# Patient Record
Sex: Male | Born: 1960 | Race: White | Hispanic: No | Marital: Married | Smoking: Former smoker
Health system: Southern US, Community
[De-identification: ages and names within clinical notes are randomized; demographics above are authoritative.]

## PROBLEM LIST (undated history)

## (undated) DIAGNOSIS — Z87442 Personal history of urinary calculi: Secondary | ICD-10-CM

## (undated) DIAGNOSIS — F419 Anxiety disorder, unspecified: Secondary | ICD-10-CM

## (undated) DIAGNOSIS — G4730 Sleep apnea, unspecified: Secondary | ICD-10-CM

## (undated) DIAGNOSIS — I10 Essential (primary) hypertension: Secondary | ICD-10-CM

## (undated) DIAGNOSIS — F329 Major depressive disorder, single episode, unspecified: Secondary | ICD-10-CM

## (undated) DIAGNOSIS — M1990 Unspecified osteoarthritis, unspecified site: Secondary | ICD-10-CM

## (undated) DIAGNOSIS — F32A Depression, unspecified: Secondary | ICD-10-CM

## (undated) HISTORY — PX: HERNIA REPAIR: SHX51

## (undated) HISTORY — PX: CARDIAC CATHETERIZATION: SHX172

## (undated) HISTORY — PX: SHOULDER ARTHROSCOPY DISTAL CLAVICLE EXCISION AND OPEN ROTATOR CUFF REPAIR: SHX2396

## (undated) HISTORY — PX: CHOLECYSTECTOMY: SHX55

## (undated) HISTORY — PX: TOTAL HIP ARTHROPLASTY: SHX124

## (undated) HISTORY — PX: JOINT REPLACEMENT: SHX530

## (undated) HISTORY — PX: BACK SURGERY: SHX140

## (undated) HISTORY — PX: FINGER SURGERY: SHX640

---

## 2003-01-10 ENCOUNTER — Emergency Department (HOSPITAL_COMMUNITY): Admission: EM | Admit: 2003-01-10 | Discharge: 2003-01-10 | Payer: Self-pay | Admitting: Emergency Medicine

## 2003-03-23 ENCOUNTER — Observation Stay (HOSPITAL_COMMUNITY): Admission: EM | Admit: 2003-03-23 | Discharge: 2003-03-24 | Payer: Self-pay | Admitting: Cardiology

## 2007-09-20 ENCOUNTER — Emergency Department: Payer: Self-pay | Admitting: Emergency Medicine

## 2008-01-20 ENCOUNTER — Ambulatory Visit (HOSPITAL_COMMUNITY): Admission: RE | Admit: 2008-01-20 | Discharge: 2008-01-21 | Payer: Self-pay | Admitting: Orthopedic Surgery

## 2009-01-22 ENCOUNTER — Encounter: Admission: RE | Admit: 2009-01-22 | Discharge: 2009-01-22 | Payer: Self-pay

## 2009-01-31 ENCOUNTER — Encounter: Admission: RE | Admit: 2009-01-31 | Discharge: 2009-01-31 | Payer: Self-pay | Admitting: Specialist

## 2009-02-08 ENCOUNTER — Ambulatory Visit (HOSPITAL_COMMUNITY): Admission: RE | Admit: 2009-02-08 | Discharge: 2009-02-08 | Payer: Self-pay | Admitting: Orthopedic Surgery

## 2009-08-11 HISTORY — PX: TOTAL HIP ARTHROPLASTY: SHX124

## 2009-12-31 ENCOUNTER — Inpatient Hospital Stay (HOSPITAL_COMMUNITY): Admission: RE | Admit: 2009-12-31 | Discharge: 2010-01-03 | Payer: Self-pay | Admitting: Orthopedic Surgery

## 2010-10-28 LAB — BASIC METABOLIC PANEL
BUN: 12 mg/dL (ref 6–23)
BUN: 15 mg/dL (ref 6–23)
CO2: 25 mEq/L (ref 19–32)
CO2: 27 mEq/L (ref 19–32)
Calcium: 9 mg/dL (ref 8.4–10.5)
Creatinine, Ser: 1.01 mg/dL (ref 0.4–1.5)
Creatinine, Ser: 1.11 mg/dL (ref 0.4–1.5)
GFR calc Af Amer: >60 mL/min (ref >60)
GFR calc non Af Amer: >60 mL/min (ref >60)
GFR calc non Af Amer: >60 mL/min (ref >60)
Glucose, Bld: 159 mg/dL — ABNORMAL HIGH (ref 70–99)
Potassium: 4.3 mEq/L (ref 3.5–5.1)
Potassium: 4.4 mEq/L (ref 3.5–5.1)
Sodium: 138 mEq/L (ref 135–145)
Sodium: 140 mEq/L (ref 135–145)

## 2010-10-28 LAB — COMPREHENSIVE METABOLIC PANEL
AST: 21 U/L (ref 0–37)
Calcium: 9.2 mg/dL (ref 8.4–10.5)
Chloride: 106 mEq/L (ref 96–112)
Creatinine, Ser: 1.09 mg/dL (ref 0.4–1.5)
GFR calc Af Amer: >60 mL/min (ref >60)
GFR calc non Af Amer: >60 mL/min (ref >60)
Glucose, Bld: 94 mg/dL (ref 70–99)
Total Protein: 7.1 g/dL (ref 6.0–8.3)

## 2010-10-28 LAB — URINALYSIS, ROUTINE W REFLEX MICROSCOPIC
Ketones, ur: NEGATIVE mg/dL
Nitrite: NEGATIVE
Urobilinogen, UA: 0.2 mg/dL (ref 0.0–1.0)

## 2010-10-28 LAB — CBC
HCT: 34.1 % — ABNORMAL LOW (ref 39.0–52.0)
HCT: 38.3 % — ABNORMAL LOW (ref 39.0–52.0)
Hemoglobin: 11 g/dL — ABNORMAL LOW (ref 13.0–17.0)
Hemoglobin: 14 g/dL (ref 13.0–17.0)
MCHC: 33 g/dL (ref 30.0–36.0)
MCHC: 33.1 g/dL (ref 30.0–36.0)
MCHC: 33.7 g/dL (ref 30.0–36.0)
MCV: 87 fL (ref 78.0–100.0)
Platelets: 215 10*3/uL (ref 150–400)
Platelets: 230 10*3/uL (ref 150–400)
RBC: 4.41 MIL/uL (ref 4.22–5.81)
RBC: 4.85 MIL/uL (ref 4.22–5.81)
RDW: 13.3 % (ref 11.5–15.5)
RDW: 13.4 % (ref 11.5–15.5)
RDW: 13.6 % (ref 11.5–15.5)
WBC: 12.7 10*3/uL — ABNORMAL HIGH (ref 4.0–10.5)
WBC: 12.9 10*3/uL — ABNORMAL HIGH (ref 4.0–10.5)
WBC: 6.6 10*3/uL (ref 4.0–10.5)

## 2010-10-28 LAB — TYPE AND SCREEN
ABO/RH(D): B NEG
Antibody Screen: NEGATIVE

## 2010-10-28 LAB — PROTIME-INR
INR: 1.05 (ref 0.00–1.49)
INR: 1.64 — ABNORMAL HIGH (ref 0.00–1.49)

## 2010-12-24 NOTE — Op Note (Signed)
Marcus Hutchinson, Marcus Hutchinson             ACCOUNT NO.:  [id number]   MEDICAL RECORD NO.:  [id number]          PATIENT TYPE:  OIB   LOCATION:  1607                         FACILITY:  [facility]   PHYSICIAN:  Marlowe Kays, M.D.  DATE OF BIRTH:  1961/02/12   DATE OF PROCEDURE:  01/20/2008  DATE OF DISCHARGE:                               OPERATIVE REPORT   PREOPERATIVE DIAGNOSES:  1. Labral tear.  2. Degenerative arthritis, acromioclavicular joint.  3. Full-thickness rotator cuff tear, right shoulder.   POSTOPERATIVE DIAGNOSES:  1. Labral tear.  2. Degenerative arthritis, acromioclavicular joint.  3. Full-thickness rotator cuff tear, right shoulder.   OPERATION:  1. Right shoulder arthroscopy with labral debridement.  2. Open distal clavicle resection.  3. Open anterior acromionectomy repair of torn rotator cuff, right      shoulder.   SURGEON:  [place].   ASSISTANT:  Mr. Idolina Primer, [facility].   ANESTHESIA:  General.   INDICATIONS FOR PROCEDURE:  The patient had an on-the-job injury to his  right shoulder with an MRI of the right shoulder on November 12, 2007,  indicating the exact pathology which we found at surgery.  Because of  his pain and the documented MRI abnormalities, he is here today for the  above mentioned treatment procedure.   DESCRIPTION OF PROCEDURE:  After satisfactory general anesthesia,  prophylactic antibiotics, beach-chair position on the Allen frame, the  right shoulder was prepped with DuraPrep and draped in a sterile field.  Time-out performed.  The anatomy of the shoulder joint was marked out,  particularly for posterior and lateral portals.  Also, the proposed  surgical incision was marked out.  Through a posterior Soft Spot portal  I atraumatically entered the glenohumeral joint, which demonstrated some  moderate labral degeneration, particularly at the region of the biceps  anchor, also the previously noted rotator cuff tear.  I advanced the  scope between the  biceps tendon and subscapularis anteriorly and using a  switching stick, made an anterior incision, over which I placed a metal  cannula and then advanced a 4.2 shaver into the joint and debrided down  the labrum.  Pre and post films were taken.  This concluded the  arthroscopic portion of the procedure.  I then made an open incision  over the distal clavicle and identifying the [facility] joint, measured 1.5 cm  medial to it and undermined the clavicle.  At this point I then  osteotomized it with a micro saw and removed the distal clavicle  fragment.  I checked the cutting of the clavicle and it was smooth.  Bone wax was applied.  I then extended the incision distal ward past the  acromion, splitting the deltoid muscle slightly.  I __________ my  initial anterior acromionectomy site obliquely on top of the acromion  and then placed a Cobb elevator beneath the anterior acromion.  After  removing my initial bone, I then undermined slightly the remainder of  the clavicle, smoothing it down with a rasp and covering it with bone  wax.  The rotator cuff tear was then identified and was about 2.5 cm  wide  with about 2 cm of retraction.  After assessing the tear, I  roughened up the humeral head just superior to the greater tuberosity  and used a Osteonics for the rotator cuff anchor, which I placed lateral  to the greater tuberosity and then used the 4 leads evenly split along  the rotator cuff, bringing the tendon back to its original position with  the arm slightly abducted.  Each of the 2 sutures was then tied and I  extended the suture laterally through the outer portion of the tendon  and the soft tissue on the lateral humerus and tying them down once  again.  I further supplemented this with some individual sutures over  the terminal portion of the rotator cuff and into the soft tissue.  This  seemed to give a nice stable repair with his arm down to his side.  I  then irrigated the wound well and  placed Gelfoam in the distal clavicle  resection site and closed the wound with interrupted #1 Vicryl in the  fascia and a slight slit in the deltoid muscle and 2-0 Vicryl in the  subcutaneous tissue, Steri-Strips on the skin.  A 4-0 nylon was also  placed into the portals.  It should also be noted he had a preoperative  interscalene block.  A dry sterile dressing and shoulder immobilizer  were applied.  He tolerated the procedure well and was taken to the  recovery room in satisfactory condition with no known complications.           ______________________________  Marlowe Kays, M.D.     JA/MEDQ  D:  01/20/2008  T:  01/20/2008  Job:  [id number]

## 2010-12-27 NOTE — Discharge Summary (Signed)
Marcus Hutchinson, Marcus Hutchinson                          ACCOUNT NO.:  [id number]   MEDICAL RECORD NO.:  [id number]                   PATIENT TYPE:  INP   LOCATION:  2008                                 FACILITY:  [facility]   PHYSICIAN:  Jesse Sans. Wall, M.D.                DATE OF BIRTH:  11-22-1960   DATE OF ADMISSION:  03/23/2003  DATE OF DISCHARGE:  03/24/2003                           DISCHARGE SUMMARY - REFERRING   PROCEDURES:  1. Cardiac catheterization.  2. Coronary arteriogram.  3. Left ventriculogram.   HOSPITAL COURSE:  Marcus Hutchinson is a 50 year old male with no known history  of coronary artery disease.  He has a positive family history of premature  coronary artery disease as well as a history of obesity, inactivity, unknown  lipid status.  He began having chest pain at rest on the Monday before  admission.  He went to [facility] where he was evaluated there by  cardiology and it was felt with his multiple risk factors a cardiac  catheterization was indicated.  He was transferred to [facility]  for further evaluation and treatment.   His enzymes were negative for MI.  The cardiac catheterization showed a  normal left main and a 20% proximal stenosis in the LAD and circumflex with  a 20% stenosis in the PDA but no other disease and no critical disease.  His  EF was normal at 60% with no wall motion abnormalities.   Marcus Hutchinson had no further episodes of chest pain or shortness of breath.  Post catheterization his groin was without ecchymosis or hematoma.  He is  pending ambulation at this time but if his bed rest is completed and he  ambulates without chest pain or shortness of breath, he is considered stable  for discharge on March 24, 2003.   LABORATORY VALUES:  INR 1.1 with a PTT of 32.   DISCHARGE CONDITION:  Stable.   DISCHARGE DIAGNOSES:  1. Chest pain, no critical coronary artery disease by catheterization.  2. Unknown lipid status with a fasting  lipid profile performed at [facility].  3. Family history of premature coronary artery disease.  4. Possible history of mild hypertension with a systolic blood pressure     generally between 90-110 at [facility].  5. Status post stress Cardiolite on March 23, 2003 showing decreased counts     in the inferior wall and inferior septum probably secondary to     diaphragmatic attenuation.  6. History of hernia surgery.   DISCHARGE INSTRUCTIONS:  1. His activity level is to include no driving, sexual or strenuous activity     for two days.  He can shower once he is at home.  2. He is to stick to a low-fat diet and make healthy dietary changes as he     has been doing with his wife.  3. He is to  call the office for problems with the cath site.  4. He is to followup with Dr. Sherril Croon and call for an appointment.  5. He has an appointment with the P.A. for Dr. Myrtis Ser on April 10, 2003 at     1:30 p.m.   DISCHARGE MEDICATIONS:  1. Coated aspirin 81 mg every day.  2. Patient was offered a medication for gastroesophageal reflux disease but     refused it.      Lavella Hammock, P.A. [facility]                  Thomas C. Wall, M.D.    RG/MEDQ  D:  03/24/2003  T:  03/24/2003  Job:  [id number]   cc:   Doreen Beam  [address]  [place]  [place] [address]  Fax: [phone]   [facility]  [place], [place]

## 2010-12-27 NOTE — Cardiovascular Report (Signed)
   Marcus Hutchinson, LAYMON NO.:  [id number]   MEDICAL RECORD NO.:  [id number]                   PATIENT TYPE:  INP   LOCATION:  2008                                 FACILITY:  [facility]   PHYSICIAN:  Carole Binning, M.D. [facility]         DATE OF BIRTH:  [birth year]   DATE OF PROCEDURE:  03/24/2003  DATE OF DISCHARGE:                              CARDIAC CATHETERIZATION   PROCEDURE PERFORMED:  Left heart catheterization with coronary angiography  and left ventriculography.   INDICATIONS:  Mr. Lynam is a 50 year old male admitted to [facility] with chest pain.  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  He was thus  referred for cardiac catheterization to rule out coronary artery disease.   PROCEDURAL NOTE:  A 6-French sheath was placed in the right femoral artery.  Coronary angiography was performed with 6-French JL4 and JR4 catheters.  Left ventriculography was performed with an angled pigtail catheter.  Contrast was Omnipaque.  There were no complications.   RESULTS:   HEMODYNAMICS:  Left ventricular pressure 116/20.  Aortic pressure 120/84.  There is no aortic valve gradient.   LEFT VENTRICULOGRAM:  Wall motion is normal.  Ejection fraction is estimated  at greater than or equal to 60%.  There is no mitral regurgitation.   CORONARY ARTERIOGRAPHY:  (Right dominant)  Left main is normal.   Left anterior descending artery has minor luminal irregularities in the  proximal vessel.  The LAD gives rise to a single normal sized diagonal  branch.   Left circumflex has minor luminal irregularities in the proximal vessel.  The circumflex gives rise to a small but normal-sized first marginal and a  large second marginal branch.   Right coronary artery is a large, dominant vessel.  It gives rise to a large  posterior descending artery and a normal sized posterolateral branch.  The  posterior descending artery has minor luminal irregularities.    IMPRESSION:  1. Normal left ventricular systolic function.  2. No significant coronary artery disease identified.                                               Carole Binning, M.D. [facility]    MWP/MEDQ  D:  03/24/2003  T:  03/24/2003  Job:  [id number]   cc:   Doreen Beam  [address]  [place]  [place] [address]  Fax: [phone]   [facility] in [place]

## 2011-05-08 LAB — HEMOGLOBIN AND HEMATOCRIT, BLOOD
HCT: 43
Hemoglobin: 14.5

## 2012-05-06 DIAGNOSIS — R079 Chest pain, unspecified: Secondary | ICD-10-CM

## 2013-08-25 ENCOUNTER — Other Ambulatory Visit: Payer: Self-pay | Admitting: Neurosurgery

## 2013-08-25 DIAGNOSIS — M545 Low back pain, unspecified: Secondary | ICD-10-CM

## 2013-08-31 ENCOUNTER — Other Ambulatory Visit: Payer: Self-pay | Admitting: Neurosurgery

## 2013-08-31 DIAGNOSIS — M545 Low back pain, unspecified: Secondary | ICD-10-CM

## 2013-09-04 ENCOUNTER — Ambulatory Visit
Admission: RE | Admit: 2013-09-04 | Discharge: 2013-09-04 | Disposition: A | Discharge status: Home / Self Care | Payer: 59 | Source: Ambulatory Visit | Attending: Neurosurgery | Admitting: Neurosurgery

## 2013-09-04 DIAGNOSIS — M545 Low back pain, unspecified: Secondary | ICD-10-CM

## 2015-03-23 ENCOUNTER — Other Ambulatory Visit: Payer: Self-pay | Admitting: Neurosurgery

## 2015-03-23 DIAGNOSIS — M5416 Radiculopathy, lumbar region: Secondary | ICD-10-CM

## 2015-03-30 ENCOUNTER — Ambulatory Visit
Admission: RE | Admit: 2015-03-30 | Discharge: 2015-03-30 | Disposition: A | Discharge status: Home / Self Care | Payer: 59 | Source: Ambulatory Visit | Attending: Neurosurgery | Admitting: Neurosurgery

## 2015-03-30 DIAGNOSIS — M5416 Radiculopathy, lumbar region: Secondary | ICD-10-CM

## 2015-03-30 MED ORDER — IOHEXOL 180 MG/ML  SOLN
1.0000 mL | Freq: Once | INTRAMUSCULAR | Status: DC | PRN
  Administered 2015-03-30: 1 mL via EPIDURAL

## 2015-03-30 MED ORDER — METHYLPREDNISOLONE ACETATE 40 MG/ML INJ SUSP (RADIOLOG
120.0000 mg | Freq: Once | INTRAMUSCULAR | Status: AC
Start: 2015-03-30 — End: 2015-03-30
  Administered 2015-03-30: 120 mg via EPIDURAL

## 2015-03-30 NOTE — Discharge Instructions (Signed)

## 2015-05-07 ENCOUNTER — Other Ambulatory Visit: Payer: Self-pay | Admitting: Surgical

## 2015-05-07 DIAGNOSIS — M1612 Unilateral primary osteoarthritis, left hip: Secondary | ICD-10-CM

## 2015-05-11 ENCOUNTER — Ambulatory Visit
Admission: RE | Admit: 2015-05-11 | Discharge: 2015-05-11 | Disposition: A | Discharge status: Home / Self Care | Payer: 59 | Source: Ambulatory Visit | Attending: Surgical | Admitting: Surgical

## 2015-05-11 DIAGNOSIS — M1612 Unilateral primary osteoarthritis, left hip: Secondary | ICD-10-CM

## 2015-05-11 MED ORDER — METHYLPREDNISOLONE ACETATE 40 MG/ML INJ SUSP (RADIOLOG
120.0000 mg | Freq: Once | INTRAMUSCULAR | Status: AC
  Administered 2015-05-11: 120 mg via INTRA_ARTICULAR

## 2015-05-11 MED ORDER — IOHEXOL 180 MG/ML  SOLN
1.0000 mL | Freq: Once | INTRAMUSCULAR | Status: DC | PRN
  Administered 2015-05-11: 1 mL via INTRA_ARTICULAR

## 2015-06-11 ENCOUNTER — Other Ambulatory Visit: Payer: Self-pay | Admitting: Orthopedic Surgery

## 2015-06-11 DIAGNOSIS — M25552 Pain in left hip: Secondary | ICD-10-CM

## 2015-06-12 ENCOUNTER — Ambulatory Visit
Admission: RE | Admit: 2015-06-12 | Discharge: 2015-06-12 | Disposition: A | Discharge status: Home / Self Care | Payer: 59 | Source: Ambulatory Visit | Attending: Orthopedic Surgery | Admitting: Orthopedic Surgery

## 2015-06-12 DIAGNOSIS — M25552 Pain in left hip: Secondary | ICD-10-CM

## 2015-10-11 ENCOUNTER — Ambulatory Visit: Payer: Self-pay | Admitting: Orthopedic Surgery

## 2015-10-11 NOTE — Progress Notes (Signed)
Preoperative surgical orders have been place into the Epic hospital system for Marcus Hutchinson on 10/11/2015, 10:29 AM  by Mickel Crow for surgery on 11-07-15.  Preop Total Hip orders including IV Tylenol, and IV Decadron as long as there are no contraindications to the above medications. [name], PA-C

## 2015-11-01 ENCOUNTER — Encounter (HOSPITAL_COMMUNITY)
Admission: RE | Admit: 2015-11-01 | Discharge: 2015-11-01 | Disposition: A | Discharge status: Home / Self Care | Payer: 59 | Source: Ambulatory Visit | Attending: Orthopedic Surgery | Admitting: Orthopedic Surgery

## 2015-11-01 ENCOUNTER — Encounter (HOSPITAL_COMMUNITY): Payer: Self-pay

## 2015-11-01 DIAGNOSIS — Z01812 Encounter for preprocedural laboratory examination: Secondary | ICD-10-CM | POA: Insufficient documentation

## 2015-11-01 DIAGNOSIS — I10 Essential (primary) hypertension: Secondary | ICD-10-CM | POA: Insufficient documentation

## 2015-11-01 DIAGNOSIS — Z01810 Encounter for preprocedural cardiovascular examination: Secondary | ICD-10-CM | POA: Diagnosis present

## 2015-11-01 HISTORY — DX: Sleep apnea, unspecified: G47.30

## 2015-11-01 HISTORY — DX: Unspecified osteoarthritis, unspecified site: M19.90

## 2015-11-01 HISTORY — DX: Essential (primary) hypertension: I10

## 2015-11-01 HISTORY — DX: Personal history of urinary calculi: Z87.442

## 2015-11-01 LAB — URINALYSIS, ROUTINE W REFLEX MICROSCOPIC
Bilirubin Urine: NEGATIVE
Glucose, UA: NEGATIVE mg/dL
Hgb urine dipstick: NEGATIVE
KETONES UR: NEGATIVE mg/dL
LEUKOCYTES UA: NEGATIVE
NITRITE: NEGATIVE
PH: 5.5 (ref 5.0–8.0)
Protein, ur: NEGATIVE mg/dL
SPECIFIC GRAVITY, URINE: 1.021 (ref 1.005–1.030)

## 2015-11-01 LAB — COMPREHENSIVE METABOLIC PANEL
ALK PHOS: 61 U/L (ref 38–126)
ALT: 37 U/L (ref 17–63)
AST: 30 U/L (ref 15–41)
Albumin: 3.9 g/dL (ref 3.5–5.0)
Anion gap: 10 (ref 5–15)
BILIRUBIN TOTAL: 0.7 mg/dL (ref 0.3–1.2)
BUN: 17 mg/dL (ref 6–20)
CO2: 24 mmol/L (ref 22–32)
CREATININE: 1.21 mg/dL (ref 0.61–1.24)
Calcium: 9.3 mg/dL (ref 8.9–10.3)
Chloride: 110 mmol/L (ref 101–111)
GFR calc Af Amer: >60 mL/min (ref >60)
Glucose, Bld: 92 mg/dL (ref 65–99)
Potassium: 4.4 mmol/L (ref 3.5–5.1)
Sodium: 144 mmol/L (ref 135–145)
TOTAL PROTEIN: 7.2 g/dL (ref 6.5–8.1)

## 2015-11-01 LAB — CBC
HEMATOCRIT: 44.6 % (ref 39.0–52.0)
Hemoglobin: 13.9 g/dL (ref 13.0–17.0)
MCH: 26.7 pg (ref 26.0–34.0)
MCHC: 31.2 g/dL (ref 30.0–36.0)
MCV: 85.8 fL (ref 78.0–100.0)
Platelets: 228 10*3/uL (ref 150–400)
RBC: 5.2 MIL/uL (ref 4.22–5.81)
RDW: 14.2 % (ref 11.5–15.5)
WBC: 6.7 10*3/uL (ref 4.0–10.5)

## 2015-11-01 LAB — PROTIME-INR
INR: 1.16 (ref 0.00–1.49)
PROTHROMBIN TIME: 14.5 s (ref 11.6–15.2)

## 2015-11-01 LAB — APTT: aPTT: 31 seconds (ref 24–37)

## 2015-11-01 LAB — SURGICAL PCR SCREEN
MRSA, PCR: NEGATIVE
STAPHYLOCOCCUS AUREUS: NEGATIVE

## 2015-11-01 NOTE — Patient Instructions (Addendum)
Marcus Hutchinson  11/01/2015   Your procedure is scheduled on: 11-07-15  Report to [facility] Main  Entrance take [facility]  elevators to 3rd floor to  [name] at  1130 AM.  Call this number if you have problems the morning of surgery [phone]   Remember: ONLY 1 PERSON MAY GO WITH YOU TO SHORT STAY TO GET  READY MORNING OF [name].  Do not eat food or drink liquids :After Midnight. Exception -may have Clear Liquids 12 midnight to 0700 AM, then nothing.   CLEAR LIQUID DIET   Foods Allowed                                                                     Foods Excluded  Coffee and tea, regular and decaf                             liquids that you cannot  Plain Jell-O in any flavor                                             see through such as: Fruit ices (not with fruit pulp)                                     milk, soups, orange juice  Iced Popsicles                                    All solid food Carbonated beverages, regular and diet                                    Cranberry, grape and apple juices Sports drinks like Gatorade Lightly seasoned clear broth or consume(fat free) Sugar, honey syrup       Take these medicines the morning of surgery with A SIP OF WATER: Metoprolol. Bring cpap mask/tubing. DO NOT TAKE ANY DIABETIC MEDICATIONS DAY OF YOUR SURGERY                               You may not have any metal on your body including hair pins and              piercings  Do not wear jewelry, make-up, lotions, powders or perfumes, deodorant             Do not wear nail polish.  Do not shave  48 hours prior to surgery.              Men may shave face and neck.   Do not bring valuables to the hospital. [name]  FOR VALUABLES.  Contacts, dentures or bridgework may not be worn into surgery.  Leave suitcase in the car. After surgery it may be brought to your room.     Patients discharged the day  of surgery will not be allowed to drive home.  Name and phone number of your driver: Marcus Hutchinson- spouse [phone] cell  Special Instructions: N/A              Please read over the following fact sheets you were given: _____________________________________________________________________             [name] - Preparing for Surgery Before surgery, you can play an important role.  Because skin is not sterile, your skin needs to be as free of germs as possible.  You can reduce the number of germs on your skin by washing with CHG (chlorahexidine gluconate) soap before surgery.  CHG is an antiseptic cleaner which kills germs and bonds with the skin to continue killing germs even after washing. Please DO NOT use if you have an allergy to CHG or antibacterial soaps.  If your skin becomes reddened/irritated stop using the CHG and inform your nurse when you arrive at Short Stay. Do not shave (including legs and underarms) for at least 48 hours prior to the first CHG shower.  You may shave your face/neck. Please follow these instructions carefully:  1.  Shower with CHG Soap the night before surgery and the  morning of Surgery.  2.  If you choose to wash your hair, wash your hair first as usual with your  normal  shampoo.  3.  After you shampoo, rinse your hair and body thoroughly to remove the  shampoo.                           4.  Use CHG as you would any other liquid soap.  You can apply chg directly  to the skin and wash                       Gently with a scrungie or clean washcloth.  5.  Apply the CHG Soap to your body ONLY FROM THE NECK DOWN.   Do not use on face/ open                           Wound or open sores. Avoid contact with eyes, ears mouth and genitals (private parts).                       Wash face,  Genitals (private parts) with your normal soap.             6.  Wash thoroughly, paying special attention to the area where your surgery  will be performed.  7.  Thoroughly rinse your  body with warm water from the neck down.  8.  DO NOT shower/wash with your normal soap after using and rinsing off  the CHG Soap.                9.  Pat yourself dry with a clean towel.            10.  Wear clean pajamas.            11.  Place clean sheets on your bed the night of your first shower and do not  sleep with pets. Day of Surgery :  Do not apply any lotions/deodorants the morning of surgery.  Please wear clean clothes to the hospital/surgery center.  FAILURE TO FOLLOW THESE INSTRUCTIONS MAY RESULT IN THE CANCELLATION OF YOUR SURGERY PATIENT SIGNATURE_________________________________  NURSE SIGNATURE__________________________________  ________________________________________________________________________   Marcus Hutchinson  An incentive spirometer is a tool that can help keep your lungs clear and active. This tool measures how well you are filling your lungs with each breath. Taking long deep breaths may help reverse or decrease the chance of developing breathing (pulmonary) problems (especially infection) following:  A long period of time when you are unable to move or be active. BEFORE THE PROCEDURE   If the spirometer includes an indicator to show your best effort, your nurse or respiratory therapist will set it to a desired goal.  If possible, sit up straight or lean slightly forward. Try not to slouch.  Hold the incentive spirometer in an upright position. INSTRUCTIONS FOR USE   Sit on the edge of your bed if possible, or sit up as far as you can in bed or on a chair.  Hold the incentive spirometer in an upright position.  Breathe out normally.  Place the mouthpiece in your mouth and seal your lips tightly around it.  Breathe in slowly and as deeply as possible, raising the piston or the ball toward the top of the column.  Hold your breath for 3-5 seconds or for as long as possible. Allow the piston or ball to fall to the bottom of the column.  Remove  the mouthpiece from your mouth and breathe out normally.  Rest for a few seconds and repeat Steps 1 through 7 at least 10 times every 1-2 hours when you are awake. Take your time and take a few normal breaths between deep breaths.  The spirometer may include an indicator to show your best effort. Use the indicator as a goal to work toward during each repetition.  After each set of 10 deep breaths, practice coughing to be sure your lungs are clear. If you have an incision (the cut made at the time of surgery), support your incision when coughing by placing a pillow or rolled up towels firmly against it. Once you are able to get out of bed, walk around indoors and cough well. You may stop using the incentive spirometer when instructed by your caregiver.  RISKS AND COMPLICATIONS  Take your time so you do not get dizzy or light-headed.  If you are in pain, you may need to take or ask for pain medication before doing incentive spirometry. It is harder to take a deep breath if you are having pain. AFTER USE  Rest and breathe slowly and easily.  It can be helpful to keep track of a log of your progress. Your caregiver can provide you with a simple table to help with this. If you are using the spirometer at home, follow these instructions: [place] IF:   You are having difficultly using the spirometer.  You have trouble using the spirometer as often as instructed.  Your pain medication is not giving enough relief while using the spirometer.  You develop fever of 100.5 F (38.1 C) or higher. SEEK IMMEDIATE MEDICAL CARE IF:   You cough up bloody sputum that had not been present before.  You develop fever of 102 F (38.9 C) or greater.  You develop worsening pain at or near the incision site. MAKE SURE YOU:   Understand these instructions.  Will watch your condition.  Will get help right away if you are not doing well or get worse. Document Released: 12/08/2006 Document  Revised: 10/20/2011 Document Reviewed: 02/08/2007 ExitCare Patient Information 2014 ExitCare, [place].   ________________________________________________________________________  WHAT IS A BLOOD TRANSFUSION? Blood Transfusion Information  A transfusion is the replacement of blood or some of its parts. Blood is made up of multiple cells which provide different functions.  Red blood cells carry oxygen and are used for blood loss replacement.  White blood cells fight against infection.  Platelets control bleeding.  Plasma helps clot blood.  Other blood products are available for specialized needs, such as hemophilia or other clotting disorders. BEFORE THE TRANSFUSION  Who gives blood for transfusions?   Healthy volunteers who are fully evaluated to make sure their blood is safe. This is blood bank blood. Transfusion therapy is the safest it has ever been in the practice of medicine. Before blood is taken from a donor, a complete history is taken to make sure that person has no history of diseases nor engages in risky social behavior (examples are intravenous drug use or sexual activity with multiple partners). The donor's travel history is screened to minimize risk of transmitting infections, such as malaria. The donated blood is tested for signs of infectious diseases, such as HIV and hepatitis. The blood is then tested to be sure it is compatible with you in order to minimize the chance of a transfusion reaction. If you or a relative donates blood, this is often done in anticipation of surgery and is not appropriate for emergency situations. It takes many days to process the donated blood. RISKS AND COMPLICATIONS Although transfusion therapy is very safe and saves many lives, the main dangers of transfusion include:   Getting an infectious disease.  Developing a transfusion reaction. This is an allergic reaction to something in the blood you were given. Every precaution is taken to prevent  this. The decision to have a blood transfusion has been considered carefully by your caregiver before blood is given. Blood is not given unless the benefits outweigh the risks. AFTER THE TRANSFUSION  Right after receiving a blood transfusion, you will usually feel much better and more energetic. This is especially true if your red blood cells have gotten low (anemic). The transfusion raises the level of the red blood cells which carry oxygen, and this usually causes an energy increase.  The nurse administering the transfusion will monitor you carefully for complications. HOME CARE INSTRUCTIONS  No special instructions are needed after a transfusion. You may find your energy is better. Speak with your caregiver about any limitations on activity for underlying diseases you may have. SEEK MEDICAL CARE IF:   Your condition is not improving after your transfusion.  You develop redness or irritation at the intravenous (IV) site. SEEK IMMEDIATE MEDICAL CARE IF:  Any of the following symptoms occur over the next 12 hours:  Shaking chills.  You have a temperature by mouth above 102 F (38.9 C), not controlled by medicine.  Chest, back, or muscle pain.  People around you feel you are not acting correctly or are confused.  Shortness of breath or difficulty breathing.  Dizziness and fainting.  You get a rash or develop hives.  You have a decrease in urine output.  Your urine turns a dark color or changes to pink, red, or brown. Any of the following symptoms occur over the next 10 days:  You have a temperature by mouth above 102 F (38.9 C), not controlled  by medicine.  Shortness of breath.  Weakness after normal activity.  The white part of the eye turns yellow (jaundice).  You have a decrease in the amount of urine or are urinating less often.  Your urine turns a dark color or changes to pink, red, or brown. Document Released: 07/25/2000 Document Revised: 10/20/2011 Document  Reviewed: 03/13/2008 [facility] Patient Information 2014 [place], [place].  _______________________________________________________________________

## 2015-11-01 NOTE — Pre-Procedure Instructions (Signed)
11-01-15 Ekg done today. Dr. Gifford Shave, anesthesiologist in to see.

## 2015-11-06 ENCOUNTER — Ambulatory Visit: Payer: Self-pay | Admitting: Orthopedic Surgery

## 2015-11-06 MED ORDER — DEXTROSE 5 % IV SOLN
3.0000 g | INTRAVENOUS | Status: AC
  Administered 2015-11-07: 3 g via INTRAVENOUS
  Filled 2015-11-06: qty 3000

## 2015-11-06 NOTE — H&P (Signed)
Marcus Hutchinson DOB: 07-Jul-1961 Married / Language: English / Race: White Male Date of Admission:  11/07/2015 CC:  Left Hip Pain History of Present Illness The patient is a 55 year old male who comes in for a preoperative History and Physical. The patient is scheduled for a left total hip arthroplasty (posterior) to be performed by Dr. Dione Plover. Aluisio, MD at [facility] on 11-07-2015. The patient is a 55 year old male who presented for follow up of their hip. The patient is being followed for their left hip pain. Symptoms reported include: pain, stiffness, pain with weightbearing and difficulty ambulating. The patient feels that they are doing poorly and report their pain level to be moderate to severe. The following medication has been used for pain control: Hydrocodone (PRN). The patient presents today following MRI (left hip MRI without contrast. Date of study: 06/12/15). He did have the IA hip injection at [facility] imaging but it did nto help any. He states that his pain has increased since the injection. He has been unable to work and the hip locks up on him. He states that he has been unable to work for a couple of weeks. He wants surgery. Unfortunately, his pain has not gotten any better. He has a stress reaction in the femoral head and neck area. He also had degenerative change with cystic changes in his acetabulum. He states that his hip hurts as bad now as it did several weeks ago. He is at a stage where he feels like he needs to get something done with it. He is ready to proceed with surgery. They have been treated conservatively in the past for the above stated problem and despite conservative measures, they continue to have progressive pain and severe functional limitations and dysfunction. They have failed non-operative management including home exercise, medications, and injections. It is felt that they would benefit from undergoing total joint replacement. Risks and benefits of the  procedure have been discussed with the patient and they elect to proceed with surgery. There are no active contraindications to surgery such as ongoing infection or rapidly progressive neurological disease.   Problem List/Past Medical  Status post right hip replacement [id number])  Primary osteoarthritis of left hip (M16.12)  Stress fracture of left femur with routine healing (M84.352D)  High blood pressure  Migraine Headache  Sleep Apnea  uses CPAP  Allergies No Known Drug Allergies Adhesive Bandages  He did tolerate the Steri-strips with his last hip surgey.  Family History Congestive Heart Failure  Maternal Grandfather. Diabetes Mellitus  Mother. Heart Disease  Father, Maternal Grandfather. Hypertension  Father, Mother.  Social History Children  4 Current work status  working full time Exercise  Exercises weekly; does running / walking Living situation  live with spouse Marital status  married Never consumed alcohol  04/30/2015: Never consumed alcohol No history of drug/alcohol rehab  Not under pain contract  Number of flights of stairs before winded  4-5 Tobacco / smoke exposure  04/30/2015: yes outdoors only Tobacco use  Never smoker. 04/30/2015  Medication History  Meloxicam (15MG  Tablet, 1 (one) Oral po qd with food, Taken starting 09/24/2015) Active. Metoprolol Succinate ER (25MG  Tablet ER 24HR, Oral) Active. Aspirin (81MG  Tablet, Oral) Active.  Past Surgical History Gallbladder Surgery  laporoscopic Total Hip Replacement  right Right Rotator Cuff Repair    Review of Systems  General Not Present- Chills, Fatigue, Fever, Memory Loss, Night Sweats, Weight Gain and Weight Loss. Skin Not Present- Eczema, Hives, Itching,  Lesions and Rash. HEENT Not Present- Dentures, Double Vision, Headache, Hearing Loss, Tinnitus and Visual Loss. Respiratory Not Present- Allergies, Chronic Cough, Coughing up blood, Shortness of breath at rest and  Shortness of breath with exertion. Cardiovascular Not Present- Chest Pain, Difficulty Breathing Lying Down, Murmur, Palpitations, Racing/skipping heartbeats and Swelling. Gastrointestinal Not Present- Abdominal Pain, Bloody Stool, Constipation, Diarrhea, Difficulty Swallowing, Heartburn, Jaundice, Loss of appetitie, Nausea and Vomiting. Male Genitourinary Not Present- Blood in Urine, Discharge, Flank Pain, Incontinence, Painful Urination, Urgency, Urinary frequency, Urinary Retention, Urinating at Night and Weak urinary stream. Musculoskeletal Present- Joint Pain (left hip). Not Present- Back Pain, Joint Swelling, Morning Stiffness, Muscle Pain, Muscle Weakness and Spasms. Neurological Not Present- Blackout spells, Difficulty with balance, Dizziness, Paralysis, Tremor and Weakness. Psychiatric Not Present- Insomnia.  Vitals Weight: 370 lb Height: 73in Weight was reported by patient. Height was reported by patient. Body Surface Area: 2.79 m Body Mass Index: 48.82 kg/m  Pulse: 68 (Regular)  BP: 126/74 (Sitting, Right Arm, Standard)  Physical Exam General Mental Status -Alert, cooperative and good historian. General Appearance-pleasant, Not in acute distress. Orientation-Oriented X3. Build & Nutrition-Well nourished and Well developed.  Head and Neck Head-normocephalic, atraumatic . Neck Global Assessment - supple, no bruit auscultated on the right, no bruit auscultated on the left.  Eye Vision-Wears corrective lenses. Pupil - Bilateral-Regular and Round. Motion - Bilateral-EOMI.  Chest and Lung Exam Auscultation Breath sounds - clear at anterior chest wall and clear at posterior chest wall. Adventitious sounds - No Adventitious sounds.  Cardiovascular Auscultation Rhythm - Regular rate and rhythm. Heart Sounds - S1 WNL and S2 WNL. Murmurs & Other Heart Sounds - Auscultation of the heart reveals - No Murmurs.  Abdomen Inspection Contour - Generalized  moderate distention. Palpation/Percussion Tenderness - Abdomen is non-tender to palpation. Rigidity (guarding) - Abdomen is soft. Auscultation Auscultation of the abdomen reveals - Bowel sounds normal.  Male Genitourinary Note: Not done, not pertinent to present illness   Musculoskeletal Note: He is alert and oriented, no apparent distress. His left hip shows flexion to about 100, rotation in 10, out 30, abduction 30 with discomfort on range of motion.  Assessment & Plan Stress fracture of left femur with routine healing (M84.352D) Primary osteoarthritis of left hip (M16.12)  Left Total Hip Replacement - Posterior Approach  Disposition: Home  PCP: Dr. Woody Seller  IV TXA  Anesthesia Issues: None  Signed electronically by Ok Edwards, III PA-C

## 2015-11-07 ENCOUNTER — Inpatient Hospital Stay (HOSPITAL_COMMUNITY): Payer: 59 | Admitting: Anesthesiology

## 2015-11-07 ENCOUNTER — Inpatient Hospital Stay (HOSPITAL_COMMUNITY): Payer: 59

## 2015-11-07 ENCOUNTER — Encounter (HOSPITAL_COMMUNITY)
Admission: RE | Disposition: A | Discharge status: Home Health | Payer: Self-pay | Source: Ambulatory Visit | Attending: Orthopedic Surgery

## 2015-11-07 ENCOUNTER — Inpatient Hospital Stay (HOSPITAL_COMMUNITY)
Admission: RE | Admit: 2015-11-07 | Discharge: 2015-11-09 | DRG: 470 | Disposition: A | Discharge status: Home Health | Payer: 59 | Source: Ambulatory Visit | Attending: Orthopedic Surgery | Admitting: Orthopedic Surgery

## 2015-11-07 ENCOUNTER — Encounter (HOSPITAL_COMMUNITY): Payer: Self-pay | Admitting: *Deleted

## 2015-11-07 DIAGNOSIS — Z8249 Family history of ischemic heart disease and other diseases of the circulatory system: Secondary | ICD-10-CM

## 2015-11-07 DIAGNOSIS — Z833 Family history of diabetes mellitus: Secondary | ICD-10-CM

## 2015-11-07 DIAGNOSIS — I10 Essential (primary) hypertension: Secondary | ICD-10-CM | POA: Diagnosis present

## 2015-11-07 DIAGNOSIS — Z6841 Body Mass Index (BMI) 40.0 and over, adult: Secondary | ICD-10-CM | POA: Diagnosis not present

## 2015-11-07 DIAGNOSIS — X58XXXA Exposure to other specified factors, initial encounter: Secondary | ICD-10-CM | POA: Diagnosis present

## 2015-11-07 DIAGNOSIS — M84352D Stress fracture, left femur, subsequent encounter for fracture with routine healing: Secondary | ICD-10-CM | POA: Diagnosis present

## 2015-11-07 DIAGNOSIS — M4800 Spinal stenosis, site unspecified: Secondary | ICD-10-CM | POA: Diagnosis present

## 2015-11-07 DIAGNOSIS — Z96643 Presence of artificial hip joint, bilateral: Secondary | ICD-10-CM | POA: Diagnosis present

## 2015-11-07 DIAGNOSIS — M169 Osteoarthritis of hip, unspecified: Secondary | ICD-10-CM | POA: Diagnosis present

## 2015-11-07 DIAGNOSIS — G43909 Migraine, unspecified, not intractable, without status migrainosus: Secondary | ICD-10-CM | POA: Diagnosis present

## 2015-11-07 DIAGNOSIS — Z96649 Presence of unspecified artificial hip joint: Secondary | ICD-10-CM

## 2015-11-07 DIAGNOSIS — M1612 Unilateral primary osteoarthritis, left hip: Secondary | ICD-10-CM | POA: Diagnosis present

## 2015-11-07 DIAGNOSIS — G4730 Sleep apnea, unspecified: Secondary | ICD-10-CM | POA: Diagnosis present

## 2015-11-07 HISTORY — PX: TOTAL HIP ARTHROPLASTY: SHX124

## 2015-11-07 LAB — TYPE AND SCREEN
ABO/RH(D): B NEG
Antibody Screen: NEGATIVE

## 2015-11-07 SURGERY — ARTHROPLASTY, HIP, TOTAL,POSTERIOR APPROACH
Anesthesia: General | Site: Hip | Laterality: Left

## 2015-11-07 MED ORDER — MENTHOL 3 MG MT LOZG: 1.0000 | LOZENGE | OROMUCOSAL | Status: DC | PRN

## 2015-11-07 MED ORDER — LIDOCAINE HCL (CARDIAC) 20 MG/ML IV SOLN
INTRAVENOUS | Status: DC | PRN
  Administered 2015-11-07: 100 mg via INTRATRACHEAL

## 2015-11-07 MED ORDER — SODIUM CHLORIDE 0.9 % IV SOLN
INTRAVENOUS | Status: DC
  Administered 2015-11-07: 20:00:00 via INTRAVENOUS

## 2015-11-07 MED ORDER — DEXAMETHASONE SODIUM PHOSPHATE 10 MG/ML IJ SOLN
10.0000 mg | Freq: Once | INTRAMUSCULAR | Status: AC
  Administered 2015-11-08: 10 mg via INTRAVENOUS
  Filled 2015-11-07: qty 1

## 2015-11-07 MED ORDER — METOPROLOL SUCCINATE ER 25 MG PO TB24
25.0000 mg | ORAL_TABLET | Freq: Every day | ORAL | Status: DC
  Administered 2015-11-07: 25 mg via ORAL
  Filled 2015-11-07: qty 1

## 2015-11-07 MED ORDER — ACETAMINOPHEN 10 MG/ML IV SOLN
INTRAVENOUS | Status: AC
  Filled 2015-11-07: qty 100

## 2015-11-07 MED ORDER — MORPHINE SULFATE (PF) 2 MG/ML IV SOLN
1.0000 mg | INTRAVENOUS | Status: DC | PRN
  Administered 2015-11-07: 1 mg via INTRAVENOUS
  Filled 2015-11-07: qty 1

## 2015-11-07 MED ORDER — OXYCODONE HCL 5 MG PO TABS
5.0000 mg | ORAL_TABLET | ORAL | Status: DC | PRN
  Administered 2015-11-07 – 2015-11-09 (×11): 10 mg via ORAL
  Filled 2015-11-07 (×11): qty 2

## 2015-11-07 MED ORDER — EPHEDRINE SULFATE 50 MG/ML IJ SOLN
INTRAMUSCULAR | Status: DC | PRN
  Administered 2015-11-07: 5 mg via INTRAVENOUS
  Administered 2015-11-07: 10 mg via INTRAVENOUS
  Administered 2015-11-07: 5 mg via INTRAVENOUS

## 2015-11-07 MED ORDER — CEFAZOLIN SODIUM-DEXTROSE 2-4 GM/100ML-% IV SOLN
2.0000 g | Freq: Four times a day (QID) | INTRAVENOUS | Status: AC
  Administered 2015-11-07 – 2015-11-08 (×2): 2 g via INTRAVENOUS
  Filled 2015-11-07 (×2): qty 100

## 2015-11-07 MED ORDER — ACETAMINOPHEN 650 MG RE SUPP: 650.0000 mg | Freq: Four times a day (QID) | RECTAL | Status: DC | PRN

## 2015-11-07 MED ORDER — ONDANSETRON HCL 4 MG PO TABS: 4.0000 mg | ORAL_TABLET | Freq: Four times a day (QID) | ORAL | Status: DC | PRN

## 2015-11-07 MED ORDER — SUCCINYLCHOLINE CHLORIDE 20 MG/ML IJ SOLN
INTRAMUSCULAR | Status: DC | PRN
  Administered 2015-11-07: 100 mg via INTRAVENOUS

## 2015-11-07 MED ORDER — PROPOFOL 10 MG/ML IV BOLUS
INTRAVENOUS | Status: DC | PRN
Start: 2015-11-07 — End: 2015-11-07
  Administered 2015-11-07: 200 mg via INTRAVENOUS

## 2015-11-07 MED ORDER — PROPOFOL 10 MG/ML IV BOLUS
INTRAVENOUS | Status: AC
  Filled 2015-11-07: qty 20

## 2015-11-07 MED ORDER — ACETAMINOPHEN 325 MG PO TABS: 650.0000 mg | ORAL_TABLET | Freq: Four times a day (QID) | ORAL | Status: DC | PRN

## 2015-11-07 MED ORDER — MIDAZOLAM HCL 5 MG/5ML IJ SOLN
INTRAMUSCULAR | Status: DC | PRN
  Administered 2015-11-07: 2 mg via INTRAVENOUS

## 2015-11-07 MED ORDER — ACETAMINOPHEN 10 MG/ML IV SOLN
1000.0000 mg | Freq: Once | INTRAVENOUS | Status: AC
  Administered 2015-11-07: 1000 mg via INTRAVENOUS
  Filled 2015-11-07: qty 100

## 2015-11-07 MED ORDER — EPHEDRINE SULFATE 50 MG/ML IJ SOLN
INTRAMUSCULAR | Status: AC
  Filled 2015-11-07: qty 1

## 2015-11-07 MED ORDER — METOPROLOL SUCCINATE ER 25 MG PO TB24
25.0000 mg | ORAL_TABLET | Freq: Every day | ORAL | Status: DC
  Administered 2015-11-08 – 2015-11-09 (×2): 25 mg via ORAL
  Filled 2015-11-07 (×2): qty 1

## 2015-11-07 MED ORDER — MIDAZOLAM HCL 2 MG/2ML IJ SOLN
INTRAMUSCULAR | Status: AC
  Filled 2015-11-07: qty 2

## 2015-11-07 MED ORDER — TRAMADOL HCL 50 MG PO TABS
50.0000 mg | ORAL_TABLET | Freq: Four times a day (QID) | ORAL | Status: DC | PRN
Start: 2015-11-07 — End: 2015-11-09

## 2015-11-07 MED ORDER — DIPHENHYDRAMINE HCL 12.5 MG/5ML PO ELIX: 12.5000 mg | ORAL_SOLUTION | ORAL | Status: DC | PRN

## 2015-11-07 MED ORDER — CHLORHEXIDINE GLUCONATE 4 % EX LIQD: 60.0000 mL | Freq: Once | CUTANEOUS | Status: DC

## 2015-11-07 MED ORDER — LACTATED RINGERS IV SOLN
INTRAVENOUS | Status: DC | PRN
  Administered 2015-11-07 (×2): via INTRAVENOUS

## 2015-11-07 MED ORDER — BUPIVACAINE HCL (PF) 0.25 % IJ SOLN
INTRAMUSCULAR | Status: AC
  Filled 2015-11-07: qty 30

## 2015-11-07 MED ORDER — LIDOCAINE HCL (CARDIAC) 20 MG/ML IV SOLN
INTRAVENOUS | Status: AC
  Filled 2015-11-07: qty 5

## 2015-11-07 MED ORDER — HYDROMORPHONE HCL 1 MG/ML IJ SOLN
0.2500 mg | INTRAMUSCULAR | Status: DC | PRN
  Administered 2015-11-07 (×4): 0.5 mg via INTRAVENOUS

## 2015-11-07 MED ORDER — STERILE WATER FOR IRRIGATION IR SOLN
Status: DC | PRN
  Administered 2015-11-07: 1000 mL

## 2015-11-07 MED ORDER — LACTATED RINGERS IV SOLN
INTRAVENOUS | Status: DC
  Administered 2015-11-07: 1000 mL via INTRAVENOUS

## 2015-11-07 MED ORDER — PHENOL 1.4 % MT LIQD: 1.0000 | OROMUCOSAL | Status: DC | PRN

## 2015-11-07 MED ORDER — TRANEXAMIC ACID 1000 MG/10ML IV SOLN
1000.0000 mg | Freq: Once | INTRAVENOUS | Status: AC
  Administered 2015-11-07: 1000 mg via INTRAVENOUS
  Filled 2015-11-07: qty 10

## 2015-11-07 MED ORDER — POLYETHYLENE GLYCOL 3350 17 G PO PACK: 17.0000 g | PACK | Freq: Every day | ORAL | Status: DC | PRN

## 2015-11-07 MED ORDER — TRANEXAMIC ACID 1000 MG/10ML IV SOLN
1000.0000 mg | INTRAVENOUS | Status: AC
  Administered 2015-11-07: 1000 mg via INTRAVENOUS
  Filled 2015-11-07: qty 10

## 2015-11-07 MED ORDER — SODIUM CHLORIDE 0.9 % IV SOLN: INTRAVENOUS | Status: DC

## 2015-11-07 MED ORDER — RIVAROXABAN 10 MG PO TABS
10.0000 mg | ORAL_TABLET | Freq: Every day | ORAL | Status: DC
  Administered 2015-11-08 – 2015-11-09 (×2): 10 mg via ORAL
  Filled 2015-11-07 (×3): qty 1

## 2015-11-07 MED ORDER — METOCLOPRAMIDE HCL 10 MG PO TABS: 5.0000 mg | ORAL_TABLET | Freq: Three times a day (TID) | ORAL | Status: DC | PRN

## 2015-11-07 MED ORDER — HYDROMORPHONE HCL 1 MG/ML IJ SOLN
INTRAMUSCULAR | Status: AC
Start: 2015-11-07 — End: 2015-11-08
  Filled 2015-11-07: qty 1

## 2015-11-07 MED ORDER — DOCUSATE SODIUM 100 MG PO CAPS
100.0000 mg | ORAL_CAPSULE | Freq: Two times a day (BID) | ORAL | Status: DC
  Administered 2015-11-08: 100 mg via ORAL

## 2015-11-07 MED ORDER — HYDROMORPHONE HCL 1 MG/ML IJ SOLN
INTRAMUSCULAR | Status: AC
  Filled 2015-11-07: qty 1

## 2015-11-07 MED ORDER — ONDANSETRON HCL 4 MG/2ML IJ SOLN
INTRAMUSCULAR | Status: DC | PRN
  Administered 2015-11-07: 4 mg via INTRAVENOUS

## 2015-11-07 MED ORDER — FENTANYL CITRATE (PF) 250 MCG/5ML IJ SOLN
INTRAMUSCULAR | Status: DC | PRN
  Administered 2015-11-07 (×3): 50 ug via INTRAVENOUS
  Administered 2015-11-07 (×4): 25 ug via INTRAVENOUS
  Administered 2015-11-07: 100 ug via INTRAVENOUS

## 2015-11-07 MED ORDER — DEXAMETHASONE SODIUM PHOSPHATE 10 MG/ML IJ SOLN
INTRAMUSCULAR | Status: AC
  Filled 2015-11-07: qty 1

## 2015-11-07 MED ORDER — ONDANSETRON HCL 4 MG/2ML IJ SOLN: 4.0000 mg | Freq: Four times a day (QID) | INTRAMUSCULAR | Status: DC | PRN

## 2015-11-07 MED ORDER — METHOCARBAMOL 1000 MG/10ML IJ SOLN
500.0000 mg | Freq: Four times a day (QID) | INTRAVENOUS | Status: DC | PRN
  Administered 2015-11-07: 500 mg via INTRAVENOUS
  Filled 2015-11-07 (×2): qty 5

## 2015-11-07 MED ORDER — ACETAMINOPHEN 500 MG PO TABS
1000.0000 mg | ORAL_TABLET | Freq: Four times a day (QID) | ORAL | Status: AC
  Administered 2015-11-07 – 2015-11-08 (×4): 1000 mg via ORAL
  Filled 2015-11-07 (×4): qty 2

## 2015-11-07 MED ORDER — DEXAMETHASONE SODIUM PHOSPHATE 10 MG/ML IJ SOLN
INTRAMUSCULAR | Status: DC | PRN
  Administered 2015-11-07: 10 mg via INTRAVENOUS

## 2015-11-07 MED ORDER — FLEET ENEMA 7-19 GM/118ML RE ENEM: 1.0000 | ENEMA | Freq: Once | RECTAL | Status: DC | PRN

## 2015-11-07 MED ORDER — FENTANYL CITRATE (PF) 100 MCG/2ML IJ SOLN
INTRAMUSCULAR | Status: AC
  Filled 2015-11-07: qty 2

## 2015-11-07 MED ORDER — SODIUM CHLORIDE 0.9 % IJ SOLN
INTRAMUSCULAR | Status: AC
  Filled 2015-11-07: qty 10

## 2015-11-07 MED ORDER — BUPIVACAINE HCL 0.25 % IJ SOLN
INTRAMUSCULAR | Status: DC | PRN
  Administered 2015-11-07: 20 mL

## 2015-11-07 MED ORDER — ONDANSETRON HCL 4 MG/2ML IJ SOLN
INTRAMUSCULAR | Status: AC
  Filled 2015-11-07: qty 2

## 2015-11-07 MED ORDER — BISACODYL 10 MG RE SUPP: 10.0000 mg | Freq: Every day | RECTAL | Status: DC | PRN

## 2015-11-07 MED ORDER — SODIUM CHLORIDE 0.9 % IJ SOLN
INTRAMUSCULAR | Status: AC
  Filled 2015-11-07: qty 50

## 2015-11-07 MED ORDER — KETOROLAC TROMETHAMINE 15 MG/ML IJ SOLN: 7.5000 mg | Freq: Four times a day (QID) | INTRAMUSCULAR | Status: AC | PRN

## 2015-11-07 MED ORDER — DEXAMETHASONE SODIUM PHOSPHATE 10 MG/ML IJ SOLN: 10.0000 mg | Freq: Once | INTRAMUSCULAR | Status: DC

## 2015-11-07 MED ORDER — METHOCARBAMOL 500 MG PO TABS
500.0000 mg | ORAL_TABLET | Freq: Four times a day (QID) | ORAL | Status: DC | PRN
  Administered 2015-11-07 – 2015-11-09 (×4): 500 mg via ORAL
  Filled 2015-11-07 (×5): qty 1

## 2015-11-07 MED ORDER — METOCLOPRAMIDE HCL 5 MG/ML IJ SOLN
5.0000 mg | Freq: Three times a day (TID) | INTRAMUSCULAR | Status: DC | PRN
  Administered 2015-11-07: 10 mg via INTRAVENOUS
  Filled 2015-11-07: qty 2

## 2015-11-07 MED ORDER — FENTANYL CITRATE (PF) 250 MCG/5ML IJ SOLN
INTRAMUSCULAR | Status: AC
  Filled 2015-11-07: qty 5

## 2015-11-07 MED ORDER — 0.9 % SODIUM CHLORIDE (POUR BTL) OPTIME
TOPICAL | Status: DC | PRN
  Administered 2015-11-07: 1000 mL

## 2015-11-07 SURGICAL SUPPLY — 52 items
BIT DRILL 2.8X128 (BIT) ×2 IMPLANT
BIT DRILL 2.8X128MM (BIT) ×1
BLADE EXTENDED COATED 6.5IN (ELECTRODE) ×3 IMPLANT
BLADE SAW SAG 73X25 THK (BLADE) ×2
BLADE SAW SGTL 73X25 THK (BLADE) ×1 IMPLANT
CAPT HIP TOTAL 2 ×2 IMPLANT
CLOSURE WOUND 1/2 X4 (GAUZE/BANDAGES/DRESSINGS) ×1
DRAPE INCISE IOBAN 66X45 STRL (DRAPES) ×7 IMPLANT
DRAPE ORTHO SPLIT 77X108 STRL (DRAPES) ×6
DRAPE POUCH INSTRU U-SHP 10X18 (DRAPES) ×3 IMPLANT
DRAPE SURG ORHT 6 SPLT 77X108 (DRAPES) ×2 IMPLANT
DRAPE U-SHAPE 47X51 STRL (DRAPES) ×3 IMPLANT
DRSG ADAPTIC 3X8 NADH LF (GAUZE/BANDAGES/DRESSINGS) ×3 IMPLANT
DRSG MEPILEX BORDER 4X12 (GAUZE/BANDAGES/DRESSINGS) ×2 IMPLANT
DRSG MEPILEX BORDER 4X4 (GAUZE/BANDAGES/DRESSINGS) ×3 IMPLANT
DURAPREP 26ML APPLICATOR (WOUND CARE) ×5 IMPLANT
ELECT REM PT RETURN 9FT ADLT (ELECTROSURGICAL) ×3
ELECTRODE REM PT RTRN 9FT ADLT (ELECTROSURGICAL) ×1 IMPLANT
EVACUATOR 1/8 PVC DRAIN (DRAIN) ×3 IMPLANT
FACESHIELD WRAPAROUND (MASK) ×12 IMPLANT
FACESHIELD WRAPAROUND OR TEAM (MASK) ×4 IMPLANT
GAUZE SPONGE 4X4 12PLY STRL (GAUZE/BANDAGES/DRESSINGS) ×3 IMPLANT
GLOVE BIOGEL PI IND STRL 7.0 (GLOVE) IMPLANT
GLOVE BIOGEL PI IND STRL 7.5 (GLOVE) IMPLANT
GLOVE BIOGEL PI IND STRL 8 (GLOVE) ×1 IMPLANT
GLOVE BIOGEL PI INDICATOR 7.0 (GLOVE) ×2
GLOVE BIOGEL PI INDICATOR 7.5 (GLOVE) ×2
GLOVE BIOGEL PI INDICATOR 8 (GLOVE) ×4
GLOVE SURG SS PI 7.0 STRL IVOR (GLOVE) ×2 IMPLANT
GLOVE SURG SS PI 7.5 STRL IVOR (GLOVE) ×4 IMPLANT
GLOVE SURG SS PI 8.0 STRL IVOR (GLOVE) ×2 IMPLANT
GOWN STRL REUS W/TWL LRG LVL3 (GOWN DISPOSABLE) ×5 IMPLANT
GOWN STRL REUS W/TWL XL LVL3 (GOWN DISPOSABLE) ×4 IMPLANT
IMMOBILIZER KNEE 20 (SOFTGOODS) ×5 IMPLANT
IMMOBILIZER KNEE 20 THIGH 36 (SOFTGOODS) ×1 IMPLANT
MANIFOLD NEPTUNE II (INSTRUMENTS) ×3 IMPLANT
MARKER SKIN DUAL TIP RULER LAB (MISCELLANEOUS) ×3 IMPLANT
PADDING CAST COTTON 6X4 STRL (CAST SUPPLIES) ×3 IMPLANT
PASSER SUT SWANSON 36MM LOOP (INSTRUMENTS) ×3 IMPLANT
POSITIONER SURGICAL ARM (MISCELLANEOUS) ×3 IMPLANT
STRIP CLOSURE SKIN 1/2X4 (GAUZE/BANDAGES/DRESSINGS) ×3 IMPLANT
SUT ETHIBOND NAB CT1 #1 30IN (SUTURE) ×6 IMPLANT
SUT MNCRL AB 4-0 PS2 18 (SUTURE) ×3 IMPLANT
SUT VIC AB 2-0 CT1 27 (SUTURE) ×9
SUT VIC AB 2-0 CT1 TAPERPNT 27 (SUTURE) ×3 IMPLANT
SUT VLOC 180 0 24IN GS25 (SUTURE) ×5 IMPLANT
SYR 20CC LL (SYRINGE) ×3 IMPLANT
SYR 50ML LL SCALE MARK (SYRINGE) ×6 IMPLANT
TOWEL OR 17X26 10 PK STRL BLUE (TOWEL DISPOSABLE) ×6 IMPLANT
TOWEL OR NON WOVEN STRL DISP B (DISPOSABLE) ×3 IMPLANT
TRAY FOLEY BAG SILVER LF 16FR (SET/KITS/TRAYS/PACK) ×2 IMPLANT
YANKAUER SUCT BULB TIP 10FT TU (MISCELLANEOUS) ×3 IMPLANT

## 2015-11-07 NOTE — Interval H&P Note (Signed)
History and Physical Interval Note:  11/07/2015 1:13 PM  Marcus Hutchinson  has presented today for surgery, with the diagnosis of LEFT HIP OA  The various methods of treatment have been discussed with the patient and family. After consideration of risks, benefits and other options for treatment, the patient has consented to  Procedure(s): LEFT TOTAL HIP ARTHROPLASTY POSTERIOR  APPROACH (Left) as a surgical intervention .  The patient's history has been reviewed, patient examined, no change in status, stable for surgery.  I have reviewed the patient's chart and labs.  Questions were answered to the patient's satisfaction.     Gearlean Alf

## 2015-11-07 NOTE — H&P (View-Only) (Signed)
Marcus Hutchinson DOB: [birth year] Married / Language: English / Race: White Male Date of Admission:  11/07/2015 CC:  Left Hip Pain History of Present Illness The patient is a 55 year old male who comes in for a preoperative History and Physical. The patient is scheduled for a left total hip arthroplasty (posterior) to be performed by Dr. Dione Plover. Aluisio, MD at [facility] on 11-07-2015. The patient is a 55 year old male who presented for follow up of their hip. The patient is being followed for their left hip pain. Symptoms reported include: pain, stiffness, pain with weightbearing and difficulty ambulating. The patient feels that they are doing poorly and report their pain level to be moderate to severe. The following medication has been used for pain control: Hydrocodone (PRN). The patient presents today following MRI (left hip MRI without contrast. Date of study: 06/12/15). He did have the IA hip injection at [facility] imaging but it did nto help any. He states that his pain has increased since the injection. He has been unable to work and the hip locks up on him. He states that he has been unable to work for a couple of weeks. He wants surgery. Unfortunately, his pain has not gotten any better. He has a stress reaction in the femoral head and neck area. He also had degenerative change with cystic changes in his acetabulum. He states that his hip hurts as bad now as it did several weeks ago. He is at a stage where he feels like he needs to get something done with it. He is ready to proceed with surgery. They have been treated conservatively in the past for the above stated problem and despite conservative measures, they continue to have progressive pain and severe functional limitations and dysfunction. They have failed non-operative management including home exercise, medications, and injections. It is felt that they would benefit from undergoing total joint replacement. Risks and benefits of the  procedure have been discussed with the patient and they elect to proceed with surgery. There are no active contraindications to surgery such as ongoing infection or rapidly progressive neurological disease.   Problem List/Past Medical  Status post right hip replacement [id number])  Primary osteoarthritis of left hip (M16.12)  Stress fracture of left femur with routine healing (M84.352D)  High blood pressure  Migraine Headache  Sleep Apnea  uses CPAP  Allergies No Known Drug Allergies Adhesive Bandages  He did tolerate the Steri-strips with his last hip surgey.  Family History Congestive Heart Failure  Maternal Grandfather. Diabetes Mellitus  Mother. Heart Disease  Father, Maternal Grandfather. Hypertension  Father, Mother.  Social History Children  4 Current work status  working full time Exercise  Exercises weekly; does running / walking Living situation  live with spouse Marital status  married Never consumed alcohol  04/30/2015: Never consumed alcohol No history of drug/alcohol rehab  Not under pain contract  Number of flights of stairs before winded  4-5 Tobacco / smoke exposure  04/30/2015: yes outdoors only Tobacco use  Never smoker. 04/30/2015  Medication History  Meloxicam (15MG  Tablet, 1 (one) Oral po qd with food, Taken starting 09/24/2015) Active. Metoprolol Succinate ER (25MG  Tablet ER 24HR, Oral) Active. Aspirin (81MG  Tablet, Oral) Active.  Past Surgical History Gallbladder Surgery  laporoscopic Total Hip Replacement  right Right Rotator Cuff Repair    Review of Systems  General Not Present- Chills, Fatigue, Fever, Memory Loss, Night Sweats, Weight Gain and Weight Loss. Skin Not Present- Eczema, Hives, Itching,  Lesions and Rash. HEENT Not Present- Dentures, Double Vision, Headache, Hearing Loss, Tinnitus and Visual Loss. Respiratory Not Present- Allergies, Chronic Cough, Coughing up blood, Shortness of breath at rest and  Shortness of breath with exertion. Cardiovascular Not Present- Chest Pain, Difficulty Breathing Lying Down, Murmur, Palpitations, Racing/skipping heartbeats and Swelling. Gastrointestinal Not Present- Abdominal Pain, Bloody Stool, Constipation, Diarrhea, Difficulty Swallowing, Heartburn, Jaundice, Loss of appetitie, Nausea and Vomiting. Male Genitourinary Not Present- Blood in Urine, Discharge, Flank Pain, Incontinence, Painful Urination, Urgency, Urinary frequency, Urinary Retention, Urinating at Night and Weak urinary stream. Musculoskeletal Present- Joint Pain (left hip). Not Present- Back Pain, Joint Swelling, Morning Stiffness, Muscle Pain, Muscle Weakness and Spasms. Neurological Not Present- Blackout spells, Difficulty with balance, Dizziness, Paralysis, Tremor and Weakness. Psychiatric Not Present- Insomnia.  Vitals Weight: 370 lb Height: 73in Weight was reported by patient. Height was reported by patient. Body Surface Area: 2.79 m Body Mass Index: 48.82 kg/m  Pulse: 68 (Regular)  BP: 126/74 (Sitting, Right Arm, Standard)  Physical Exam General Mental Status -Alert, cooperative and good historian. General Appearance-pleasant, Not in acute distress. Orientation-Oriented X3. Build & Nutrition-Well nourished and Well developed.  Head and Neck Head-normocephalic, atraumatic . Neck Global Assessment - supple, no bruit auscultated on the right, no bruit auscultated on the left.  Eye Vision-Wears corrective lenses. Pupil - Bilateral-Regular and Round. Motion - Bilateral-EOMI.  Chest and Lung Exam Auscultation Breath sounds - clear at anterior chest wall and clear at posterior chest wall. Adventitious sounds - No Adventitious sounds.  Cardiovascular Auscultation Rhythm - Regular rate and rhythm. Heart Sounds - S1 WNL and S2 WNL. Murmurs & Other Heart Sounds - Auscultation of the heart reveals - No Murmurs.  Abdomen Inspection Contour - Generalized  moderate distention. Palpation/Percussion Tenderness - Abdomen is non-tender to palpation. Rigidity (guarding) - Abdomen is soft. Auscultation Auscultation of the abdomen reveals - Bowel sounds normal.  Male Genitourinary Note: Not done, not pertinent to present illness   Musculoskeletal Note: He is alert and oriented, no apparent distress. His left hip shows flexion to about 100, rotation in 10, out 30, abduction 30 with discomfort on range of motion.  Assessment & Plan Stress fracture of left femur with routine healing (M84.352D) Primary osteoarthritis of left hip (M16.12)  Left Total Hip Replacement - Posterior Approach  Disposition: Home  PCP: Dr. Woody Seller  IV TXA  Anesthesia Issues: None  Signed electronically by Ok Edwards, III PA-C

## 2015-11-07 NOTE — Anesthesia Preprocedure Evaluation (Addendum)
Anesthesia Evaluation  Patient identified by MRN, date of birth, ID band Patient awake    Reviewed: Allergy & Precautions, NPO status , Patient's Chart, lab work & pertinent test results  Airway Mallampati: II  TM Distance: >3 FB Neck ROM: Full    Dental no notable dental hx.    Pulmonary sleep apnea , former smoker,    Pulmonary exam normal breath sounds clear to auscultation       Cardiovascular hypertension, Pt. on medications and Pt. on home beta blockers Normal cardiovascular exam Rhythm:Regular Rate:Normal     Neuro/Psych negative neurological ROS  negative psych ROS   GI/Hepatic negative GI ROS, Neg liver ROS,   Endo/Other  Morbid obesity  Renal/GU negative Renal ROS  negative genitourinary   Musculoskeletal  (+) Arthritis ,   Abdominal (+) + obese,   Peds negative pediatric ROS (+)  Hematology negative hematology ROS (+)   Anesthesia Other Findings   Reproductive/Obstetrics negative OB ROS                             Anesthesia Physical Anesthesia Plan  ASA: III  Anesthesia Plan: General   Post-op Pain Management:    Induction: Intravenous  Airway Management Planned: Oral ETT  Additional Equipment:   Intra-op Plan:   Post-operative Plan: Extubation in OR  Informed Consent: I have reviewed the patients History and Physical, chart, labs and discussed the procedure including the risks, benefits and alternatives for the proposed anesthesia with the patient or authorized representative who has indicated his/her understanding and acceptance.   Dental advisory given  Plan Discussed with: CRNA  Anesthesia Plan Comments: (Discussed r/b/a of general and spinal. He reports he had a general for his other THA in 2011 and prefers general today. Questions answered.)       Anesthesia Quick Evaluation

## 2015-11-07 NOTE — Op Note (Signed)
Pre-operative diagnosis- Osteoarthritis Left hip  Post-operative diagnosis- Osteoarthritis  Left hip  Procedure-  LeftTotal Hip Arthroplasty  Surgeon- Marcus Plover. Adamaris King, MD  Assistant- [name], PA-C   Anesthesia  General  EBL-700 ml  Drain Hemovac   Complication- None  Condition-PACU - hemodynamically stable.   Brief Clinical Note- Marcus Hutchinson is a 55 y.o. male with end stage arthritis of his left hip with progressively worsening pain and dysfunction. Pain occurs with activity and rest including pain at night. He has tried analgesics, protected weight bearing and rest without benefit. Pain is too severe to attempt physical therapy. Radiographs demonstrate bone on bone arthritis. He presents now for left THA.  Procedure in detail-   The patient is brought into the operating room and placed on the operating table. After successful administration of General  anesthesia, the patient is placed in the  Left lateral decubitus position with the  Left side up and held in place with the hip positioner. The lower extremity is isolated from the perineum with plastic drapes and time-out is performed by the surgical team. The lower extremity is then prepped and draped in the usual sterile fashion. A short posterolateral incision is made with a ten blade through the subcutaneous tissue to the level of the fascia lata which is incised in line with the skin incision. The sciatic nerve is palpated and protected and the short external rotators and capsule are isolated from the femur. The hip is then dislocated and the center of the femoral head is marked. A trial prosthesis is placed such that the trial head corresponds to the center of the patients' native femoral head. The resection level is marked on the femoral neck and the resection is made with an oscillating saw. The femoral head is removed and femoral retractors placed to gain access to the femoral canal.      The canal finder is passed into  the femoral canal and the canal is thoroughly irrigated with sterile saline to remove the fatty contents. Axial reaming is performed to 13.5  mm, proximal reaming to 104F  and the sleeve machined to a large. A 18 F large trial sleeve is placed into the proximal femur.      The femur is then retracted anteriorly to gain acetabular exposure. Acetabular retractors are placed and the labrum and osteophytes are removed, Acetabular reaming is performed to 55  mm and a 56  mm Pinnacle acetabular shell is placed in anatomic position with excellent purchase. Additional dome screws were not needed. The permanent 36 mm neutral + 4 Marathon liner is placed into the acetabular shell.      The trial femur is then placed into the femoral canal. The size is 18 x 13  stem with a 36 + 8  neck and a 36 + 0 head with the neck version 10 degrees beyond  the patients' native anteversion. The hip is reduced with excellent stability with full extension and full external rotation, 70 degrees flexion with 40 degrees adduction and 90 degrees internal rotation and 90 degrees of flexion with 70 degrees of internal rotation. The operative leg is placed on top of the non-operative leg and the leg lengths are found to be equal. The trials are then removed and the permanent implant of the same size is impacted into the femoral canal. The ceramic femoral head of the same size as the trial is placed and the hip is reduced with the same stability parameters. The operative leg  is again placed on top of the non-operative leg and the leg lengths are found to be equal.      The wound is then copiously irrigated with saline solution and the capsule and short external rotators are re-attached to the femur through drill holes with Ethibond suture. The fascia lata is closed over a hemovac drain with #1 v-loc and the fascia lata, gluteal muscles and subcutaneous tissues are injected with  20 ml of .25% Marcaine. The subcutaneous tissues are closed with #1  and2-0 vicryl and the subcuticular layer closed with running 4-0 Monocryl. The drain is hooked to suction, incision cleaned and dried, and steri-srips and a bulky sterile dressing applied. The limb is placed into a knee immobilizer and the patient is awakened and transported to recovery in stable condition.      Please note that a surgical assistant was a medical necessity for this procedure in order to perform it in a safe and expeditious manner. The assistant was necessary to provide retraction to the vital neurovascular structures and to retract and position the limb to allow for anatomic placement of the prosthetic components.  Marcus Plover Bridie Colquhoun, MD    11/07/2015, 3:24 PM

## 2015-11-07 NOTE — Transfer of Care (Signed)
Immediate Anesthesia Transfer of Care Note  Patient: Marcus Hutchinson  Procedure(s) Performed: Procedure(s): LEFT TOTAL HIP ARTHROPLASTY POSTERIOR  APPROACH (Left)  Patient Location: PACU  Anesthesia Type:General  Level of Consciousness: awake and oriented  Airway & Oxygen Therapy: Patient Spontanous Breathing and Patient connected to face mask oxygen  Post-op Assessment: Report given to RN and Post -op Vital signs reviewed and stable  Post vital signs: Reviewed and stable  Last Vitals:  Filed Vitals:   11/07/15 1133  BP: 143/98  Pulse: 73  Temp: 36.8 C  Resp: 16    Complications: No apparent anesthesia complications

## 2015-11-07 NOTE — Anesthesia Postprocedure Evaluation (Signed)
Anesthesia Post Note  Patient: Marcus Hutchinson  Procedure(s) Performed: Procedure(s) (LRB): LEFT TOTAL HIP ARTHROPLASTY POSTERIOR  APPROACH (Left)  Patient location during evaluation: PACU Anesthesia Type: Spinal and MAC Level of consciousness: awake and alert Pain management: pain level controlled Vital Signs Assessment: post-procedure vital signs reviewed and stable Respiratory status: spontaneous breathing and respiratory function stable Cardiovascular status: blood pressure returned to baseline and stable Postop Assessment: spinal receding Anesthetic complications: no    Last Vitals:  Filed Vitals:   11/07/15 1700 11/07/15 1726  BP: 140/80 134/72  Pulse: 74 86  Temp: 36.7 C 37.2 C  Resp: 13 14    Last Pain:  Filed Vitals:   11/07/15 1755  PainSc: 9                  [name] DAVID

## 2015-11-07 NOTE — Anesthesia Procedure Notes (Addendum)
Procedure Name: Intubation Date/Time: 11/07/2015 1:27 PM Performed by: Dione Booze Pre-anesthesia Checklist: Emergency Drugs available, Suction available, Patient being monitored and Patient identified Patient Re-evaluated:Patient Re-evaluated prior to inductionOxygen Delivery Method: Circle system utilized Preoxygenation: Pre-oxygenation with 100% oxygen Intubation Type: IV induction Ventilation: Mask ventilation without difficulty Laryngoscope Size: Mac and 4 Grade View: Grade II Tube type: Oral Tube size: 7.5 mm Number of attempts: 1 Airway Equipment and Method: Stylet Placement Confirmation: ETT inserted through vocal cords under direct vision,  breath sounds checked- equal and bilateral and positive ETCO2 Secured at: 22 cm Tube secured with: Tape Dental Injury: Teeth and Oropharynx as per pre-operative assessment  Comments: Round nodules present on inner lip, cheek tissue.

## 2015-11-08 ENCOUNTER — Encounter (HOSPITAL_COMMUNITY): Payer: Self-pay | Admitting: Orthopedic Surgery

## 2015-11-08 LAB — BASIC METABOLIC PANEL
Anion gap: 9 (ref 5–15)
BUN: 15 mg/dL (ref 6–20)
CHLORIDE: 107 mmol/L (ref 101–111)
CO2: 22 mmol/L (ref 22–32)
CREATININE: 1.19 mg/dL (ref 0.61–1.24)
Calcium: 8.6 mg/dL — ABNORMAL LOW (ref 8.9–10.3)
GFR calc non Af Amer: >60 mL/min (ref >60)
GLUCOSE: 183 mg/dL — AB (ref 65–99)
Potassium: 4.5 mmol/L (ref 3.5–5.1)
Sodium: 138 mmol/L (ref 135–145)

## 2015-11-08 LAB — CBC
HEMATOCRIT: 38.4 % — AB (ref 39.0–52.0)
HEMOGLOBIN: 12.9 g/dL — AB (ref 13.0–17.0)
MCH: 28 pg (ref 26.0–34.0)
MCHC: 33.6 g/dL (ref 30.0–36.0)
MCV: 83.3 fL (ref 78.0–100.0)
Platelets: 222 10*3/uL (ref 150–400)
RBC: 4.61 MIL/uL (ref 4.22–5.81)
RDW: 14.2 % (ref 11.5–15.5)
WBC: 11.5 10*3/uL — ABNORMAL HIGH (ref 4.0–10.5)

## 2015-11-08 NOTE — Progress Notes (Signed)
OT Cancellation Note  Patient Details Name: Marcus Hutchinson MRN: [id number] DOB: 11-Feb-1961   Cancelled Treatment:    Reason Eval/Treat Not Completed: OT screened, no needs identified, will sign off.  Pt has had other hip done 6 years ago.  He has AE/DME and wife will assist.  Reviewed precautions with ADLs.  No OT needs at this time.  Courtne Lighty 11/08/2015, 12:17 PM  Lesle Chris, OTR/L [phone] 11/08/2015

## 2015-11-08 NOTE — Progress Notes (Signed)
Physical Therapy Treatment Patient Details Name: YOSHIYUKI LOMANTO MRN: [id number] DOB: 1960-11-07 Today's Date: 11/08/2015    History of Present Illness L THR - posterior approach.  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  Pt and spouse hopeful for dc tomorrow.  Follow Up Recommendations  Home health PT     Equipment Recommendations  None recommended by PT    Recommendations for Other Services       Precautions / Restrictions Precautions Precautions: Posterior Hip Precaution Booklet Issued: Yes (comment) Precaution Comments: Pt recalls 2/3 THP without cues Restrictions Weight Bearing Restrictions: No Other Position/Activity Restrictions: WBAT    Mobility  Bed Mobility Overal bed mobility: Needs Assistance Bed Mobility: Sit to Supine       Sit to supine: Min guard   General bed mobility comments: min cues for sequence and use of R LE to self assist  Transfers Overall transfer level: Needs assistance Equipment used: Rolling walker (2 wheeled) Transfers: Sit to/from Stand Sit to Stand: Min guard         General transfer comment: cues for LE management and use of UEs to self assist  Ambulation/Gait Ambulation/Gait assistance: Min guard Ambulation Distance (Feet): 146 Feet Assistive device: Rolling walker (2 wheeled) Gait Pattern/deviations: Step-to pattern;Step-through pattern;Decreased step length - right;Decreased step length - left;Shuffle;Trunk flexed     General Gait Details: min cues for posture, position from RW and sequence.  One episode mild balance loss noted with min guard to steady   Financial trader Rankin (Stroke Patients Only)       Balance                                    Cognition Arousal/Alertness: Awake/alert Behavior During Therapy: WFL for tasks assessed/performed Overall Cognitive Status: Within Functional Limits for tasks  assessed                      Exercises      General Comments        Pertinent Vitals/Pain Pain Assessment: 0-10 Pain Score: 5  Pain Location: L hip Pain Descriptors / Indicators: Aching;Sore Pain Intervention(s): Limited activity within patient's tolerance;Monitored during session;Premedicated before session;Ice applied    Home Living                      Prior Function            PT Goals (current goals can now be found in the care plan section) Acute Rehab PT Goals 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

## 2015-11-08 NOTE — Progress Notes (Signed)
Physical Therapy Evaluation Patient Details Name: Marcus Hutchinson MRN: [id number] DOB: September 09, 1960 Today's Date: 11/08/2015   History of Present Illness  L THR - posterior approach.  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  Pt should progress to dc home with family assist and HHPT follow up.    Follow Up Recommendations Home health PT    Equipment Recommendations  None recommended by PT    Recommendations for Other Services       Precautions / Restrictions Precautions Precautions: Posterior Hip Precaution Booklet Issued: Yes (comment) Precaution Comments: Reviewed all THP Restrictions Weight Bearing Restrictions: No Other Position/Activity Restrictions: WBAT      Mobility  Bed Mobility Overal bed mobility: Needs Assistance Bed Mobility: Supine to Sit     Supine to sit: Min assist     General bed mobility comments: cues for sequence and use of R LE to self assist  Transfers Overall transfer level: Needs assistance Equipment used: Rolling walker (2 wheeled) Transfers: Sit to/from Stand Sit to Stand: Min guard         General transfer comment: cues for LE management and use of UEs to self assist  Ambulation/Gait Ambulation/Gait assistance: Min assist;Min guard Ambulation Distance (Feet): 100 Feet Assistive device: Rolling walker (2 wheeled) Gait Pattern/deviations: Step-to pattern;Decreased step length - right;Decreased step length - left;Shuffle;Trunk flexed Gait velocity: decr Gait velocity interpretation: Below normal speed for age/gender General Gait Details: min cues for posture, position from RW and sequence  Stairs            Wheelchair Mobility    Modified Rankin (Stroke Patients Only)       Balance                                             Pertinent Vitals/Pain Pain Assessment:  0-10 Pain Score: 5  Pain Location: L hip Pain Descriptors / Indicators: Aching;Sore Pain Intervention(s): Limited activity within patient's tolerance;Monitored during session;Premedicated before session;Ice applied    Home Living Family/patient expects to be discharged to:: Private residence Living Arrangements: Spouse/significant other Available Help at Discharge: Family Type of Home: House Home Access: [place]: One [place]: Bedside commode;Toilet riser;Walker - 2 wheels      Prior Function Level of Independence: Independent               Hand Dominance   Dominant Hand: Right    Extremity/Trunk Assessment   Upper Extremity Assessment: Overall WFL for tasks assessed           Lower Extremity Assessment: LLE deficits/detail   LLE Deficits / Details: 3-/5 strength at hip with AAROM to 85 flex and 20 abd  Cervical / Trunk Assessment: Normal  Communication   Communication: No difficulties  Cognition Arousal/Alertness: Awake/alert Behavior During Therapy: WFL for tasks assessed/performed Overall Cognitive Status: Within Functional Limits for tasks assessed                      General Comments      Exercises Total Joint Exercises Ankle Circles/Pumps: AROM;Both;15 reps;Supine Quad Sets: AROM;Both;10 reps;Supine Heel Slides: AAROM;Left;Supine;20 reps Hip ABduction/ADduction: AAROM;Left;15 reps;Supine      Assessment/Plan    PT Assessment Patient  needs continued PT services  PT Diagnosis Difficulty walking   PT Problem List Decreased strength;Decreased range of motion;Decreased activity tolerance;Decreased mobility;Decreased knowledge of use of DME;Obesity;Pain;Decreased knowledge of precautions  PT Treatment Interventions DME instruction;Gait training;Stair training;Functional mobility training;Therapeutic activities;Therapeutic exercise;Patient/family education   PT Goals (Current goals can be found in the  Care Plan section) Acute Rehab PT Goals 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 PT Goal Formulation: With patient Time For Goal Achievement: 11/13/15 Potential to Achieve Goals: Good    Frequency 7X/week   Barriers to discharge        Co-evaluation               End of Session   Activity Tolerance: Patient tolerated treatment well Patient left: in chair;with call bell/phone within reach;with chair alarm set;with family/visitor present Nurse Communication: Mobility status         Time: 1030-1056 PT Time Calculation (min) (ACUTE ONLY): 26 min   Charges:   PT Evaluation $PT Eval Low Complexity: 1 Procedure PT Treatments $Therapeutic Exercise: 8-22 mins   PT G Codes:        Pinchas Reither Nov 22, 2015, 12:53 PM

## 2015-11-08 NOTE — Care Management Note (Signed)
Case Management Note  Patient Details  Name: Marcus Hutchinson MRN: [id number] Date of Birth: 02-20-1961  Subjective/Objective:                   L THR - posterior approach       Action/Plan: Discharge planning Expected Discharge Date:  11/09/15               Expected Discharge Plan:  [place]  In-House Referral:     Discharge planning Services  CM Consult  Post Acute Care Choice:    Choice offered to:  Patient  DME Arranged:  N/A DME Agency:  NA  HH Arranged:  PT HH Agency:     Status of Service:  In process, will continue to follow  Medicare Important Message Given:    Date Medicare IM Given:    Medicare IM give by:    Date Additional Medicare IM Given:    Additional Medicare Important Message give by:     If discussed at [place] of Stay Meetings, dates discussed:    Additional Comments: CM met with pt in room to offer choice of home health agency.  Pt states if he needs HHPT, he is willing but hopes to have the MD ok NO HHPT.  CM notes POD PT eval is for HHPT.  Pt states IF he goes home with HHPT, [facility] will be fine. Tentative referral for HHPT given to [place] rep, Tim who states he will follow for final decision.  Pt has both rolling walker and 3n1 at home and states he does not need any other DME.  Pt going home with wife at discharge.  CM will follow follow for final determination and update [facility]. Dellie Catholic, RN 11/08/2015, 1:11 PM

## 2015-11-08 NOTE — Progress Notes (Signed)
   Subjective: 1 Day Post-Op Procedure(s) (LRB): LEFT TOTAL HIP ARTHROPLASTY POSTERIOR  APPROACH (Left) Patient reports pain as mild.   Patient seen in rounds by Dr. Wynelle Link.  Doing well today. Patient is well, and has had no acute complaints or problems We will start therapy today.  Plan is to go Home after hospital stay.  Objective: Vital signs in last 24 hours: Temp:  [97.2 F (36.2 C)-99 F (37.2 C)] 97.8 F (36.6 C) (03/30 1349) Pulse Rate:  [67-86] 72 (03/30 1349) Resp:  [13-18] 16 (03/30 1349) BP: (111-145)/(38-91) 112/66 mmHg (03/30 1349) SpO2:  [93 %-100 %] 93 % (03/30 1349)  Intake/Output from previous day:  Intake/Output Summary (Last 24 hours) at 11/08/15 1554 Last data filed at 11/08/15 1546  Gross per 24 hour  Intake   2840 ml  Output   1996 ml  Net    844 ml    Intake/Output this shift: Total I/O In: 720 [P.O.:720] Out: 500 [Urine:500]  Labs:  Recent Labs  11/08/15 0349  HGB 12.9*    Recent Labs  11/08/15 0349  WBC 11.5*  RBC 4.61  HCT 38.4*  PLT 222    Recent Labs  11/08/15 0349  NA 138  K 4.5  CL 107  CO2 22  BUN 15  CREATININE 1.19  GLUCOSE 183*  CALCIUM 8.6*   No results for input(s): LABPT, INR in the last 72 hours.  EXAM General - Patient is Alert, Appropriate and Oriented Extremity - Neurovascular intact Sensation intact distally Dorsiflexion/Plantar flexion intact Dressing - dressing C/D/I Motor Function - intact, moving foot and toes well on exam.  Hemovac pulled without difficulty.  Past Medical History  Diagnosis Date  . Sleep apnea     cpap use pressure 17  . Hypertension   . History of kidney stones     x 3 episodes- none in awhile.  . Arthritis     osteoarthritis, spinal stenosis    Assessment/Plan: 1 Day Post-Op Procedure(s) (LRB): LEFT TOTAL HIP ARTHROPLASTY POSTERIOR  APPROACH (Left) Principal Problem:   OA (osteoarthritis) of hip  Estimated body mass index is 53.76 kg/(m^2) as calculated from  the following:   Height as of this encounter: 6\' 1"  (1.854 m).   Weight as of this encounter: 184.784 kg (407 lb 6 oz). Advance diet Up with therapy Plan for discharge tomorrow  DVT Prophylaxis - Xarelto Weight Bearing As Tolerated left Leg Hemovac Pulled Begin Therapy  [name], PA-C Orthopaedic Surgery 11/08/2015, 3:54 PM  .

## 2015-11-08 NOTE — Discharge Instructions (Addendum)
Dr. Gaynelle Arabian Total Joint Specialist [facility] [address] [place], [place] [address] [phone]   POSTERIOR TOTAL HIP REPLACEMENT POSTOPERATIVE DIRECTIONS  Hip Rehabilitation, Guidelines Following Surgery  The results of a hip operation are greatly improved after range of motion and muscle strengthening exercises. Follow all safety measures which are given to protect your hip. If any of these exercises cause increased pain or swelling in your joint, decrease the amount until you are comfortable again. Then slowly increase the exercises. Call your caregiver if you have problems or questions.   HOME CARE INSTRUCTIONS  Remove items at home which could result in a fall. This includes throw rugs or furniture in walking pathways.   ICE to the affected hip every three hours for 30 minutes at a time and then as needed for pain and swelling.  Continue to use ice on the hip for pain and swelling from surgery. You may notice swelling that will progress down to the foot and ankle.  This is normal after surgery.  Elevate the leg when you are not up walking on it.    Continue to use the breathing machine which will help keep your temperature down.  It is common for your temperature to cycle up and down following surgery, especially at night when you are not up moving around and exerting yourself.  The breathing machine keeps your lungs expanded and your temperature down.  DIET You may resume your previous home diet once your are discharged from the hospital.  DRESSING / WOUND CARE / SHOWERING You may shower 3 days after surgery, but keep the wounds dry during showering.  You may use an occlusive plastic wrap (Press'n Seal for example), NO SOAKING/SUBMERGING IN THE BATHTUB.  If the bandage gets wet, change with a clean dry gauze.  If the incision gets wet, pat the wound dry with a clean towel. You may start showering once you are discharged home but do not submerge  the incision under water. Just pat the incision dry and apply a dry gauze dressing on daily. Change the surgical dressing daily and reapply a dry dressing each time.    ACTIVITY Walk with your walker as instructed. Use walker as long as suggested by your caregivers. Avoid periods of inactivity such as sitting longer than an hour when not asleep. This helps prevent blood clots.  You may resume a sexual relationship in one month or when given the OK by your doctor.  You may return to work once you are cleared by your doctor.  Do not drive a car for 6 weeks or until released by you surgeon.  Do not drive while taking narcotics.  WEIGHT BEARING Weight bearing as tolerated with assist device (walker, cane, etc) as directed, use it as long as suggested by your surgeon or therapist, typically at least 4-6 weeks.  POSTOPERATIVE CONSTIPATION PROTOCOL Constipation - defined medically as fewer than three stools per week and severe constipation as less than one stool per week.  One of the most common issues patients have following surgery is constipation.  Even if you have a regular bowel pattern at home, your normal regimen is likely to be disrupted due to multiple reasons following surgery.  Combination of anesthesia, postoperative narcotics, change in appetite and fluid intake all can affect your bowels.  In order to avoid complications following surgery, here are some recommendations in order to help you during your recovery period.  Colace (docusate) - Pick up an over-the-counter  form of Colace or another stool softener and take twice a day as long as you are requiring postoperative pain medications.  Take with a full glass of water daily.  If you experience loose stools or diarrhea, hold the colace until you stool forms back up.  If your symptoms do not get better within 1 week or if they get worse, check with your doctor.  Dulcolax (bisacodyl) - Pick up over-the-counter and take as directed by the  product packaging as needed to assist with the movement of your bowels.  Take with a full glass of water.  Use this product as needed if not relieved by Colace only.   MiraLax (polyethylene glycol) - Pick up over-the-counter to have on hand.  MiraLax is a solution that will increase the amount of water in your bowels to assist with bowel movements.  Take as directed and can mix with a glass of water, juice, soda, coffee, or tea.  Take if you go more than two days without a movement. Do not use MiraLax more than once per day. Call your doctor if you are still constipated or irregular after using this medication for 7 days in a row.  If you continue to have problems with postoperative constipation, please contact the office for further assistance and recommendations.  If you experience "the worst abdominal pain ever" or develop nausea or vomiting, please contact the office immediatly for further recommendations for treatment.  ITCHING  If you experience itching with your medications, try taking only a single pain pill, or even half a pain pill at a time.  You can also use Benadryl over the counter for itching or also to help with sleep.   TED HOSE STOCKINGS Wear the elastic stockings on both legs for three weeks following surgery during the day but you may remove then at night for sleeping.  MEDICATIONS See your medication summary on the After Visit Summary that the nursing staff will review with you prior to discharge.  You may have some home medications which will be placed on hold until you complete the course of blood thinner medication.  It is important for you to complete the blood thinner medication as prescribed by your surgeon.  Continue your approved medications as instructed at time of discharge.  PRECAUTIONS If you experience chest pain or shortness of breath - call 911 immediately for transfer to the hospital emergency department.  If you develop a fever greater that 101 F, purulent  drainage from wound, increased redness or drainage from wound, foul odor from the wound/dressing, or calf pain - CONTACT YOUR SURGEON.                                                   FOLLOW-UP APPOINTMENTS Make sure you keep all of your appointments after your operation with your surgeon and caregivers. You should call the office at the above phone number and make an appointment for approximately two weeks after the date of your surgery or on the date instructed by your surgeon outlined in the "After Visit Summary".  RANGE OF MOTION AND STRENGTHENING EXERCISES  These exercises are designed to help you keep full movement of your hip joint. Follow your caregiver's or physical therapist's instructions. Perform all exercises about fifteen times, three times per day or as directed. Exercise both hips, even if you  have had only one joint replacement. These exercises can be done on a training (exercise) mat, on the floor, on a table or on a bed. Use whatever works the best and is most comfortable for you. Use music or television while you are exercising so that the exercises are a pleasant break in your day. This will make your life better with the exercises acting as a break in routine you can look forward to.  Lying on your back, slowly slide your foot toward your buttocks, raising your knee up off the floor. Then slowly slide your foot back down until your leg is straight again.  Lying on your back spread your legs as far apart as you can without causing discomfort.  Lying on your side, raise your upper leg and foot straight up from the floor as far as is comfortable. Slowly lower the leg and repeat.  Lying on your back, tighten up the muscle in the front of your thigh (quadriceps muscles). You can do this by keeping your leg straight and trying to raise your heel off the floor. This helps strengthen the largest muscle supporting your knee.  Lying on your back, tighten up the muscles of your buttocks both  with the legs straight and with the knee bent at a comfortable angle while keeping your heel on the floor.      IF YOU ARE TRANSFERRED TO A SKILLED REHAB FACILITY If the patient is transferred to a skilled rehab facility following release from the hospital, a list of the current medications will be sent to the facility for the patient to continue.  When discharged from the skilled rehab facility, please have the facility set up the patient's [place] prior to being released. Also, the skilled facility will be responsible for providing the patient with their medications at time of release from the facility to include their pain medication, the muscle relaxants, and their blood thinner medication. If the patient is still at the rehab facility at time of the two week follow up appointment, the skilled rehab facility will also need to assist the patient in arranging follow up appointment in our office and any transportation needs.  MAKE SURE YOU:  Understand these instructions.  Get help right away if you are not doing well or get worse.    Pick up stool softner and laxative for home use following surgery while on pain medications. Do not submerge incision under water. Please use good hand washing techniques while changing dressing each day. May shower starting three days after surgery. Please use a clean towel to pat the incision dry following showers. Continue to use ice for pain and swelling after surgery. Do not use any lotions or creams on the incision until instructed by your surgeon.  Take Xarelto for two and a half more weeks, then discontinue Xarelto. Once the patient has completed the Xarelto, they may resume the 81 mg Aspirin.   Information on my medicine - XARELTO (Rivaroxaban)  This medication education was reviewed with me or my healthcare representative as part of my discharge preparation.  The pharmacist that spoke with me during my hospital stay was:   Hershal Coria, [facility]  Why was Xarelto prescribed for you? Xarelto was prescribed for you to reduce the risk of blood clots forming after orthopedic surgery. The medical term for these abnormal blood clots is venous thromboembolism (VTE).  What do you need to know about xarelto ? Take your Xarelto ONCE DAILY at the same  time every day. You may take it either with or without food.  If you have difficulty swallowing the tablet whole, you may crush it and mix in applesauce just prior to taking your dose.  Take Xarelto exactly as prescribed by your doctor and DO NOT stop taking Xarelto without talking to the doctor who prescribed the medication.  Stopping without other VTE prevention medication to take the place of Xarelto may increase your risk of developing a clot.  After discharge, you should have regular check-up appointments with your healthcare provider that is prescribing your Xarelto.    What do you do if you miss a dose? If you miss a dose, take it as soon as you remember on the same day then continue your regularly scheduled once daily regimen the next day. Do not take two doses of Xarelto on the same day.   Important Safety Information A possible side effect of Xarelto is bleeding. You should call your healthcare provider right away if you experience any of the following: ? Bleeding from an injury or your nose that does not stop. ? Unusual colored urine (red or dark brown) or unusual colored stools (red or black). ? Unusual bruising for unknown reasons. ? A serious fall or if you hit your head (even if there is no bleeding).  Some medicines may interact with Xarelto and might increase your risk of bleeding while on Xarelto. To help avoid this, consult your healthcare provider or pharmacist prior to using any new prescription or non-prescription medications, including herbals, vitamins, non-steroidal anti-inflammatory drugs (NSAIDs) and supplements.  This website has more  information on Xarelto: [email or web address]

## 2015-11-08 NOTE — Progress Notes (Signed)
Pt. Places self on cpap. RT informed pt. To notify if he needed any assistance.

## 2015-11-09 LAB — BASIC METABOLIC PANEL
Anion gap: 9 (ref 5–15)
BUN: 23 mg/dL — ABNORMAL HIGH (ref 6–20)
CALCIUM: 8.4 mg/dL — AB (ref 8.9–10.3)
CHLORIDE: 106 mmol/L (ref 101–111)
CO2: 24 mmol/L (ref 22–32)
Creatinine, Ser: 1.23 mg/dL (ref 0.61–1.24)
GFR calc non Af Amer: >60 mL/min (ref >60)
Glucose, Bld: 178 mg/dL — ABNORMAL HIGH (ref 65–99)
POTASSIUM: 4.4 mmol/L (ref 3.5–5.1)
Sodium: 139 mmol/L (ref 135–145)

## 2015-11-09 LAB — CBC
HEMATOCRIT: 36.8 % — AB (ref 39.0–52.0)
HEMOGLOBIN: 12.2 g/dL — AB (ref 13.0–17.0)
MCH: 27.7 pg (ref 26.0–34.0)
MCHC: 33.2 g/dL (ref 30.0–36.0)
MCV: 83.4 fL (ref 78.0–100.0)
Platelets: 223 10*3/uL (ref 150–400)
RBC: 4.41 MIL/uL (ref 4.22–5.81)
RDW: 14.3 % (ref 11.5–15.5)
WBC: 14.1 10*3/uL — AB (ref 4.0–10.5)

## 2015-11-09 MED ORDER — METHOCARBAMOL 500 MG PO TABS: 500.0000 mg | ORAL_TABLET | Freq: Four times a day (QID) | ORAL | Status: DC | PRN

## 2015-11-09 MED ORDER — TRAMADOL HCL 50 MG PO TABS: 50.0000 mg | ORAL_TABLET | Freq: Four times a day (QID) | ORAL | Status: DC | PRN

## 2015-11-09 MED ORDER — OXYCODONE HCL 5 MG PO TABS: 5.0000 mg | ORAL_TABLET | ORAL | Status: DC | PRN

## 2015-11-09 MED ORDER — RIVAROXABAN 10 MG PO TABS: 10.0000 mg | ORAL_TABLET | Freq: Every day | ORAL | Status: DC

## 2015-11-09 NOTE — Progress Notes (Signed)
Physical Therapy Treatment Patient Details Name: Marcus Hutchinson MRN: [id number] DOB: 31-Jan-1961 Today's Date: 11/09/2015    History of Present Illness L THR - posterior approach.  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  Follow Up Recommendations  Home health PT     Equipment Recommendations  None recommended by PT    Recommendations for Other Services       Precautions / Restrictions Precautions Precautions: Posterior Hip Precaution Booklet Issued: Yes (comment) Precaution Comments: Pt recalls 2/3 THP without cues.  All THP reviewed Restrictions Weight Bearing Restrictions: No Other Position/Activity Restrictions: WBAT    Mobility  Bed Mobility Overal bed mobility: Modified Independent Bed Mobility: Supine to Sit;Sit to Supine     Supine to sit: Modified independent (Device/Increase time) Sit to supine: Modified independent (Device/Increase time)   General bed mobility comments: Pt in/out of bed unassisted  Transfers Overall transfer level: Needs assistance Equipment used: Rolling walker (2 wheeled) Transfers: Sit to/from Stand Sit to Stand: Supervision         General transfer comment: cues for LE management and use of UEs to self assist  Ambulation/Gait Ambulation/Gait assistance: Supervision Ambulation Distance (Feet): 200 Feet Assistive device: Rolling walker (2 wheeled) Gait Pattern/deviations: Step-to pattern;Step-through pattern;Shuffle;Trunk flexed     General Gait Details: min cues for posture, position from RW and sequence.    Stairs            Wheelchair Mobility    Modified Rankin (Stroke Patients Only)       Balance                                    Cognition Arousal/Alertness: Awake/alert Behavior During Therapy: WFL for tasks assessed/performed Overall Cognitive Status: Within Functional Limits for tasks assessed                       Exercises Total Joint Exercises Ankle Circles/Pumps: AROM;Both;15 reps;Supine Quad Sets: AROM;Both;10 reps;Supine Gluteal Sets: AROM;Both;10 reps;Supine Heel Slides: AAROM;Left;Supine;20 reps Hip ABduction/ADduction: AAROM;Left;15 reps;Supine Long Arc Quad: AROM;Left;15 reps;Seated    General Comments        Pertinent Vitals/Pain Pain Assessment: 0-10 Pain Score: 3  Pain Location: L hip Pain Descriptors / Indicators: Sore Pain Intervention(s): Limited activity within patient's tolerance;Monitored during session;Premedicated before session    Home Living                      Prior Function            PT Goals (current goals can now be found in the care plan section) Acute Rehab PT Goals 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 PT Goal Formulation: With patient Time For Goal Achievement: 11/13/15 Potential to Achieve Goals: Good Progress towards PT goals: Progressing toward goals    Frequency  7X/week    PT Plan Current plan remains appropriate    Co-evaluation             End of Session   Activity Tolerance: Patient tolerated treatment well Patient left: in chair;with call bell/phone within reach;with family/visitor present     Time: [id number] PT Time Calculation (min) (ACUTE ONLY): 23 min  Charges:  $Gait Training: 8-22 mins $Therapeutic Exercise: 8-22 mins  G Codes:      Woodie Degraffenreid 2015-11-25, 12:39 PM

## 2015-11-09 NOTE — Progress Notes (Signed)
   Subjective: 2 Days Post-Op Procedure(s) (LRB): LEFT TOTAL HIP ARTHROPLASTY POSTERIOR  APPROACH (Left) Patient reports pain as mild.   Patient seen in rounds with Dr. Wynelle Link.  Preop pain already improved. Patient is well, but has had some minor complaints of pain in the hip, requiring pain medications Patient is ready to go home today. Wants to go straight to outpatient.  Written RX provided for patient.  Objective: Vital signs in last 24 hours: Temp:  [97.5 F (36.4 C)-98.6 F (37 C)] 97.5 F (36.4 C) (03/31 0451) Pulse Rate:  [61-78] 61 (03/31 0451) Resp:  [16-18] 17 (03/31 0451) BP: (111-140)/(38-81) 124/73 mmHg (03/31 0451) SpO2:  [93 %-96 %] 96 % (03/31 0451)  Intake/Output from previous day:  Intake/Output Summary (Last 24 hours) at 11/09/15 0759 Last data filed at 11/08/15 2100  Gross per 24 hour  Intake   1680 ml  Output    950 ml  Net    730 ml     Labs:  Recent Labs  11/08/15 0349 11/09/15 0353  HGB 12.9* 12.2*    Recent Labs  11/08/15 0349 11/09/15 0353  WBC 11.5* 14.1*  RBC 4.61 4.41  HCT 38.4* 36.8*  PLT 222 223    Recent Labs  11/08/15 0349 11/09/15 0353  NA 138 139  K 4.5 4.4  CL 107 106  CO2 22 24  BUN 15 23*  CREATININE 1.19 1.23  GLUCOSE 183* 178*  CALCIUM 8.6* 8.4*   No results for input(s): LABPT, INR in the last 72 hours.  EXAM: General - Patient is Alert, Appropriate and Oriented Extremity - Neurovascular intact Sensation intact distally Dorsiflexion/Plantar flexion intact Incision - clean, dry, no drainage Motor Function - intact, moving foot and toes well on exam.   Assessment/Plan: 2 Days Post-Op Procedure(s) (LRB): LEFT TOTAL HIP ARTHROPLASTY POSTERIOR  APPROACH (Left) Procedure(s) (LRB): LEFT TOTAL HIP ARTHROPLASTY POSTERIOR  APPROACH (Left) Past Medical History  Diagnosis Date  . Sleep apnea     cpap use pressure 17  . Hypertension   . History of kidney stones     x 3 episodes- none in awhile.  .  Arthritis     osteoarthritis, spinal stenosis   Principal Problem:   OA (osteoarthritis) of hip  Estimated body mass index is 53.76 kg/(m^2) as calculated from the following:   Height as of this encounter: 6\' 1"  (1.854 m).   Weight as of this encounter: 184.784 kg (407 lb 6 oz). Up with therapy Discharge home RX for outpatient therapy Diet - Cardiac diet Follow up - in 2 weeks on Tuesday 4/11 Activity - WBAT Disposition - Home Condition Upon Discharge - Good D/C Meds - See DC Summary DVT Prophylaxis - Xarelto  [name], PA-C Orthopaedic Surgery 11/09/2015, 7:59 AM

## 2015-11-09 NOTE — Discharge Summary (Signed)
Physician Discharge Summary   Patient ID: Marcus Hutchinson MRN: [id number] DOB/AGE: May 08, 1961 55 y.o.  Admit date: 11/07/2015 Discharge date: 3-31-20117  Primary Diagnosis:  Osteoarthritis Left hip  Admission Diagnoses:  Past Medical History  Diagnosis Date  . Sleep apnea     cpap use pressure 17  . Hypertension   . History of kidney stones     x 3 episodes- none in awhile.  . Arthritis     osteoarthritis, spinal stenosis   Discharge Diagnoses:   Principal Problem:   OA (osteoarthritis) of hip  Estimated body mass index is 53.76 kg/(m^2) as calculated from the following:   Height as of this encounter: '6\' 1"'$  (1.854 m).   Weight as of this encounter: 184.784 kg (407 lb 6 oz).  Procedure(s) (LRB): LEFT TOTAL HIP ARTHROPLASTY POSTERIOR  APPROACH (Left)   Consults: None  HPI: Marcus Hutchinson is a 55 y.o. male with end stage arthritis of his left hip with progressively worsening pain and dysfunction. Pain occurs with activity and rest including pain at night. He has tried analgesics, protected weight bearing and rest without benefit. Pain is too severe to attempt physical therapy. Radiographs demonstrate bone on bone arthritis. He presents now for left THA.  Laboratory Data: Admission on 11/07/2015  Component Date Value Ref Range Status  . WBC 11/08/2015 11.5* 4.0 - 10.5 K/uL Final  . RBC 11/08/2015 4.61  4.22 - 5.81 MIL/uL Final  . Hemoglobin 11/08/2015 12.9* 13.0 - 17.0 g/dL Final  . HCT 11/08/2015 38.4* 39.0 - 52.0 % Final  . MCV 11/08/2015 83.3  78.0 - 100.0 fL Final  . MCH 11/08/2015 28.0  26.0 - 34.0 pg Final  . MCHC 11/08/2015 33.6  30.0 - 36.0 g/dL Final  . RDW 11/08/2015 14.2  11.5 - 15.5 % Final  . Platelets 11/08/2015 222  150 - 400 K/uL Final  . Sodium 11/08/2015 138  135 - 145 mmol/L Final  . Potassium 11/08/2015 4.5  3.5 - 5.1 mmol/L Final  . Chloride 11/08/2015 107  101 - 111 mmol/L Final  . CO2 11/08/2015 22  22 - 32 mmol/L Final  . Glucose, Bld  11/08/2015 183* 65 - 99 mg/dL Final  . BUN 11/08/2015 15  6 - 20 mg/dL Final  . Creatinine, Ser 11/08/2015 1.19  0.61 - 1.24 mg/dL Final  . Calcium 11/08/2015 8.6* 8.9 - 10.3 mg/dL Final  . GFR calc non Af Amer 11/08/2015 >60  >60 mL/min Final  . GFR calc Af Amer 11/08/2015 >60  >60 mL/min Final   Comment: (NOTE) The eGFR has been calculated using the CKD EPI equation. This calculation has not been validated in all clinical situations. eGFR's persistently <60 mL/min signify possible Chronic Kidney Disease.   . Anion gap 11/08/2015 9  5 - 15 Final  . WBC 11/09/2015 14.1* 4.0 - 10.5 K/uL Final  . RBC 11/09/2015 4.41  4.22 - 5.81 MIL/uL Final  . Hemoglobin 11/09/2015 12.2* 13.0 - 17.0 g/dL Final  . HCT 11/09/2015 36.8* 39.0 - 52.0 % Final  . MCV 11/09/2015 83.4  78.0 - 100.0 fL Final  . MCH 11/09/2015 27.7  26.0 - 34.0 pg Final  . MCHC 11/09/2015 33.2  30.0 - 36.0 g/dL Final  . RDW 11/09/2015 14.3  11.5 - 15.5 % Final  . Platelets 11/09/2015 223  150 - 400 K/uL Final  . Sodium 11/09/2015 139  135 - 145 mmol/L Final  . Potassium 11/09/2015 4.4  3.5 - 5.1 mmol/L Final  .  Chloride 11/09/2015 106  101 - 111 mmol/L Final  . CO2 11/09/2015 24  22 - 32 mmol/L Final  . Glucose, Bld 11/09/2015 178* 65 - 99 mg/dL Final  . BUN 11/09/2015 23* 6 - 20 mg/dL Final  . Creatinine, Ser 11/09/2015 1.23  0.61 - 1.24 mg/dL Final  . Calcium 11/09/2015 8.4* 8.9 - 10.3 mg/dL Final  . GFR calc non Af Amer 11/09/2015 >60  >60 mL/min Final  . GFR calc Af Amer 11/09/2015 >60  >60 mL/min Final   Comment: (NOTE) The eGFR has been calculated using the CKD EPI equation. This calculation has not been validated in all clinical situations. eGFR's persistently <60 mL/min signify possible Chronic Kidney Disease.   Georgiann Hahn gap 11/09/2015 9  5 - 15 Final  Hospital Outpatient Visit on 11/01/2015  Component Date Value Ref Range Status  . MRSA, PCR 11/01/2015 NEGATIVE  NEGATIVE Final  . Staphylococcus aureus 11/01/2015  NEGATIVE  NEGATIVE Final   Comment:        The Xpert SA Assay (FDA approved for NASAL specimens in patients over 91 years of age), is one component of a comprehensive surveillance program.  Test performance has been validated by [facility] for patients greater than or equal to 99 year old. It is not intended to diagnose infection nor to guide or monitor treatment.   [name] aPTT 11/01/2015 31  24 - 37 seconds Final  . WBC 11/01/2015 6.7  4.0 - 10.5 K/uL Final  . RBC 11/01/2015 5.20  4.22 - 5.81 MIL/uL Final  . Hemoglobin 11/01/2015 13.9  13.0 - 17.0 g/dL Final  . HCT 11/01/2015 44.6  39.0 - 52.0 % Final  . MCV 11/01/2015 85.8  78.0 - 100.0 fL Final  . MCH 11/01/2015 26.7  26.0 - 34.0 pg Final  . MCHC 11/01/2015 31.2  30.0 - 36.0 g/dL Final  . RDW 11/01/2015 14.2  11.5 - 15.5 % Final  . Platelets 11/01/2015 228  150 - 400 K/uL Final  . Sodium 11/01/2015 144  135 - 145 mmol/L Final  . Potassium 11/01/2015 4.4  3.5 - 5.1 mmol/L Final  . Chloride 11/01/2015 110  101 - 111 mmol/L Final  . CO2 11/01/2015 24  22 - 32 mmol/L Final  . Glucose, Bld 11/01/2015 92  65 - 99 mg/dL Final  . BUN 11/01/2015 17  6 - 20 mg/dL Final  . Creatinine, Ser 11/01/2015 1.21  0.61 - 1.24 mg/dL Final  . Calcium 11/01/2015 9.3  8.9 - 10.3 mg/dL Final  . Total Protein 11/01/2015 7.2  6.5 - 8.1 g/dL Final  . Albumin 11/01/2015 3.9  3.5 - 5.0 g/dL Final  . AST 11/01/2015 30  15 - 41 U/L Final  . ALT 11/01/2015 37  17 - 63 U/L Final  . Alkaline Phosphatase 11/01/2015 61  38 - 126 U/L Final  . Total Bilirubin 11/01/2015 0.7  0.3 - 1.2 mg/dL Final  . GFR calc non Af Amer 11/01/2015 >60  >60 mL/min Final  . GFR calc Af Amer 11/01/2015 >60  >60 mL/min Final   Comment: (NOTE) The eGFR has been calculated using the CKD EPI equation. This calculation has not been validated in all clinical situations. eGFR's persistently <60 mL/min signify possible Chronic Kidney Disease.   . Anion gap 11/01/2015 10  5 - 15 Final  .  Prothrombin Time 11/01/2015 14.5  11.6 - 15.2 seconds Final  . INR 11/01/2015 1.16  0.00 - 1.49 Final  . ABO/RH(D) 11/01/2015 B NEG  Final  . Antibody Screen 11/01/2015 NEG   Final  . Sample Expiration 11/01/2015 11/10/2015   Final  . Extend sample reason 11/01/2015 NO TRANSFUSIONS OR PREGNANCY IN THE PAST 3 MONTHS   Final  . Color, Urine 11/01/2015 YELLOW  YELLOW Final  . APPearance 11/01/2015 CLEAR  CLEAR Final  . Specific Gravity, Urine 11/01/2015 1.021  1.005 - 1.030 Final  . pH 11/01/2015 5.5  5.0 - 8.0 Final  . Glucose, UA 11/01/2015 NEGATIVE  NEGATIVE mg/dL Final  . Hgb urine dipstick 11/01/2015 NEGATIVE  NEGATIVE Final  . Bilirubin Urine 11/01/2015 NEGATIVE  NEGATIVE Final  . Ketones, ur 11/01/2015 NEGATIVE  NEGATIVE mg/dL Final  . Protein, ur 11/01/2015 NEGATIVE  NEGATIVE mg/dL Final  . Nitrite 11/01/2015 NEGATIVE  NEGATIVE Final  . Leukocytes, UA 11/01/2015 NEGATIVE  NEGATIVE Final   MICROSCOPIC NOT DONE ON URINES WITH NEGATIVE PROTEIN, BLOOD, LEUKOCYTES, NITRITE, OR GLUCOSE <1000 mg/dL.     X-Rays:Dg Pelvis Portable  11/07/2015  CLINICAL DATA:  Status post total hip replacement on the left EXAM: PORTABLE PELVIS 1-2 VIEWS COMPARISON:  Pelvic MRI June 12, 2015 FINDINGS: Frontal view obtained. There are now total hip replacements bilaterally with prosthetic components appearing well-seated bilaterally. No acute fracture or dislocation. There is a surgical drain on the left. IMPRESSION: Total hip prosthetic components appear well seated bilaterally. No fracture or dislocation. Electronically Signed   By: Lowella Grip III M.D.   On: 11/07/2015 16:18    EKG: Orders placed or performed during the hospital encounter of 11/01/15  . EKG 12 lead  . EKG 12 lead     Hospital Course: Patient was admitted to [facility] and taken to the OR and underwent the above state procedure without complications.  Patient tolerated the procedure well and was later transferred to the  recovery room and then to the orthopaedic floor for postoperative care.  They were given PO and IV analgesics for pain control following their surgery.  They were given 24 hours of postoperative antibiotics of  Anti-infectives    Start     Dose/Rate Route Frequency Ordered Stop   11/07/15 1930  ceFAZolin (ANCEF) IVPB 2g/100 mL premix     2 g 200 mL/hr over 30 Minutes Intravenous Every 6 hours 11/07/15 1717 11/08/15 0151   11/07/15 0600  ceFAZolin (ANCEF) 3 g in dextrose 5 % 50 mL IVPB     3 g 130 mL/hr over 30 Minutes Intravenous On call to O.R. 11/06/15 1344 11/07/15 1331     and started on DVT prophylaxis in the form of Xarelto.   PT and OT were ordered for total hip protocol.  The patient was allowed to be WBAT with therapy. Discharge planning was consulted to help with postop disposition and equipment needs.  Patient had a decent night on the evening of surgery.  They started to get up OOB with therapy on day one.  Hemovac drain was pulled without difficulty.  The knee immobilizer was removed and discontinued.  Continued to work with therapy into day two.  Dressing was changed on day two and the incision was healing well. Patient was seen in rounds on day two by Dr. Wynelle Link and was ready to go home.   Diet: Cardiac diet Activity:WBAT No bending hip over 90 degrees- A "L" Angle Do not cross legs Do not let foot roll inward When turning these patients a pillow should be placed between the patient's legs to prevent crossing. Patients should have the affected knee  fully extended when trying to sit or stand from all surfaces to prevent excessive hip flexion. When ambulating and turning toward the affected side the affected leg should have the toes turned out prior to moving the walker and the rest of patient's body as to prevent internal rotation/ turning in of the leg. Abduction pillows are the most effective way to prevent a patient from not crossing legs or turning toes in at rest. If an  abduction pillow is not ordered placing a regular pillow length wise between the patient's legs is also an effective reminder. It is imperative that these precautions be maintained so that the surgical hip does not dislocate. Follow-up:in 2 weeks Disposition - Home Discharged Condition: good   Discharge Instructions    Call MD / Call 911    Complete by:  As directed   If you experience chest pain or shortness of breath, CALL 911 and be transported to the hospital emergency room.  If you develope a fever above 101 F, pus (white drainage) or increased drainage or redness at the wound, or calf pain, call your surgeon's office.     Change dressing    Complete by:  As directed   You may change your dressing dressing daily with sterile 4 x 4 inch gauze dressing and paper tape.  Do not submerge the incision under water.     Constipation Prevention    Complete by:  As directed   Drink plenty of fluids.  Prune juice may be helpful.  You may use a stool softener, such as Colace (over the counter) 100 mg twice a day.  Use MiraLax (over the counter) for constipation as needed.     Diet - low sodium heart healthy    Complete by:  As directed      Discharge instructions    Complete by:  As directed   Pick up stool softner and laxative for home use following surgery while on pain medications. Do not submerge incision under water. Please use good hand washing techniques while changing dressing each day. May shower starting three days after surgery. Please use a clean towel to pat the incision dry following showers. Continue to use ice for pain and swelling after surgery. Do not use any lotions or creams on the incision until instructed by your surgeon. Hip precautions.  Total Hip Protocol.  Take Xarelto for two and a half more weeks, then discontinue Xarelto. Once the patient has completed the Xarelto, they may resume the 81 mg Aspirin.  Postoperative Constipation Protocol  Constipation - defined  medically as fewer than three stools per week and severe constipation as less than one stool per week.  One of the most common issues patients have following surgery is constipation.  Even if you have a regular bowel pattern at home, your normal regimen is likely to be disrupted due to multiple reasons following surgery.  Combination of anesthesia, postoperative narcotics, change in appetite and fluid intake all can affect your bowels.  In order to avoid complications following surgery, here are some recommendations in order to help you during your recovery period.  Colace (docusate) - Pick up an over-the-counter form of Colace or another stool softener and take twice a day as long as you are requiring postoperative pain medications.  Take with a full glass of water daily.  If you experience loose stools or diarrhea, hold the colace until you stool forms back up.  If your symptoms do not get better within 1 week  or if they get worse, check with your doctor.  Dulcolax (bisacodyl) - Pick up over-the-counter and take as directed by the product packaging as needed to assist with the movement of your bowels.  Take with a full glass of water.  Use this product as needed if not relieved by Colace only.   MiraLax (polyethylene glycol) - Pick up over-the-counter to have on hand.  MiraLax is a solution that will increase the amount of water in your bowels to assist with bowel movements.  Take as directed and can mix with a glass of water, juice, soda, coffee, or tea.  Take if you go more than two days without a movement. Do not use MiraLax more than once per day. Call your doctor if you are still constipated or irregular after using this medication for 7 days in a row.  If you continue to have problems with postoperative constipation, please contact the office for further assistance and recommendations.  If you experience "the worst abdominal pain ever" or develop nausea or vomiting, please contact the office  immediatly for further recommendations for treatment.     Do not sit on low chairs, stoools or toilet seats, as it may be difficult to get up from low surfaces    Complete by:  As directed      Driving restrictions    Complete by:  As directed   No driving until released by the physician.     Follow the hip precautions as taught in Physical Therapy    Complete by:  As directed      Increase activity slowly as tolerated    Complete by:  As directed      Lifting restrictions    Complete by:  As directed   No lifting until released by the physician.     Patient may shower    Complete by:  As directed   You may shower without a dressing once there is no drainage.  Do not wash over the wound.  If drainage remains, do not shower until drainage stops.     TED hose    Complete by:  As directed   Use stockings (TED hose) for 3 weeks on both leg(s).  You may remove them at night for sleeping.     Weight bearing as tolerated    Complete by:  As directed   Laterality:  left  Extremity:  Lower            Medication List    STOP taking these medications        aspirin EC 81 MG tablet     meloxicam 15 MG tablet  Commonly known as:  MOBIC      TAKE these medications        methocarbamol 500 MG tablet  Commonly known as:  ROBAXIN  Take 1 tablet (500 mg total) by mouth every 6 (six) hours as needed for muscle spasms.     metoprolol succinate 25 MG 24 hr tablet  Commonly known as:  TOPROL-XL  Take 25 mg by mouth daily.     oxyCODONE 5 MG immediate release tablet  Commonly known as:  Oxy IR/ROXICODONE  Take 1-2 tablets (5-10 mg total) by mouth every 3 (three) hours as needed for moderate pain or severe pain.     rivaroxaban 10 MG Tabs tablet  Commonly known as:  XARELTO  Take 1 tablet (10 mg total) by mouth daily with breakfast. Take Xarelto for two and a half more  weeks, then discontinue Xarelto. Once the patient has completed the Xarelto, they may resume the 81 mg Aspirin.      traMADol 50 MG tablet  Commonly known as:  ULTRAM  Take 1-2 tablets (50-100 mg total) by mouth every 6 (six) hours as needed (mild pain).           Follow-up Information    Follow up with Gearlean Alf, MD. Schedule an appointment as soon as possible for a visit on 11/20/2015.   Specialty:  Orthopedic Surgery   Why:  Call office at [phone] to setup appointment on Tuesday 11/20/15 with Dr. Wynelle Link.   Contact information:   [address] [place] [address] [phone]       Signed: [name], PA-C Orthopaedic Surgery 11/09/2015, 8:06 AM

## 2016-02-15 ENCOUNTER — Ambulatory Visit: Payer: 59 | Admitting: Cardiology

## 2016-02-27 ENCOUNTER — Ambulatory Visit (INDEPENDENT_AMBULATORY_CARE_PROVIDER_SITE_OTHER): Payer: 59 | Admitting: Cardiology

## 2016-02-27 ENCOUNTER — Encounter: Payer: Self-pay | Admitting: Cardiology

## 2016-02-27 VITALS — BP 124/81 | HR 60 | Ht 74.0 in | Wt >= 6400 oz

## 2016-02-27 DIAGNOSIS — I5033 Acute on chronic diastolic (congestive) heart failure: Secondary | ICD-10-CM

## 2016-02-27 DIAGNOSIS — R609 Edema, unspecified: Secondary | ICD-10-CM | POA: Diagnosis not present

## 2016-02-27 DIAGNOSIS — R0602 Shortness of breath: Secondary | ICD-10-CM | POA: Diagnosis not present

## 2016-02-27 MED ORDER — FUROSEMIDE 40 MG PO TABS: 40.0000 mg | ORAL_TABLET | Freq: Every day | ORAL | Status: DC

## 2016-02-27 NOTE — Patient Instructions (Signed)
Your physician recommends that you schedule a follow-up appointment in: 3-4 WEEKS WITH DR. [place]  Your physician has recommended you make the following change in your medication:   START LASIX 40 MG DAILY  Your physician recommends that you return for lab work in: 1 WEEK BMP/MG  PLEASE UPDATE [place] NEXT WEEK WITH YOUR WEIGHT AND SWELLING  Thank you for choosing [facility]!!

## 2016-02-27 NOTE — Progress Notes (Signed)
Clinical Summary [name] is a 55 y.o.male seen today as a new patient, he is referred by Dr Marcus Hutchinson.  1. SOB - started approximately 5-6 months ago. Occurs with activity, DOE walking room to room at home - occasional chest pain left sided, stinging 2-3/10. No other associated symptoms. Lasts < 9minute. Occurs few times a week. Can occur at rest or with exertion.  - some recent LE edema - echo [facility] 01/2016: "technically limited study, LVEF WNL, diastolic function is reduced" - GXT 2013 no ischemia, though only exercised 4 minutes - heart cath 2004 with normal coroarines - weight in 10/2015 370 lbs. Home weight most recently up to 408 lbs.     Past Medical History  Diagnosis Date  . Sleep apnea     cpap use pressure 17  . Hypertension   . History of kidney stones     x 3 episodes- none in awhile.  . Arthritis     osteoarthritis, spinal stenosis     Allergies  Allergen Reactions  . Latex Other (See Comments)    Blisters skin  . Tape Other (See Comments)    Blisters skin     Current Outpatient Prescriptions  Medication Sig Dispense Refill  . aspirin EC 81 MG tablet Take 81 mg by mouth daily.    [name] buPROPion (WELLBUTRIN XL) 150 MG 24 hr tablet Take 150 mg by mouth 2 (two) times daily.  3  . meclizine (ANTIVERT) 25 MG tablet Take 0.5 tablets by mouth 2 (two) times daily as needed.  2  . metoprolol succinate (TOPROL-XL) 25 MG 24 hr tablet Take 25 mg by mouth daily.  3  . Naproxen Sodium (ALEVE PO) Take 1 tablet by mouth every 6 (six) hours as needed.    . furosemide (LASIX) 40 MG tablet Take 1 tablet (40 mg total) by mouth daily. 90 tablet 3   No current facility-administered medications for this visit.     Past Surgical History  Procedure Laterality Date  . Cardiac catheterization      10 yrs ago-normal  . Joint replacement Right     RTHA- '11  . Shoulder arthroscopy distal clavicle excision and open rotator cuff repair Right    '09  . Cholecystectomy      laparoscopic  . Hernia repair Left     LIH(left inguinal Hernia) repair  . Finger surgery Right     little finger tip reattachment(slight decrease sensation).  . Total hip arthroplasty Left 11/07/2015    Procedure: LEFT TOTAL HIP ARTHROPLASTY POSTERIOR  APPROACH;  Surgeon: Marcus Arabian, MD;  Location: [facility];  Service: Orthopedics;  Laterality: Left;     Allergies  Allergen Reactions  . Latex Other (See Comments)    Blisters skin  . Tape Other (See Comments)    Blisters skin      Family History  Problem Relation Age of Onset  . Arthritis Mother   . Diabetes Mother   . Heart disease Father     stent placement  . Heart attack Father   . Heart failure Maternal Grandfather   . Stroke Maternal Uncle   . Heart attack Paternal Uncle      Social History Marcus Hutchinson reports that he quit smoking about 31 years ago. His smoking use included Cigarettes. He started smoking about 36 years ago. He has a 3.5 pack-year smoking history. He has never used smokeless tobacco. Marcus Hutchinson reports that he does not  drink alcohol.   Review of Systems CONSTITUTIONAL: No weight loss, fever, chills, weakness or fatigue.  HEENT: Eyes: No visual loss, blurred vision, double vision or yellow sclerae.No hearing loss, sneezing, congestion, runny nose or sore throat.  SKIN: No rash or itching.  CARDIOVASCULAR: per HPI RESPIRATORY: per HPI GASTROINTESTINAL: No anorexia, nausea, vomiting or diarrhea. No abdominal pain or blood.  GENITOURINARY: No burning on urination, no polyuria NEUROLOGICAL: No headache, dizziness, syncope, paralysis, ataxia, numbness or tingling in the extremities. No change in bowel or bladder control.  MUSCULOSKELETAL: No muscle, back pain, joint pain or stiffness.  LYMPHATICS: No enlarged nodes. No history of splenectomy.  PSYCHIATRIC: No history of depression or anxiety.  ENDOCRINOLOGIC: No reports of sweating, cold or heat intolerance. No  polyuria or polydipsia.  [name]   Physical Examination Filed Vitals:   02/27/16 1012  BP: 124/81  Pulse: 60   Filed Weights   02/27/16 1012  Weight: 416 lb (188.696 kg)    Gen: resting comfortably, no acute distress HEENT: no scleral icterus, pupils equal round and reactive, no palptable cervical adenopathy,  CV: RRR, no m/r/g, no jvd Resp: Clear to auscultation bilaterally GI: abdomen is soft, non-tender, non-distended, normal bowel sounds, no hepatosplenomegaly MSK: extremities are warm, 1+ bilateral LE edema Skin: warm, no rash Neuro:  no focal deficits Psych: appropriate affect   Diagnostic Studies  03/2003 cath RESULTS:  HEMODYNAMICS: Left ventricular pressure 116/20. Aortic pressure 120/84. There is no aortic valve gradient.  LEFT VENTRICULOGRAM: Wall motion is normal. Ejection fraction is estimated at greater than or equal to 60%. There is no mitral regurgitation.  CORONARY ARTERIOGRAPHY: (Right dominant) Left main is normal.  Left anterior descending artery has minor luminal irregularities in the proximal vessel. The LAD gives rise to a single normal sized diagonal Marcus Hutchinson.  Left circumflex has minor luminal irregularities in the proximal vessel. The circumflex gives rise to a small but normal-sized first marginal and a large second marginal Marcus Hutchinson.  Right coronary artery is a large, dominant vessel. It gives rise to a large posterior descending artery and a normal sized posterolateral Marcus Hutchinson. The posterior descending artery has minor luminal irregularities.   IMPRESSION: 1. Normal left ventricular systolic function. 2. No significant coronary artery disease identified.    Assessment and Plan  1. SOB/Acute on chronic diastolic HF - nearly 40 lbs weight gain over the last 4 months. His body habitus makes evaluation of fluid status difficult by exam, but I suspect he has some component of diastolic HF contributing to his  SOB - atypical chest pain, do not see indication for ischemic testing at this time. Given his weight if indicated will be difficult logistically, and stress imaging quality would be affected.  - we will start lasix 40mg  daily, check BMET and Mg in 1 week. Asked him to call and update [place] on his swelling and weight in 1 week.   F/u 3-4 weeks.       Arnoldo Lenis, M.D.

## 2016-02-29 ENCOUNTER — Other Ambulatory Visit: Payer: Self-pay | Admitting: Neurosurgery

## 2016-03-01 ENCOUNTER — Encounter: Payer: Self-pay | Admitting: Cardiology

## 2016-03-03 ENCOUNTER — Other Ambulatory Visit (HOSPITAL_COMMUNITY): Payer: Self-pay | Admitting: Neurosurgery

## 2016-03-03 DIAGNOSIS — M5416 Radiculopathy, lumbar region: Secondary | ICD-10-CM

## 2016-03-05 ENCOUNTER — Encounter: Payer: Self-pay | Admitting: *Deleted

## 2016-03-06 ENCOUNTER — Telehealth: Payer: Self-pay | Admitting: *Deleted

## 2016-03-06 ENCOUNTER — Encounter: Payer: Self-pay | Admitting: *Deleted

## 2016-03-06 NOTE — Telephone Encounter (Signed)
Pt f/u for weight and swelling recent [place] ed for pneumonia - d/c'd lasix - pt said weight is down by 5lbs - pt says he still having swelling in both feet. Has f/u appt with Dr. Woody Seller on Monday. Will forward to Dr. Harl Bowie

## 2016-03-06 NOTE — Telephone Encounter (Signed)
Records from [facility] already requested. Pt doesn't know why lasix was stopped

## 2016-03-06 NOTE — Telephone Encounter (Signed)
D/c summary received - doesn't not mention for pt to hold lasix - called pt to question and he says that he was told by Dr. Woody Seller to hold lasix until Monday appt with Dr. Woody Seller for kidney function (will have lab results scanned in). BUN 25 Creatinine 1.49 at discharge.

## 2016-03-06 NOTE — Telephone Encounter (Signed)
Does he know what they stopped his lasix? Can we get the ER records?   Zandra Abts MD

## 2016-03-07 NOTE — Telephone Encounter (Signed)
Ok to hold lasix until f/u with Dr Sande Brothers MD

## 2016-03-14 ENCOUNTER — Other Ambulatory Visit: Payer: Self-pay | Admitting: Radiology

## 2016-03-14 MED ORDER — DIAZEPAM 2 MG PO TABS: 10.0000 mg | ORAL_TABLET | Freq: Once | ORAL | Status: DC

## 2016-03-19 ENCOUNTER — Ambulatory Visit (HOSPITAL_COMMUNITY)
Admission: RE | Admit: 2016-03-19 | Discharge: 2016-03-19 | Disposition: A | Discharge status: Home / Self Care | Payer: 59 | Source: Ambulatory Visit | Attending: Neurosurgery | Admitting: Neurosurgery

## 2016-03-19 DIAGNOSIS — M5117 Intervertebral disc disorders with radiculopathy, lumbosacral region: Secondary | ICD-10-CM | POA: Insufficient documentation

## 2016-03-19 DIAGNOSIS — M4806 Spinal stenosis, lumbar region: Secondary | ICD-10-CM | POA: Insufficient documentation

## 2016-03-19 DIAGNOSIS — M5416 Radiculopathy, lumbar region: Secondary | ICD-10-CM

## 2016-03-19 MED ORDER — DIAZEPAM 5 MG PO TABS: 10.0000 mg | ORAL_TABLET | Freq: Once | ORAL | Status: DC

## 2016-03-19 MED ORDER — HYDROCODONE-ACETAMINOPHEN 5-325 MG PO TABS
1.0000 | ORAL_TABLET | Freq: Four times a day (QID) | ORAL | Status: DC | PRN
Start: 2016-03-19 — End: 2016-03-20

## 2016-03-19 MED ORDER — IOPAMIDOL (ISOVUE-M 200) INJECTION 41%
INTRAMUSCULAR | Status: AC
  Administered 2016-03-19: 20 mL via INTRATHECAL
  Filled 2016-03-19: qty 10

## 2016-03-19 MED ORDER — HYDROCODONE-ACETAMINOPHEN 5-325 MG PO TABS
ORAL_TABLET | ORAL | Status: AC
  Administered 2016-03-19: 1
  Filled 2016-03-19: qty 1

## 2016-03-19 MED ORDER — LIDOCAINE HCL (PF) 1 % IJ SOLN
10.0000 mL | Freq: Once | INTRAMUSCULAR | Status: AC
  Administered 2016-03-19: 10 mL via INTRADERMAL

## 2016-03-19 MED ORDER — LIDOCAINE HCL 1 % IJ SOLN
INTRAMUSCULAR | Status: AC
  Filled 2016-03-19: qty 10

## 2016-03-19 MED ORDER — IOPAMIDOL (ISOVUE-M 200) INJECTION 41%
20.0000 mL | Freq: Once | INTRAMUSCULAR | Status: AC
  Administered 2016-03-19: 20 mL via INTRATHECAL

## 2016-03-19 MED ORDER — HYDROCODONE-ACETAMINOPHEN 7.5-325 MG PO TABS
1.0000 | ORAL_TABLET | Freq: Four times a day (QID) | ORAL | Status: DC | PRN
  Filled 2016-03-19: qty 2

## 2016-03-19 MED ORDER — ONDANSETRON HCL 4 MG/2ML IJ SOLN: 4.0000 mg | Freq: Four times a day (QID) | INTRAMUSCULAR | Status: DC | PRN

## 2016-03-19 NOTE — Discharge Instructions (Signed)
Myelography, Care After °These instructions give you information on caring for yourself after your procedure. Your doctor may also give you more specific instructions. Call your doctor if you have any problems or questions after your procedure. °HOME CARE °· Rest the first day. °· When you rest, lie flat, with your head slightly raised (elevated). °· Avoid heavy lifting and activity for 48 hours, or as told by your doctor. °· You may take the bandage (dressing) off one day after the test, or as told by your doctor. °· Take all medicines only as told by your doctor. °· Ask your doctor when it is okay to take a shower or bath. °· Ask your doctor when your test results will be ready and how you can get them. Make sure you follow up and get your results. °· Do not drink alcohol for 24 hours, or as told by your doctor. °· Drink enough fluid to keep your pee (urine) clear or pale yellow. °GET HELP IF:  °· You have a fever. °· You have a headache. °· You feel sick to your stomach (nauseous) or throw up (vomit). °· You have pain or cramping in your belly (abdomen). °GET HELP RIGHT AWAY IF:  °· You have a headache with a stiff neck or fever. °· You have trouble breathing. °· Any of the places where the needles were put in are: °¨ Puffy (swollen) or red. °¨ Sore or hot to the touch. °¨ Draining yellowish-white fluid (pus). °¨ Bleeding. °MAKE SURE YOU: °· Understand these instructions. °· Will watch your condition. °· Will get help right away if you are not doing well or get worse. °  °This information is not intended to replace advice given to you by your health care provider. Make sure you discuss any questions you have with your health care provider. °  °Document Released: 05/06/2008 Document Revised: 08/18/2014 Document Reviewed: 04/21/2012 °Elsevier Interactive Patient Education ©2016 Elsevier Inc. ° °

## 2016-03-19 NOTE — Progress Notes (Signed)
Pt sat up and c/o his usual vertigo. Pt had emesis x2. Once he through up he felt better. Dr Carlis Abbott was paged/ he said as long as he is continuing to feel ok he may be DC home. Pt was ambulated and felt fine, pt was dc home. He is to go to urgent care with any further  Symptoms.

## 2016-03-31 ENCOUNTER — Encounter: Payer: Self-pay | Admitting: *Deleted

## 2016-04-01 ENCOUNTER — Ambulatory Visit (INDEPENDENT_AMBULATORY_CARE_PROVIDER_SITE_OTHER): Payer: 59 | Admitting: Cardiology

## 2016-04-01 ENCOUNTER — Encounter: Payer: Self-pay | Admitting: *Deleted

## 2016-04-01 ENCOUNTER — Encounter: Payer: Self-pay | Admitting: Cardiology

## 2016-04-01 VITALS — BP 132/82 | HR 69 | Ht 74.0 in | Wt >= 6400 oz

## 2016-04-01 DIAGNOSIS — R0602 Shortness of breath: Secondary | ICD-10-CM | POA: Diagnosis not present

## 2016-04-01 NOTE — Progress Notes (Signed)
Clinical Summary Marcus Hutchinson is a 55 y.o.male seen today for follow up of the following medical problems.   1. SOB - started approximately 5-6 months ago. Occurs with activity, DOE walking room to room at home - occasional chest pain left sided, stinging 2-3/10. No other associated symptoms. Lasts < 36minute. Occurs few times a week. Can occur at rest or with exertion.  - some recent LE edema - echo [facility] 01/2016: "technically limited study, LVEF WNL, diastolic function is reduced" - GXT 2013 no ischemia, though only exercised 4 minutes - heart cath 2004 with normal coronaries - weight in 10/2015 370 lbs. Home weight most recently up to 408 lbs.   - last visit given his weight gain and SOB he was started on lasix. In fairly short period of time his Cr and BUN elevated suggesting hypovolemia, and his lasix was initially held and then changed to lasix 40mg  every other day. SOB is unchanged.    Past Medical History:  Diagnosis Date  . Arthritis    osteoarthritis, spinal stenosis  . History of kidney stones    x 3 episodes- none in awhile.  [name] Hypertension   . Sleep apnea    cpap use pressure 17     Allergies  Allergen Reactions  . Latex Other (See Comments)    Blisters skin  . Tape Other (See Comments)    Blisters skin- paper tape seems to be ok     Current Outpatient Prescriptions  Medication Sig Dispense Refill  . aspirin EC 81 MG tablet Take 81 mg by mouth daily.    [name] buPROPion (WELLBUTRIN XL) 150 MG 24 hr tablet Take 150 mg by mouth 2 (two) times daily.  3  . furosemide (LASIX) 40 MG tablet Take 1 tablet (40 mg total) by mouth daily. 90 tablet 3  . meclizine (ANTIVERT) 25 MG tablet Take 12.5-25 mg by mouth See admin instructions. Take one tablet by mouth (25 mg) at bedtime as needed for dizziness/vertigo, may also take 1/2 tablet (12.5) 2 times during the day as needed for dizziness/vertigo  2  . metoprolol succinate (TOPROL-XL) 25 MG 24 hr tablet Take  25 mg by mouth daily.  3  . naproxen sodium (ALEVE) 220 MG tablet Take 440 mg by mouth 2 (two) times daily with a meal.    . tamsulosin (FLOMAX) 0.4 MG CAPS capsule Take 0.4 mg by mouth at bedtime.  3   No current facility-administered medications for this visit.      Past Surgical History:  Procedure Laterality Date  . CARDIAC CATHETERIZATION     10 yrs ago-normal  . CHOLECYSTECTOMY     laparoscopic  . FINGER SURGERY Right    little finger tip reattachment(slight decrease sensation).  . HERNIA REPAIR Left    LIH(left inguinal Hernia) repair  . JOINT REPLACEMENT Right    RTHA- '11  . SHOULDER ARTHROSCOPY DISTAL CLAVICLE EXCISION AND OPEN ROTATOR CUFF REPAIR Right    '09  . TOTAL HIP ARTHROPLASTY Left 11/07/2015   Procedure: LEFT TOTAL HIP ARTHROPLASTY POSTERIOR  APPROACH;  Surgeon: Gaynelle Arabian, MD;  Location: [facility];  Service: Orthopedics;  Laterality: Left;     Allergies  Allergen Reactions  . Latex Other (See Comments)    Blisters skin  . Tape Other (See Comments)    Blisters skin- paper tape seems to be ok      Family History  Problem Relation Age of Onset  . Arthritis Mother   .  Diabetes Mother   . Heart disease Father     stent placement  . Heart attack Father   . Heart failure Maternal Grandfather   . Stroke Maternal Uncle   . Heart attack Paternal Uncle      Social History Marcus Hutchinson reports that he quit smoking about 32 years ago. His smoking use included Cigarettes. He started smoking about 36 years ago. He has a 3.50 pack-year smoking history. He has never used smokeless tobacco. Marcus Hutchinson reports that he does not drink alcohol.   Review of Systems CONSTITUTIONAL: No weight loss, fever, chills, weakness or fatigue.  HEENT: Eyes: No visual loss, blurred vision, double vision or yellow sclerae.No hearing loss, sneezing, congestion, runny nose or sore throat.  SKIN: No rash or itching.  CARDIOVASCULAR: no chest pain, no palpitations.    RESPIRATORY: per HPI GASTROINTESTINAL: No anorexia, nausea, vomiting or diarrhea. No abdominal pain or blood.  GENITOURINARY: No burning on urination, no polyuria NEUROLOGICAL: No headache, dizziness, syncope, paralysis, ataxia, numbness or tingling in the extremities. No change in bowel or bladder control.  MUSCULOSKELETAL: No muscle, back pain, joint pain or stiffness.  LYMPHATICS: No enlarged nodes. No history of splenectomy.  PSYCHIATRIC: No history of depression or anxiety.  ENDOCRINOLOGIC: No reports of sweating, cold or heat intolerance. No polyuria or polydipsia.  [name]   Physical Examination There were no vitals filed for this visit. There were no vitals filed for this visit.  Gen: resting comfortably, no acute distress HEENT: no scleral icterus, pupils equal round and reactive, no palptable cervical adenopathy,  CV Resp: Clear to auscultation bilaterally GI: abdomen is soft, non-tender, non-distended, normal bowel sounds, no hepatosplenomegaly MSK: extremities are warm, no edema.  Skin: warm, no rash Neuro:  no focal deficits Psych: appropriate affect   Diagnostic Studies 03/2003 cath RESULTS:  HEMODYNAMICS: Left ventricular pressure 116/20. Aortic pressure 120/84. There is no aortic valve gradient.  LEFT VENTRICULOGRAM: Wall motion is normal. Ejection fraction is estimated at greater than or equal to 60%. There is no mitral regurgitation.  CORONARY ARTERIOGRAPHY: (Right dominant) Left main is normal.  Left anterior descending artery has minor luminal irregularities in the proximal vessel. The LAD gives rise to a single normal sized diagonal branch.  Left circumflex has minor luminal irregularities in the proximal vessel. The circumflex gives rise to a small but normal-sized first marginal and a large second marginal branch.  Right coronary artery is a large, dominant vessel. It gives rise to a large posterior descending artery and a  normal sized posterolateral branch. The posterior descending artery has minor luminal irregularities.   IMPRESSION: 1. Normal left ventricular systolic function. 2. No significant coronary artery disease identified.    Assessment and Plan  1. SOB - in setting of his diastolic dysfunction and significant weight gain was initially thought to be acute on chronic diastolic HF. Started on lasix, in a matter of just a few days however his Cr/BUN elevated indicating hypovolemia. Lasix has since been changed to every other day - no other abnormalities note on recent echo. Symptoms not overally consistent with ischemia, he has had negative cath in 2004 and negative exercise stress in 2013. Noninvasive ischemic testing for him is of low yield given his body habitus, there is not objective evidedence at this time to support invasive testing. - symptoms may be due to significant obesity and deconditioning. I have encouraged him to make efforts for weight loss and increased exercise. He will f/u in 2 months and reevaluate  symptoms, at that time may consider RHC/LHC depending on symptoms.  - we will order PFTs, risk for obesity hypoventilation syndrome.    F/u 2 months.      Arnoldo Lenis, M.D.

## 2016-04-01 NOTE — Patient Instructions (Signed)
Your physician recommends that you schedule a follow-up appointment in: 2 MONTHS WITH DR. Friendship  Your physician recommends that you continue on your current medications as directed. Please refer to the Current Medication list given to you today.  Your physician has recommended that you have a pulmonary function test. Pulmonary Function Tests are a group of tests that measure how well air moves in and out of your lungs.  Thank you for choosing [facility]!!

## 2016-04-03 ENCOUNTER — Other Ambulatory Visit: Payer: Self-pay | Admitting: Neurosurgery

## 2016-04-03 DIAGNOSIS — M4316 Spondylolisthesis, lumbar region: Secondary | ICD-10-CM

## 2016-04-09 ENCOUNTER — Ambulatory Visit (HOSPITAL_COMMUNITY)
Admission: RE | Admit: 2016-04-09 | Discharge: 2016-04-09 | Disposition: A | Discharge status: Home / Self Care | Payer: 59 | Source: Ambulatory Visit | Attending: Cardiology | Admitting: Cardiology

## 2016-04-09 ENCOUNTER — Telehealth: Payer: Self-pay | Admitting: Cardiology

## 2016-04-09 DIAGNOSIS — R0602 Shortness of breath: Secondary | ICD-10-CM | POA: Diagnosis not present

## 2016-04-09 DIAGNOSIS — J984 Other disorders of lung: Secondary | ICD-10-CM | POA: Insufficient documentation

## 2016-04-09 LAB — PULMONARY FUNCTION TEST
DL/VA % PRED: 66 %
DL/VA: 3.23 ml/min/mmHg/L
DLCO unc % pred: 52 %
DLCO unc: 19.41 ml/min/mmHg
FEF 25-75 POST: 5.23 L/s
FEF 25-75 Pre: 4.36 L/sec
FEF2575-%CHANGE-POST: 19 %
FEF2575-%PRED-POST: 151 %
FEF2575-%PRED-PRE: 125 %
FEV1-%Change-Post: 3 %
FEV1-%Pred-Post: 82 %
FEV1-%Pred-Pre: 79 %
FEV1-Post: 3.43 L
FEV1-Pre: 3.3 L
FEV1FVC-%CHANGE-POST: 4 %
FEV1FVC-%Pred-Pre: 113 %
FEV6-%CHANGE-POST: -1 %
FEV6-%Pred-Post: 72 %
FEV6-%Pred-Pre: 72 %
FEV6-Post: 3.78 L
FEV6-Pre: 3.82 L
FEV6FVC-%Pred-Post: 104 %
FEV6FVC-%Pred-Pre: 104 %
FVC-%CHANGE-POST: -1 %
FVC-%PRED-POST: 69 %
FVC-%PRED-PRE: 69 %
FVC-PRE: 3.82 L
FVC-Post: 3.78 L
POST FEV1/FVC RATIO: 91 %
PRE FEV1/FVC RATIO: 86 %
Post FEV6/FVC ratio: 100 %
Pre FEV6/FVC Ratio: 100 %
RV % pred: 93 %
RV: 2.22 L
TLC % pred: 83 %
TLC: 6.38 L

## 2016-04-09 MED ORDER — ALBUTEROL SULFATE (2.5 MG/3ML) 0.083% IN NEBU
2.5000 mg | INHALATION_SOLUTION | Freq: Once | RESPIRATORY_TRACT | Status: AC
  Administered 2016-04-09: 2.5 mg via RESPIRATORY_TRACT

## 2016-04-09 NOTE — Telephone Encounter (Signed)
Will forward results to Dr. Woody Seller once resulted. Pt having PFT's done today

## 2016-04-09 NOTE — Telephone Encounter (Signed)
Patient had a PFT test at [facility] and would like results to Dr Woody Seller also.

## 2016-04-11 ENCOUNTER — Telehealth: Payer: Self-pay | Admitting: Cardiology

## 2016-04-11 NOTE — Telephone Encounter (Signed)
Calling to see what results were for PFT test

## 2016-04-11 NOTE — Telephone Encounter (Signed)
Wife Constance Holster) informed that test results are on Dr. Harl Bowie desk top awaiting his review.  Will notify as soon as possible.  She verbalized understanding.

## 2016-04-15 ENCOUNTER — Inpatient Hospital Stay
Admission: RE | Admit: 2016-04-15 | Discharge: 2016-04-15 | Disposition: A | Discharge status: Home / Self Care | Payer: 59 | Source: Ambulatory Visit | Attending: Neurosurgery | Admitting: Neurosurgery

## 2016-04-15 ENCOUNTER — Telehealth: Payer: Self-pay | Admitting: *Deleted

## 2016-04-15 DIAGNOSIS — R942 Abnormal results of pulmonary function studies: Secondary | ICD-10-CM

## 2016-04-15 NOTE — Telephone Encounter (Signed)
Pt aware, referral placed and forwarded to schedulers- routed to pcp and Dr. Luan Pulling

## 2016-04-15 NOTE — Telephone Encounter (Signed)
-----   Message from Arnoldo Lenis, MD sent at 04/15/2016  9:50 AM EDT ----- Breathing tests are mild to moderately abnormal, showing that his lungs ability to absorb oxygen is somewhat decreased. Please refer to Dr Luan Pulling to help further evaluate, this may be related to his symptoms of SOB.  J BrancH MD

## 2016-04-22 ENCOUNTER — Other Ambulatory Visit (HOSPITAL_COMMUNITY): Payer: Self-pay | Admitting: Pulmonary Disease

## 2016-04-22 DIAGNOSIS — R0602 Shortness of breath: Secondary | ICD-10-CM

## 2016-04-23 ENCOUNTER — Ambulatory Visit (HOSPITAL_COMMUNITY)
Admission: RE | Admit: 2016-04-23 | Discharge: 2016-04-23 | Disposition: A | Discharge status: Home / Self Care | Payer: 59 | Source: Ambulatory Visit | Attending: Pulmonary Disease | Admitting: Pulmonary Disease

## 2016-04-23 DIAGNOSIS — J9811 Atelectasis: Secondary | ICD-10-CM | POA: Diagnosis not present

## 2016-04-23 DIAGNOSIS — M47894 Other spondylosis, thoracic region: Secondary | ICD-10-CM | POA: Insufficient documentation

## 2016-04-23 DIAGNOSIS — R0602 Shortness of breath: Secondary | ICD-10-CM | POA: Insufficient documentation

## 2016-04-23 DIAGNOSIS — K760 Fatty (change of) liver, not elsewhere classified: Secondary | ICD-10-CM | POA: Diagnosis not present

## 2016-04-23 MED ORDER — IOPAMIDOL (ISOVUE-370) INJECTION 76%
100.0000 mL | Freq: Once | INTRAVENOUS | Status: AC | PRN
  Administered 2016-04-23: 100 mL via INTRAVENOUS

## 2016-05-08 ENCOUNTER — Ambulatory Visit (HOSPITAL_COMMUNITY): Payer: 59 | Attending: Pulmonary Disease

## 2016-05-08 DIAGNOSIS — R0602 Shortness of breath: Secondary | ICD-10-CM | POA: Insufficient documentation

## 2016-05-09 DIAGNOSIS — R0602 Shortness of breath: Secondary | ICD-10-CM | POA: Diagnosis not present

## 2016-06-03 ENCOUNTER — Ambulatory Visit: Payer: 59 | Admitting: Cardiology

## 2017-01-06 IMAGING — DX DG PORTABLE PELVIS
2 series · 2 of 2 positions shown · non-contrast
Comparison: Pelvic MRI June 12, 2015

CLINICAL DATA: Status post total hip replacement on the left

EXAM:
PORTABLE PELVIS 1-2 VIEWS

[pelvis ap (1 of 2)]
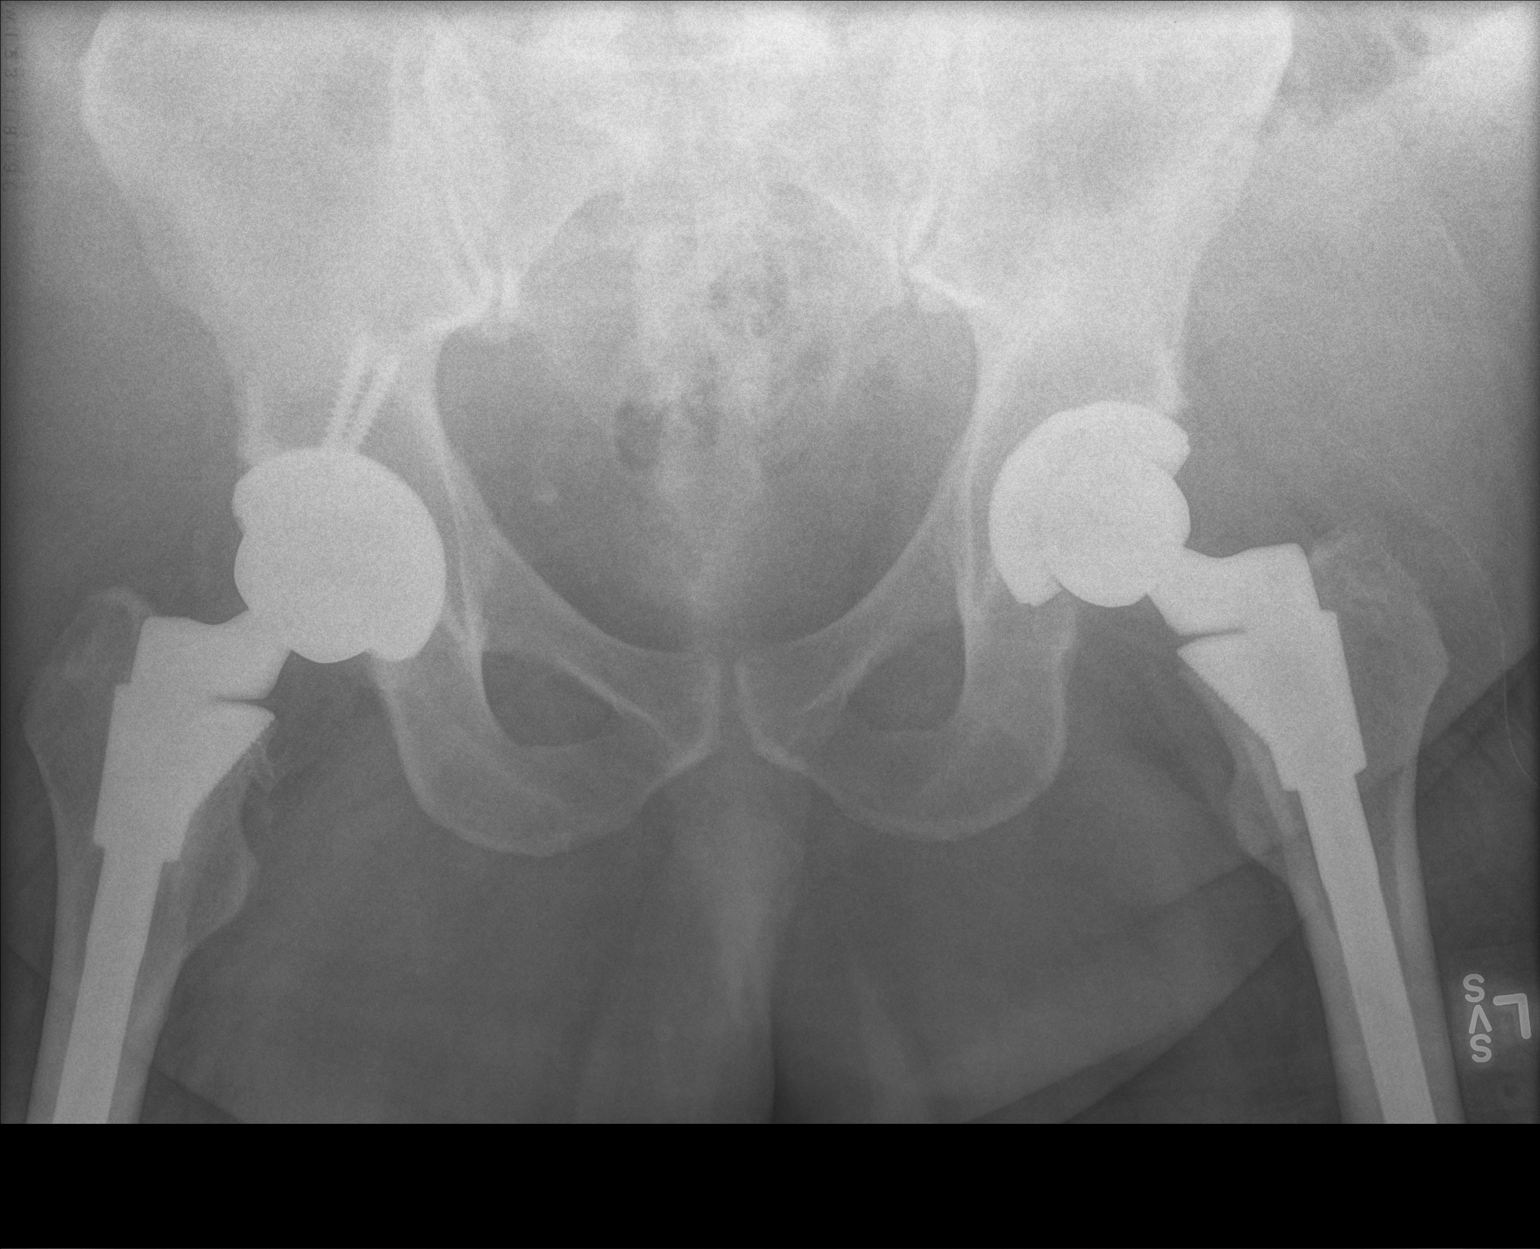

[pelvis ap (2 of 2)]
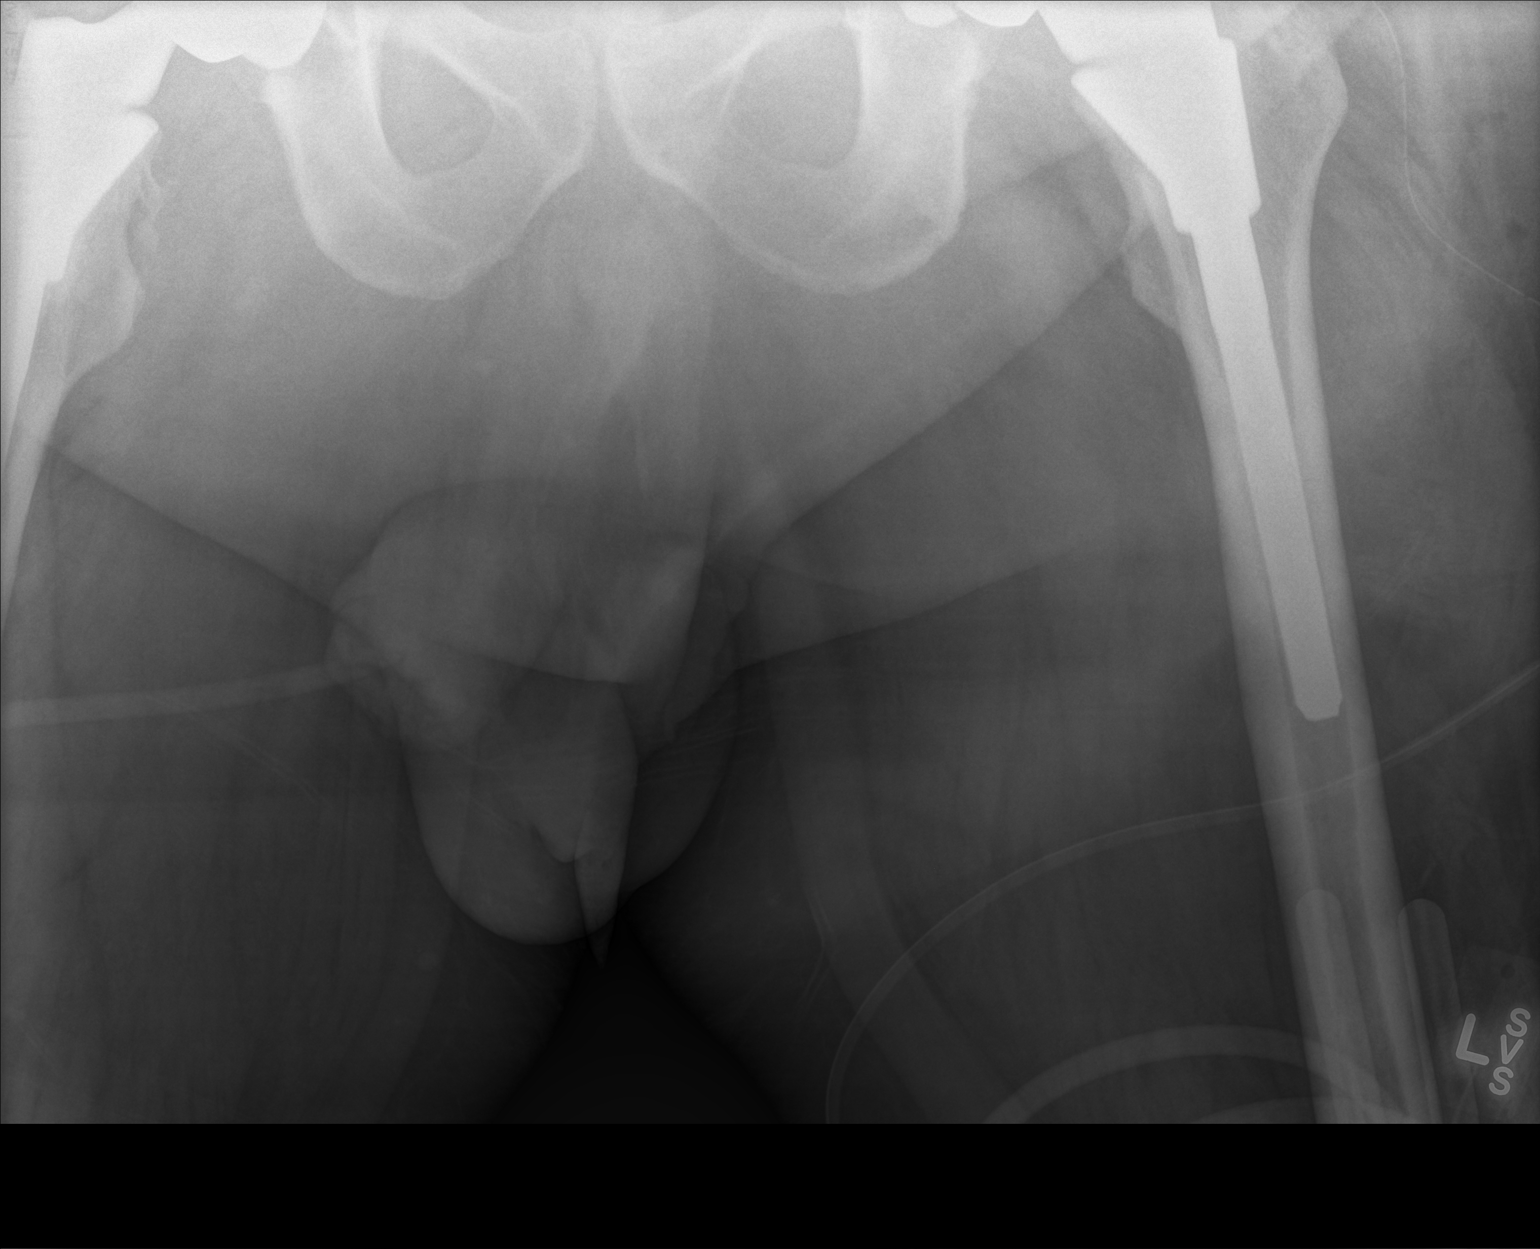

[2 of 2 positions shown; findings below may reference images not displayed]

FINDINGS: Frontal view obtained. There are now total hip replacements
bilaterally with prosthetic components appearing well-seated
bilaterally. No acute fracture or dislocation. There is a surgical
drain on the left.
IMPRESSION: Total hip prosthetic components appear well seated bilaterally. No
fracture or dislocation.

## 2017-09-03 ENCOUNTER — Ambulatory Visit (INDEPENDENT_AMBULATORY_CARE_PROVIDER_SITE_OTHER): Payer: BLUE CROSS/BLUE SHIELD | Admitting: Otolaryngology

## 2017-09-03 DIAGNOSIS — H6981 Other specified disorders of Eustachian tube, right ear: Secondary | ICD-10-CM | POA: Diagnosis not present

## 2017-09-03 DIAGNOSIS — H6521 Chronic serous otitis media, right ear: Secondary | ICD-10-CM

## 2017-09-03 DIAGNOSIS — J342 Deviated nasal septum: Secondary | ICD-10-CM

## 2017-09-03 DIAGNOSIS — J310 Chronic rhinitis: Secondary | ICD-10-CM

## 2017-09-03 DIAGNOSIS — J343 Hypertrophy of nasal turbinates: Secondary | ICD-10-CM | POA: Diagnosis not present

## 2017-09-03 DIAGNOSIS — H9011 Conductive hearing loss, unilateral, right ear, with unrestricted hearing on the contralateral side: Secondary | ICD-10-CM | POA: Diagnosis not present

## 2017-09-21 ENCOUNTER — Ambulatory Visit (INDEPENDENT_AMBULATORY_CARE_PROVIDER_SITE_OTHER): Payer: BLUE CROSS/BLUE SHIELD | Admitting: Otolaryngology

## 2017-09-21 DIAGNOSIS — H9011 Conductive hearing loss, unilateral, right ear, with unrestricted hearing on the contralateral side: Secondary | ICD-10-CM | POA: Diagnosis not present

## 2017-09-21 DIAGNOSIS — H66011 Acute suppurative otitis media with spontaneous rupture of ear drum, right ear: Secondary | ICD-10-CM

## 2017-10-09 HISTORY — PX: MYRINGOTOMY: SUR874

## 2017-10-15 ENCOUNTER — Ambulatory Visit (INDEPENDENT_AMBULATORY_CARE_PROVIDER_SITE_OTHER): Payer: BLUE CROSS/BLUE SHIELD | Admitting: Otolaryngology

## 2017-11-16 ENCOUNTER — Ambulatory Visit (INDEPENDENT_AMBULATORY_CARE_PROVIDER_SITE_OTHER): Payer: BLUE CROSS/BLUE SHIELD | Admitting: Otolaryngology

## 2017-11-16 DIAGNOSIS — H7201 Central perforation of tympanic membrane, right ear: Secondary | ICD-10-CM | POA: Diagnosis not present

## 2017-11-16 DIAGNOSIS — H6983 Other specified disorders of Eustachian tube, bilateral: Secondary | ICD-10-CM | POA: Diagnosis not present

## 2017-11-16 DIAGNOSIS — H903 Sensorineural hearing loss, bilateral: Secondary | ICD-10-CM | POA: Diagnosis not present

## 2018-01-18 ENCOUNTER — Other Ambulatory Visit (HOSPITAL_COMMUNITY): Payer: Self-pay | Admitting: Neurological Surgery

## 2018-01-18 DIAGNOSIS — M48062 Spinal stenosis, lumbar region with neurogenic claudication: Secondary | ICD-10-CM

## 2018-01-20 ENCOUNTER — Encounter (HOSPITAL_COMMUNITY): Payer: Self-pay

## 2018-01-20 ENCOUNTER — Ambulatory Visit (HOSPITAL_COMMUNITY)
Admission: RE | Admit: 2018-01-20 | Discharge: 2018-01-20 | Disposition: A | Discharge status: Home / Self Care | Payer: BLUE CROSS/BLUE SHIELD | Source: Ambulatory Visit | Attending: Neurological Surgery | Admitting: Neurological Surgery

## 2018-01-20 DIAGNOSIS — M48062 Spinal stenosis, lumbar region with neurogenic claudication: Secondary | ICD-10-CM | POA: Diagnosis present

## 2018-01-20 DIAGNOSIS — M4316 Spondylolisthesis, lumbar region: Secondary | ICD-10-CM | POA: Diagnosis not present

## 2018-01-20 DIAGNOSIS — M47816 Spondylosis without myelopathy or radiculopathy, lumbar region: Secondary | ICD-10-CM | POA: Insufficient documentation

## 2018-02-16 ENCOUNTER — Other Ambulatory Visit: Payer: Self-pay | Admitting: Neurological Surgery

## 2018-03-02 ENCOUNTER — Other Ambulatory Visit: Payer: Self-pay | Admitting: Neurological Surgery

## 2018-03-02 DIAGNOSIS — M48062 Spinal stenosis, lumbar region with neurogenic claudication: Secondary | ICD-10-CM

## 2018-03-12 ENCOUNTER — Ambulatory Visit
Admission: RE | Admit: 2018-03-12 | Discharge: 2018-03-12 | Disposition: A | Discharge status: Home / Self Care | Payer: BLUE CROSS/BLUE SHIELD | Source: Ambulatory Visit | Attending: Neurological Surgery | Admitting: Neurological Surgery

## 2018-03-12 DIAGNOSIS — M48062 Spinal stenosis, lumbar region with neurogenic claudication: Secondary | ICD-10-CM

## 2018-03-19 ENCOUNTER — Ambulatory Visit: Admission: RE | Admit: 2018-03-19 | Payer: Medicare Other | Source: Ambulatory Visit

## 2018-05-17 ENCOUNTER — Other Ambulatory Visit: Payer: Self-pay | Admitting: Neurological Surgery

## 2018-05-17 ENCOUNTER — Other Ambulatory Visit (HOSPITAL_COMMUNITY): Payer: Self-pay | Admitting: Neurological Surgery

## 2018-05-17 DIAGNOSIS — M48062 Spinal stenosis, lumbar region with neurogenic claudication: Secondary | ICD-10-CM

## 2018-05-19 MED ORDER — SODIUM CHLORIDE 0.9 % IV SOLN: 4.0000 mg | Freq: Four times a day (QID) | INTRAVENOUS | Status: AC | PRN

## 2018-05-27 ENCOUNTER — Ambulatory Visit (HOSPITAL_COMMUNITY)
Admission: RE | Admit: 2018-05-27 | Discharge: 2018-05-27 | Disposition: A | Discharge status: Home / Self Care | Payer: BLUE CROSS/BLUE SHIELD | Source: Ambulatory Visit | Attending: Neurological Surgery | Admitting: Neurological Surgery

## 2018-05-27 ENCOUNTER — Other Ambulatory Visit: Payer: Self-pay

## 2018-05-27 DIAGNOSIS — M48062 Spinal stenosis, lumbar region with neurogenic claudication: Secondary | ICD-10-CM | POA: Insufficient documentation

## 2018-05-27 DIAGNOSIS — M5136 Other intervertebral disc degeneration, lumbar region: Secondary | ICD-10-CM | POA: Diagnosis not present

## 2018-05-27 MED ORDER — DEXAMETHASONE 4 MG PO TABS: 4.0000 mg | ORAL_TABLET | Freq: Once | ORAL | Status: DC

## 2018-05-27 MED ORDER — IOPAMIDOL (ISOVUE-M 200) INJECTION 41%
20.0000 mL | Freq: Once | INTRAMUSCULAR | Status: AC
  Administered 2018-05-27: 16 mL via INTRATHECAL

## 2018-05-27 MED ORDER — IOPAMIDOL (ISOVUE-M 200) INJECTION 41%
INTRAMUSCULAR | Status: AC
  Filled 2018-05-27: qty 10

## 2018-05-27 MED ORDER — ONDANSETRON HCL 4 MG/2ML IJ SOLN: 4.0000 mg | Freq: Four times a day (QID) | INTRAMUSCULAR | Status: DC | PRN

## 2018-05-27 MED ORDER — OXYCODONE-ACETAMINOPHEN 5-325 MG PO TABS
1.0000 | ORAL_TABLET | ORAL | Status: DC | PRN
  Administered 2018-05-27: 1 via ORAL

## 2018-05-27 MED ORDER — OXYCODONE-ACETAMINOPHEN 5-325 MG PO TABS
ORAL_TABLET | ORAL | Status: AC
  Filled 2018-05-27: qty 1

## 2018-05-27 MED ORDER — LIDOCAINE HCL (PF) 1 % IJ SOLN
INTRAMUSCULAR | Status: AC
  Administered 2018-05-27: 5 mL via INTRADERMAL
  Filled 2018-05-27: qty 10

## 2018-05-27 MED ORDER — LIDOCAINE HCL (PF) 1 % IJ SOLN
5.0000 mL | Freq: Once | INTRAMUSCULAR | Status: AC
  Administered 2018-05-27: 5 mL via INTRADERMAL

## 2018-05-27 MED ORDER — DIAZEPAM 5 MG PO TABS
10.0000 mg | ORAL_TABLET | Freq: Once | ORAL | Status: AC
  Administered 2018-05-27: 10 mg via ORAL
  Filled 2018-05-27: qty 2

## 2018-05-27 NOTE — Procedures (Signed)
Marcus Hutchinson is a 57 year old individual with high BMI and significant claustrophobia who could not tolerate an MRI.  He complains of significant back pain and leg pain.  It is suspected that he has some spinal stenosis and I had recommended a myelogram.  Efforts at obtaining an MRI were unsuccessful despite significant medication.  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

## 2018-05-27 NOTE — Discharge Instructions (Signed)
Myelogram, Care After °These instructions give you information about caring for yourself after your procedure. Your doctor may also give you more specific instructions. Call your doctor if you have any problems or questions after your procedure. °Follow these instructions at home: °· Drink enough fluid to keep your pee (urine) clear or pale yellow. °· Rest as told by your doctor. °· Lie flat with your head slightly raised (elevated). °· Do not bend, lift, or do any hard activities for 24-48 hours or as told by your doctor. °· Take over-the-counter and prescription medicines only as told by your doctor. °· Take care of and remove your bandage (dressing) as told by your doctor. °· Bathe or shower as told by your doctor. °Contact a health care provider if: °· You have a fever. °· You have a headache that lasts longer than 24 hours. °· You feel sick to your stomach (nauseous). °· You throw up (vomit). °· Your neck is stiff. °· Your legs feel numb. °· You cannot pee. °· You cannot poop (have a bowel movement). °· You have a rash. °· You are itchy or sneezing. °Get help right away if: °· You have new symptoms or your symptoms get worse. °· You have a seizure. °· You have trouble breathing. °This information is not intended to replace advice given to you by your health care provider. Make sure you discuss any questions you have with your health care provider. °Document Released: 05/06/2008 Document Revised: 03/27/2016 Document Reviewed: 05/10/2015 °Elsevier Interactive Patient Education © 2018 [employer]. ° °

## 2018-06-03 ENCOUNTER — Other Ambulatory Visit: Payer: Self-pay | Admitting: Neurological Surgery

## 2018-06-14 ENCOUNTER — Ambulatory Visit (INDEPENDENT_AMBULATORY_CARE_PROVIDER_SITE_OTHER): Payer: BLUE CROSS/BLUE SHIELD | Admitting: Otolaryngology

## 2018-06-14 DIAGNOSIS — H7201 Central perforation of tympanic membrane, right ear: Secondary | ICD-10-CM | POA: Diagnosis not present

## 2018-06-14 DIAGNOSIS — H608X3 Other otitis externa, bilateral: Secondary | ICD-10-CM | POA: Diagnosis not present

## 2018-06-14 DIAGNOSIS — H6121 Impacted cerumen, right ear: Secondary | ICD-10-CM | POA: Diagnosis not present

## 2018-07-01 HISTORY — PX: MOLE REMOVAL: SHX2046

## 2018-07-01 NOTE — Pre-Procedure Instructions (Signed)
Marcus Hutchinson  07/01/2018      [facility] - [address], [place], [place] - [place] [address] Phone: [phone] Fax: [phone]    Your procedure is scheduled on Mon., Dec. 2, 2019 from 3:08PM-4:59PM  Report to [facility] Admitting Entrance "A" at 1:05PM  Call this number if you have problems the morning of surgery:  [phone]   Remember:  Do not eat or drink after midnight on Dec. 1st                     Take these medicines the morning of surgery with A SIP OF WATER: BuPROPion (WELLBUTRIN XL) and Metoprolol succinate (TOPROL-XL)  If needed: VISINE-AC eye drops  Follow your surgeon's instructions on when to stop Aspirin.  If no instructions were given by your surgeon then you will need to call the office to get those instructions.   7 days before surgery (07/05/18), stop taking all Other Aspirin Products, Vitamins, Fish oils, and Herbal medications. Also stop all NSAIDS i.e. Advil, Ibuprofen, Motrin, Aleve, Anaprox, Naproxen, BC, Goody Powders, and all Supplements.     Do not wear jewelry.  Do not wear lotions, powders, colognes, or deodorant.  Do not shave 48 hours prior to surgery.  Men may shave face.  Do not bring valuables to the hospital.  [facility] is not responsible for any belongings or valuables.  Contacts, dentures or bridgework may not be worn into surgery.  Leave your suitcase in the car.  After surgery it may be brought to your room.  For patients admitted to the hospital, discharge time will be determined by your treatment team.  Patients discharged the day of surgery will not be allowed to drive home.   Special instructions:  [facility]- Preparing For Surgery  Before surgery, you can play an important role. Because skin is not sterile, your skin needs to be as free of germs as possible. You can reduce the number of germs on your skin by washing with CHG (chlorahexidine gluconate) Soap before  surgery.  CHG is an antiseptic cleaner which kills germs and bonds with the skin to continue killing germs even after washing.    Oral Hygiene is also important to reduce your risk of infection.  Remember - BRUSH YOUR TEETH THE MORNING OF SURGERY WITH YOUR REGULAR TOOTHPASTE  Please do not use if you have an allergy to CHG or antibacterial soaps. If your skin becomes reddened/irritated stop using the CHG.  Do not shave (including legs and underarms) for at least 48 hours prior to first CHG shower. It is OK to shave your face.  Please follow these instructions carefully.   1. Shower the NIGHT BEFORE SURGERY and the MORNING OF SURGERY with CHG.   2. If you chose to wash your hair, wash your hair first as usual with your normal shampoo.  3. After you shampoo, rinse your hair and body thoroughly to remove the shampoo.  4. Use CHG as you would any other liquid soap. You can apply CHG directly to the skin and wash gently with a scrungie or a clean washcloth.   5. Apply the CHG Soap to your body ONLY FROM THE NECK DOWN.  Do not use on open wounds or open sores. Avoid contact with your eyes, ears, mouth and genitals (private parts). Wash Face and genitals (private parts)  with your normal soap.  6. Wash thoroughly, paying special  attention to the area where your surgery will be performed.  7. Thoroughly rinse your body with warm water from the neck down.  8. DO NOT shower/wash with your normal soap after using and rinsing off the CHG Soap.  9. Pat yourself dry with a CLEAN TOWEL.  10. Wear CLEAN PAJAMAS to bed the night before surgery, wear comfortable clothes the morning of surgery  11. Place CLEAN SHEETS on your bed the night of your first shower and DO NOT SLEEP WITH PETS.  Day of Surgery:  Do not apply any deodorants/lotions.  Please wear clean clothes to the hospital/surgery center.   Remember to brush your teeth WITH YOUR REGULAR TOOTHPASTE.  Please read over the following fact  sheets that you were given. Pain Booklet, Coughing and Deep Breathing, MRSA Information and Surgical Site Infection Prevention

## 2018-07-02 ENCOUNTER — Encounter (HOSPITAL_COMMUNITY)
Admission: RE | Admit: 2018-07-02 | Discharge: 2018-07-02 | Disposition: A | Discharge status: Home / Self Care | Payer: BLUE CROSS/BLUE SHIELD | Source: Ambulatory Visit | Attending: Neurological Surgery | Admitting: Neurological Surgery

## 2018-07-02 ENCOUNTER — Encounter (HOSPITAL_COMMUNITY): Payer: Self-pay

## 2018-07-02 ENCOUNTER — Other Ambulatory Visit: Payer: Self-pay

## 2018-07-02 DIAGNOSIS — Z01818 Encounter for other preprocedural examination: Secondary | ICD-10-CM | POA: Insufficient documentation

## 2018-07-02 HISTORY — DX: Depression, unspecified: F32.A

## 2018-07-02 HISTORY — DX: Major depressive disorder, single episode, unspecified: F32.9

## 2018-07-02 HISTORY — DX: Anxiety disorder, unspecified: F41.9

## 2018-07-02 LAB — SURGICAL PCR SCREEN
MRSA, PCR: NEGATIVE
STAPHYLOCOCCUS AUREUS: NEGATIVE

## 2018-07-02 NOTE — Progress Notes (Signed)
PCP: Dr. Woody Seller @ [facility]  Cardiologist:  Dr. Harl Bowie : was seen after hip surgery for shortness of breath. Has not been back since then. Denies symptoms.  Hasn't been instructed when to stop aspirin. Stated to call Dr. Wallene Huh' office for clarification.  Pt. Stated has blood work  Earlier this week, requested last from Dr. Woody Seller' office  Last sleep study >5 yrs.

## 2018-07-05 NOTE — Progress Notes (Signed)
Anesthesia Chart Review:  Case:  [id number] Date/Time:  07/12/18 1040   Procedure:  Bilateral Lumbar 4-5 Laminotomy/Foraminotomy (Bilateral Back) - Bilateral Lumbar 4-5 Laminotomy/Foraminotomy   Anesthesia type:  General   Pre-op diagnosis:  Stenosis of lateral recess of lumbar spine   Location:  [facility] OR ROOM 21 / [facility] [facility]   Surgeon:  Kristeen Miss, MD      DISCUSSION: 57 yo male former smoker. Pertinent hx includes OSA on CPAP, Anxiety, Depression, HTN, Hypothyroid (diagosis per PCP lab order, TSH 5.67 on labs 06/28/18, however pt does not appear to be on any medication for this), Morbid obesity BMI 60, S/p Left THA 2017.  After pt had THA in 2017 he was seen by Dr. Harl Bowie for eval of DOE. Per Dr. Nelly Laurence note from 04/01/2016:  "No other abnormalities note on recent echo. Symptoms not overally consistent with ischemia, he has had negative cath in 2004 and negative exercise stress in 2013. Noninvasive ischemic testing for him is of low yield given his body habitus, there is not objective evidedence at this time to support invasive testing. Symptoms may be due to significant obesity and deconditioning. I have encouraged him to make efforts for weight loss and increased exercise. He will f/u in 2 months and reevaluate symptoms, at that time may consider RHC/LHC depending on symptoms. We will order PFTs, risk for obesity hypoventilation syndrome."  PFT 04/09/2018 was mild-mod abnormal and he was referred to Dr. Luan Pulling.  Pt subsequently underwent CPX testing 05/09/2016 ordered by Dr. Luan Pulling. Results narrative by Dr. Haroldine Laws:  "No evidence of cardiac limitation. Patient's underlying physical fitness is reasonable but this is masked by his severe obesity. At peak exercise he is limited by his obesity and related restrictive physiology."  Per PAT nursing note the pt currently denies any cardiopulmonary symptoms. Says he has not been seen by cardiology since last visit with Dr. Harl Bowie.  Anticipate he can  proceed as planned barring acute status change.  VS: BP 124/88   Pulse 65   Temp 36.9 C   Resp 20   Ht 6\' 1"  (1.854 m)   Wt (!) 206.9 kg   SpO2 96%   BMI 60.18 kg/m   PROVIDERS: Glenda Chroman, MD is PCP   LABS: Labs performed at PCP office 06/28/2018 reviewed (copy on pt chart). TSH elevated at 5.67, pt with hypothyroid per lab order. CBC WNL. CMP shows mildly elevated creatinine at 1.36, otherwise WNL. Review of previous labs in Epic shows similar creatinine values.    Labs Reviewed  SURGICAL PCR SCREEN    IMAGES: CTA Chest 04/23/2016: IMPRESSION: 1. Suboptimal exam due to artifacts from patient's large body habitus. No central pulmonary embolus is noted. 2. No mediastinal hematoma or adenopathy. 3. Degenerative changes thoracic spine. 4. Fatty infiltration of the liver. 5. No infiltrate or pulmonary edema. Mild bilateral lower lobe posterior atelectasis.    EKG: 07/02/2018: NSR. Rate 62.  CV: CPX testing 05/09/2016: Conclusion: Exercise testing with gas exchange demonstrates mild functional impairment when compared to matches sedentary norms. There is no indication of cardiac limitation. With adjustment to patient's ideal weight and nearly achieved ventilatory limits, patient appears primarily limited due to his body habitus and restrictive lung physiology. Pre-exercise spirometry suggests mild restrictive physiology. Note there was mild chronotropic incompetence.  Agree with above. No evidence of cardiac limitation. Patient's underlying physical fitness is reasonable but this is masked by his severe obesity. At peak exercise he is limited by his obesity and related restrictive  physiology.   Bensimhon, Daniel,MD  TTE 01/28/2016: Technically limited, all structures not well visualized. The LV diastolic function is reduced. Otherwise grossly normal echo.  Past Medical History:  Diagnosis Date  . Anxiety   . Arthritis    osteoarthritis, spinal stenosis  . Depression    . History of kidney stones    x 3 episodes- none in awhile.  [name] Hypertension   . Sleep apnea    cpap use pressure 17    Past Surgical History:  Procedure Laterality Date  . CARDIAC CATHETERIZATION     10 yrs ago-normal  . CHOLECYSTECTOMY     laparoscopic  . FINGER SURGERY Right    little finger tip reattachment(slight decrease sensation).  . HERNIA REPAIR Left    LIH(left inguinal Hernia) repair  . JOINT REPLACEMENT Right    RTHA- '11  . MOLE REMOVAL Left 07/01/2018   shoulder  . MYRINGOTOMY Right 10/2017  . SHOULDER ARTHROSCOPY DISTAL CLAVICLE EXCISION AND OPEN ROTATOR CUFF REPAIR Right    '09  . TOTAL HIP ARTHROPLASTY Left 11/07/2015   Procedure: LEFT TOTAL HIP ARTHROPLASTY POSTERIOR  APPROACH;  Surgeon: Gaynelle Arabian, MD;  Location: [facility] ORS;  Service: Orthopedics;  Laterality: Left;    MEDICATIONS: . aspirin EC 81 MG tablet  . buPROPion (WELLBUTRIN XL) 150 MG 24 hr tablet  . metoprolol succinate (TOPROL-XL) 25 MG 24 hr tablet  . Multiple Vitamins-Minerals (MULTIVITAMIN WITH MINERALS) tablet  . naproxen sodium (ALEVE) 220 MG tablet  . tetrahydrozoline-zinc (VISINE-AC) 0.05-0.25 % ophthalmic solution   No current facility-administered medications for this encounter.    . ondansetron (ZOFRAN) 4 mg in sodium chloride 0.9 % 50 mL IVPB    Wynonia Musty [facility] Short Stay Center/Anesthesiology Phone [phone] 07/06/2018 2:46 PM

## 2018-07-06 NOTE — Anesthesia Preprocedure Evaluation (Addendum)
Anesthesia Evaluation  Patient identified by MRN, date of birth, ID band Patient awake    Reviewed: Allergy & Precautions, NPO status , Patient's Chart, lab work & pertinent test results  History of Anesthesia Complications Negative for: history of anesthetic complications  Airway Mallampati: III  TM Distance: >3 FB Neck ROM: Full    Dental  (+) Teeth Intact   Pulmonary sleep apnea , former smoker,    breath sounds clear to auscultation       Cardiovascular hypertension,  Rhythm:Regular     Neuro/Psych PSYCHIATRIC DISORDERS Anxiety Depression  Neuromuscular disease    GI/Hepatic negative GI ROS, Neg liver ROS,   Endo/Other  Hypothyroidism Morbid obesity  Renal/GU Renal InsufficiencyRenal disease     Musculoskeletal  (+) Arthritis ,   Abdominal   Peds  Hematology negative hematology ROS (+)   Anesthesia Other Findings DISCUSSION: 57 yo male former smoker. Pertinent hx includes OSA on CPAP, Anxiety, Depression, HTN, Hypothyroid (diagosis per PCP lab order, TSH 5.67 on labs 06/28/18, however pt does not appear to be on any medication for this), Morbid obesity BMI 60, S/p Left THA 2017.  After pt had THA in 2017 he was seen by Dr. Harl Bowie for eval of DOE. Per Dr. Nelly Laurence note from 04/01/2016:  "No other abnormalities note on recent echo. Symptoms not overally consistent with ischemia, he has had negative cath in 2004 and negative exercise stress in 2013. Noninvasive ischemic testing for him is of low yield given his body habitus, there is not objective evidedence at this time to support invasive testing. Symptoms may be due to significant obesity and deconditioning. I have encouraged him to make efforts for weight loss and increased exercise. He will f/u in 2 months and reevaluate symptoms, at that time may consider RHC/LHC depending on symptoms. We will order PFTs, risk for obesity hypoventilation syndrome."  PFT 04/09/2018  was mild-mod abnormal and he was referred to Dr. Luan Pulling.  Pt subsequently underwent CPX testing 05/09/2016 ordered by Dr. Luan Pulling. Results narrative by Dr. Haroldine Laws:  "No evidence of cardiac limitation. Patient's underlying physical fitness is reasonable but this is masked by his severe obesity. At peak exercise he is limited by his obesity and related restrictive physiology."  Per PAT nursing note the pt currently denies any cardiopulmonary symptoms. Says he has not been seen by cardiology since last visit with Dr. Harl Bowie.  Anticipate he can proceed as planned barring acute status change.  Reproductive/Obstetrics                            Anesthesia Physical Anesthesia Plan  ASA: III  Anesthesia Plan: General   Post-op Pain Management:    Induction: Intravenous  PONV Risk Score and Plan: 2 and Ondansetron and Dexamethasone  Airway Management Planned: Oral ETT  Additional Equipment: None  Intra-op Plan:   Post-operative Plan: Extubation in OR  Informed Consent: I have reviewed the patients History and Physical, chart, labs and discussed the procedure including the risks, benefits and alternatives for the proposed anesthesia with the patient or authorized representative who has indicated his/her understanding and acceptance.   Dental advisory given  Plan Discussed with: CRNA and Surgeon  Anesthesia Plan Comments: (See PAT note 07/02/2018 by Karoline Caldwell, PA-C )       Anesthesia Quick Evaluation

## 2018-07-09 MED ORDER — DEXTROSE 5 % IV SOLN
3.0000 g | INTRAVENOUS | Status: AC
  Administered 2018-07-12: 3 g via INTRAVENOUS
  Filled 2018-07-09: qty 3

## 2018-07-12 ENCOUNTER — Ambulatory Visit (HOSPITAL_COMMUNITY): Payer: BLUE CROSS/BLUE SHIELD

## 2018-07-12 ENCOUNTER — Ambulatory Visit (HOSPITAL_COMMUNITY): Payer: BLUE CROSS/BLUE SHIELD | Admitting: Anesthesiology

## 2018-07-12 ENCOUNTER — Other Ambulatory Visit: Payer: Self-pay

## 2018-07-12 ENCOUNTER — Encounter (HOSPITAL_COMMUNITY): Payer: Self-pay | Admitting: Surgery

## 2018-07-12 ENCOUNTER — Encounter (HOSPITAL_COMMUNITY)
Admission: RE | Disposition: A | Discharge status: Home / Self Care | Payer: Self-pay | Source: Ambulatory Visit | Attending: Neurological Surgery

## 2018-07-12 ENCOUNTER — Ambulatory Visit (HOSPITAL_COMMUNITY): Payer: BLUE CROSS/BLUE SHIELD | Admitting: Physician Assistant

## 2018-07-12 ENCOUNTER — Observation Stay (HOSPITAL_COMMUNITY)
Admission: RE | Admit: 2018-07-12 | Discharge: 2018-07-13 | Disposition: A | Discharge status: Home / Self Care | Payer: BLUE CROSS/BLUE SHIELD | Source: Ambulatory Visit | Attending: Neurological Surgery | Admitting: Neurological Surgery

## 2018-07-12 DIAGNOSIS — Z87891 Personal history of nicotine dependence: Secondary | ICD-10-CM | POA: Diagnosis not present

## 2018-07-12 DIAGNOSIS — M4726 Other spondylosis with radiculopathy, lumbar region: Secondary | ICD-10-CM | POA: Diagnosis not present

## 2018-07-12 DIAGNOSIS — Z7982 Long term (current) use of aspirin: Secondary | ICD-10-CM | POA: Insufficient documentation

## 2018-07-12 DIAGNOSIS — Z6841 Body Mass Index (BMI) 40.0 and over, adult: Secondary | ICD-10-CM | POA: Insufficient documentation

## 2018-07-12 DIAGNOSIS — I10 Essential (primary) hypertension: Secondary | ICD-10-CM | POA: Diagnosis not present

## 2018-07-12 DIAGNOSIS — Z79899 Other long term (current) drug therapy: Secondary | ICD-10-CM | POA: Insufficient documentation

## 2018-07-12 DIAGNOSIS — M48062 Spinal stenosis, lumbar region with neurogenic claudication: Secondary | ICD-10-CM | POA: Diagnosis present

## 2018-07-12 DIAGNOSIS — Z419 Encounter for procedure for purposes other than remedying health state, unspecified: Secondary | ICD-10-CM

## 2018-07-12 HISTORY — PX: LUMBAR LAMINECTOMY: SHX95

## 2018-07-12 HISTORY — PX: LUMBAR LAMINECTOMY/DECOMPRESSION MICRODISCECTOMY: SHX5026

## 2018-07-12 SURGERY — LUMBAR LAMINECTOMY/DECOMPRESSION MICRODISCECTOMY 1 LEVEL
Anesthesia: General | Site: Back | Laterality: Bilateral

## 2018-07-12 MED ORDER — FENTANYL CITRATE (PF) 250 MCG/5ML IJ SOLN
INTRAMUSCULAR | Status: AC
  Filled 2018-07-12: qty 5

## 2018-07-12 MED ORDER — ROCURONIUM BROMIDE 10 MG/ML (PF) SYRINGE
PREFILLED_SYRINGE | INTRAVENOUS | Status: DC | PRN
  Administered 2018-07-12: 30 mg via INTRAVENOUS
  Administered 2018-07-12: 50 mg via INTRAVENOUS

## 2018-07-12 MED ORDER — ACETAMINOPHEN 325 MG PO TABS: 650.0000 mg | ORAL_TABLET | ORAL | Status: DC | PRN

## 2018-07-12 MED ORDER — METHOCARBAMOL 1000 MG/10ML IJ SOLN
500.0000 mg | Freq: Four times a day (QID) | INTRAVENOUS | Status: DC | PRN
  Filled 2018-07-12 (×2): qty 5

## 2018-07-12 MED ORDER — SODIUM CHLORIDE 0.9 % IV SOLN
INTRAVENOUS | Status: DC | PRN
  Administered 2018-07-12: 11:00:00

## 2018-07-12 MED ORDER — POLYETHYLENE GLYCOL 3350 17 G PO PACK: 17.0000 g | PACK | Freq: Every day | ORAL | Status: DC | PRN

## 2018-07-12 MED ORDER — CEFAZOLIN SODIUM-DEXTROSE 2-4 GM/100ML-% IV SOLN
2.0000 g | Freq: Three times a day (TID) | INTRAVENOUS | Status: AC
  Administered 2018-07-12 – 2018-07-13 (×2): 2 g via INTRAVENOUS
  Filled 2018-07-12 (×2): qty 100

## 2018-07-12 MED ORDER — MIDAZOLAM HCL 5 MG/5ML IJ SOLN
INTRAMUSCULAR | Status: DC | PRN
  Administered 2018-07-12: 2 mg via INTRAVENOUS

## 2018-07-12 MED ORDER — THROMBIN 5000 UNITS EX SOLR
CUTANEOUS | Status: AC
  Filled 2018-07-12: qty 15000

## 2018-07-12 MED ORDER — CHLORHEXIDINE GLUCONATE CLOTH 2 % EX PADS: 6.0000 | MEDICATED_PAD | Freq: Once | CUTANEOUS | Status: DC

## 2018-07-12 MED ORDER — LACTATED RINGERS IV SOLN: INTRAVENOUS | Status: DC

## 2018-07-12 MED ORDER — LIDOCAINE 2% (20 MG/ML) 5 ML SYRINGE
INTRAMUSCULAR | Status: DC | PRN
  Administered 2018-07-12: 100 mg via INTRAVENOUS

## 2018-07-12 MED ORDER — SODIUM CHLORIDE 0.9% FLUSH: 3.0000 mL | Freq: Two times a day (BID) | INTRAVENOUS | Status: DC

## 2018-07-12 MED ORDER — NAPHAZOLINE-GLYCERIN 0.012-0.2 % OP SOLN: 1.0000 [drp] | Freq: Four times a day (QID) | OPHTHALMIC | Status: DC | PRN

## 2018-07-12 MED ORDER — ACETAMINOPHEN 10 MG/ML IV SOLN
INTRAVENOUS | Status: AC
  Filled 2018-07-12: qty 100

## 2018-07-12 MED ORDER — SODIUM CHLORIDE 0.9 % IV SOLN: INTRAVENOUS | Status: DC | PRN

## 2018-07-12 MED ORDER — BUPIVACAINE HCL (PF) 0.5 % IJ SOLN
INTRAMUSCULAR | Status: DC | PRN
  Administered 2018-07-12: 20 mL
  Administered 2018-07-12: 5 mL

## 2018-07-12 MED ORDER — SODIUM CHLORIDE 0.9 % IV SOLN: 250.0000 mL | INTRAVENOUS | Status: DC

## 2018-07-12 MED ORDER — ONDANSETRON HCL 4 MG/2ML IJ SOLN
4.0000 mg | Freq: Four times a day (QID) | INTRAMUSCULAR | Status: DC | PRN
  Administered 2018-07-12: 4 mg via INTRAVENOUS
  Filled 2018-07-12: qty 2

## 2018-07-12 MED ORDER — SODIUM CHLORIDE 0.9% FLUSH: 3.0000 mL | INTRAVENOUS | Status: DC | PRN

## 2018-07-12 MED ORDER — ONDANSETRON HCL 4 MG PO TABS: 4.0000 mg | ORAL_TABLET | Freq: Four times a day (QID) | ORAL | Status: DC | PRN

## 2018-07-12 MED ORDER — ACETAMINOPHEN 160 MG/5ML PO SOLN: 1000.0000 mg | Freq: Once | ORAL | Status: DC | PRN

## 2018-07-12 MED ORDER — LIDOCAINE-EPINEPHRINE 1 %-1:100000 IJ SOLN
INTRAMUSCULAR | Status: DC | PRN
  Administered 2018-07-12: 5 mL

## 2018-07-12 MED ORDER — SUCCINYLCHOLINE CHLORIDE 20 MG/ML IJ SOLN
INTRAMUSCULAR | Status: DC | PRN
  Administered 2018-07-12: 100 mg via INTRAVENOUS

## 2018-07-12 MED ORDER — FENTANYL CITRATE (PF) 100 MCG/2ML IJ SOLN
INTRAMUSCULAR | Status: AC
  Administered 2018-07-12: 50 ug via INTRAVENOUS
  Filled 2018-07-12: qty 2

## 2018-07-12 MED ORDER — OXYCODONE-ACETAMINOPHEN 5-325 MG PO TABS
1.0000 | ORAL_TABLET | ORAL | Status: DC | PRN
  Administered 2018-07-12 (×2): 2 via ORAL
  Filled 2018-07-12: qty 2

## 2018-07-12 MED ORDER — BUPROPION HCL ER (XL) 150 MG PO TB24
150.0000 mg | ORAL_TABLET | Freq: Two times a day (BID) | ORAL | Status: DC
  Administered 2018-07-13: 150 mg via ORAL
  Filled 2018-07-12: qty 1

## 2018-07-12 MED ORDER — BUPIVACAINE HCL (PF) 0.5 % IJ SOLN
INTRAMUSCULAR | Status: AC
  Filled 2018-07-12: qty 30

## 2018-07-12 MED ORDER — SODIUM CHLORIDE 0.9 % IV SOLN
INTRAVENOUS | Status: DC | PRN
  Administered 2018-07-12: 25 ug/min via INTRAVENOUS

## 2018-07-12 MED ORDER — PHENYLEPHRINE 40 MCG/ML (10ML) SYRINGE FOR IV PUSH (FOR BLOOD PRESSURE SUPPORT)
PREFILLED_SYRINGE | INTRAVENOUS | Status: DC | PRN
  Administered 2018-07-12: 80 ug via INTRAVENOUS
  Administered 2018-07-12: 40 ug via INTRAVENOUS
  Administered 2018-07-12: 80 ug via INTRAVENOUS
  Administered 2018-07-12: 40 ug via INTRAVENOUS

## 2018-07-12 MED ORDER — BISACODYL 10 MG RE SUPP: 10.0000 mg | Freq: Every day | RECTAL | Status: DC | PRN

## 2018-07-12 MED ORDER — LACTATED RINGERS IV SOLN
INTRAVENOUS | Status: DC
  Administered 2018-07-12 (×2): via INTRAVENOUS

## 2018-07-12 MED ORDER — THROMBIN 5000 UNITS EX SOLR
OROMUCOSAL | Status: DC | PRN
  Administered 2018-07-12: 11:00:00 via TOPICAL

## 2018-07-12 MED ORDER — PROMETHAZINE HCL 25 MG/ML IJ SOLN
12.5000 mg | Freq: Four times a day (QID) | INTRAMUSCULAR | Status: DC | PRN
  Administered 2018-07-12: 12.5 mg via INTRAVENOUS
  Filled 2018-07-12: qty 1

## 2018-07-12 MED ORDER — KETOROLAC TROMETHAMINE 15 MG/ML IJ SOLN
15.0000 mg | Freq: Four times a day (QID) | INTRAMUSCULAR | Status: DC
  Administered 2018-07-12 – 2018-07-13 (×2): 15 mg via INTRAVENOUS
  Filled 2018-07-12 (×3): qty 1

## 2018-07-12 MED ORDER — DOCUSATE SODIUM 100 MG PO CAPS
100.0000 mg | ORAL_CAPSULE | Freq: Two times a day (BID) | ORAL | Status: DC
  Administered 2018-07-13: 100 mg via ORAL
  Filled 2018-07-12: qty 1

## 2018-07-12 MED ORDER — METHOCARBAMOL 500 MG PO TABS
500.0000 mg | ORAL_TABLET | Freq: Four times a day (QID) | ORAL | Status: DC | PRN
  Administered 2018-07-12: 500 mg via ORAL

## 2018-07-12 MED ORDER — METHOCARBAMOL 500 MG PO TABS
ORAL_TABLET | ORAL | Status: AC
  Filled 2018-07-12: qty 1

## 2018-07-12 MED ORDER — FLEET ENEMA 7-19 GM/118ML RE ENEM: 1.0000 | ENEMA | Freq: Once | RECTAL | Status: DC | PRN

## 2018-07-12 MED ORDER — 0.9 % SODIUM CHLORIDE (POUR BTL) OPTIME
TOPICAL | Status: DC | PRN
  Administered 2018-07-12: 1000 mL

## 2018-07-12 MED ORDER — PHENOL 1.4 % MT LIQD: 1.0000 | OROMUCOSAL | Status: DC | PRN

## 2018-07-12 MED ORDER — SENNA 8.6 MG PO TABS
1.0000 | ORAL_TABLET | Freq: Two times a day (BID) | ORAL | Status: DC
  Filled 2018-07-12: qty 1

## 2018-07-12 MED ORDER — OXYCODONE HCL 5 MG PO TABS: 5.0000 mg | ORAL_TABLET | Freq: Once | ORAL | Status: DC | PRN

## 2018-07-12 MED ORDER — MORPHINE SULFATE (PF) 2 MG/ML IV SOLN: 2.0000 mg | INTRAVENOUS | Status: DC | PRN

## 2018-07-12 MED ORDER — DEXAMETHASONE SODIUM PHOSPHATE 10 MG/ML IJ SOLN
INTRAMUSCULAR | Status: DC | PRN
  Administered 2018-07-12: 10 mg via INTRAVENOUS

## 2018-07-12 MED ORDER — SUGAMMADEX SODIUM 200 MG/2ML IV SOLN
INTRAVENOUS | Status: DC | PRN
  Administered 2018-07-12: 100 mg via INTRAVENOUS
  Administered 2018-07-12: 400 mg via INTRAVENOUS

## 2018-07-12 MED ORDER — OXYCODONE-ACETAMINOPHEN 5-325 MG PO TABS
ORAL_TABLET | ORAL | Status: AC
  Filled 2018-07-12: qty 2

## 2018-07-12 MED ORDER — EPHEDRINE SULFATE 50 MG/ML IJ SOLN
INTRAMUSCULAR | Status: DC | PRN
  Administered 2018-07-12: 15 mg via INTRAVENOUS
  Administered 2018-07-12: 5 mg via INTRAVENOUS
  Administered 2018-07-12: 20 mg via INTRAVENOUS
  Administered 2018-07-12: 10 mg via INTRAVENOUS

## 2018-07-12 MED ORDER — OXYCODONE HCL 5 MG/5ML PO SOLN: 5.0000 mg | Freq: Once | ORAL | Status: DC | PRN

## 2018-07-12 MED ORDER — FENTANYL CITRATE (PF) 100 MCG/2ML IJ SOLN
25.0000 ug | INTRAMUSCULAR | Status: DC | PRN
  Administered 2018-07-12 (×4): 50 ug via INTRAVENOUS

## 2018-07-12 MED ORDER — METOPROLOL SUCCINATE ER 25 MG PO TB24
25.0000 mg | ORAL_TABLET | Freq: Every day | ORAL | Status: DC
  Administered 2018-07-13: 25 mg via ORAL
  Filled 2018-07-12: qty 1

## 2018-07-12 MED ORDER — ACETAMINOPHEN 500 MG PO TABS: 1000.0000 mg | ORAL_TABLET | Freq: Once | ORAL | Status: DC | PRN

## 2018-07-12 MED ORDER — PROPOFOL 10 MG/ML IV BOLUS
INTRAVENOUS | Status: DC | PRN
  Administered 2018-07-12: 160 mg via INTRAVENOUS

## 2018-07-12 MED ORDER — FENTANYL CITRATE (PF) 250 MCG/5ML IJ SOLN
INTRAMUSCULAR | Status: DC | PRN
  Administered 2018-07-12: 100 ug via INTRAVENOUS
  Administered 2018-07-12 (×2): 25 ug via INTRAVENOUS
  Administered 2018-07-12: 100 ug via INTRAVENOUS

## 2018-07-12 MED ORDER — LIDOCAINE-EPINEPHRINE 1 %-1:100000 IJ SOLN
INTRAMUSCULAR | Status: AC
  Filled 2018-07-12: qty 1

## 2018-07-12 MED ORDER — FENTANYL CITRATE (PF) 100 MCG/2ML IJ SOLN
INTRAMUSCULAR | Status: AC
  Filled 2018-07-12: qty 2

## 2018-07-12 MED ORDER — ACETAMINOPHEN 10 MG/ML IV SOLN
1000.0000 mg | Freq: Once | INTRAVENOUS | Status: DC | PRN
  Administered 2018-07-12: 1000 mg via INTRAVENOUS

## 2018-07-12 MED ORDER — MIDAZOLAM HCL 2 MG/2ML IJ SOLN
INTRAMUSCULAR | Status: AC
  Filled 2018-07-12: qty 2

## 2018-07-12 MED ORDER — ACETAMINOPHEN 650 MG RE SUPP: 650.0000 mg | RECTAL | Status: DC | PRN

## 2018-07-12 MED ORDER — MENTHOL 3 MG MT LOZG: 1.0000 | LOZENGE | OROMUCOSAL | Status: DC | PRN

## 2018-07-12 SURGICAL SUPPLY — 54 items
ADH SKN CLS APL DERMABOND .7 (GAUZE/BANDAGES/DRESSINGS) ×1
BAG DECANTER FOR FLEXI CONT (MISCELLANEOUS) ×3 IMPLANT
BLADE CLIPPER SURG (BLADE) IMPLANT
BUR ACORN 6.0 (BURR) IMPLANT
BUR ACORN 6.0MM (BURR)
BUR MATCHSTICK NEURO 3.0 LAGG (BURR) ×3 IMPLANT
CANISTER SUCT 3000ML PPV (MISCELLANEOUS) ×3 IMPLANT
COVER WAND RF STERILE (DRAPES) ×1 IMPLANT
DECANTER SPIKE VIAL GLASS SM (MISCELLANEOUS) ×5 IMPLANT
DERMABOND ADVANCED (GAUZE/BANDAGES/DRESSINGS) ×2
DERMABOND ADVANCED .7 DNX12 (GAUZE/BANDAGES/DRESSINGS) ×1 IMPLANT
DEVICE DISSECT PLASMABLAD 3.0S (MISCELLANEOUS) ×1 IMPLANT
DRAPE HALF SHEET 40X57 (DRAPES) IMPLANT
DRAPE LAPAROTOMY T 102X78X121 (DRAPES) ×3 IMPLANT
DRAPE MICROSCOPE LEICA (MISCELLANEOUS) ×2 IMPLANT
DURAPREP 26ML APPLICATOR (WOUND CARE) ×3 IMPLANT
ELECT REM PT RETURN 9FT ADLT (ELECTROSURGICAL) ×3
ELECTRODE REM PT RTRN 9FT ADLT (ELECTROSURGICAL) ×1 IMPLANT
GAUZE 4X4 16PLY RFD (DISPOSABLE) IMPLANT
GAUZE SPONGE 4X4 12PLY STRL (GAUZE/BANDAGES/DRESSINGS) ×1 IMPLANT
GLOVE BIOGEL PI IND STRL 8.5 (GLOVE) ×1 IMPLANT
GLOVE BIOGEL PI INDICATOR 8.5 (GLOVE) ×2
GLOVE ECLIPSE 8.5 STRL (GLOVE) ×1 IMPLANT
GLOVE SS PI 9.0 STRL (GLOVE) ×2 IMPLANT
GLOVE SURG SS PI 7.0 STRL IVOR (GLOVE) ×6 IMPLANT
GLOVE SURG SS PI 7.5 STRL IVOR (GLOVE) ×4 IMPLANT
GLOVE SURG SS PI 8.5 STRL IVOR (GLOVE) ×4
GLOVE SURG SS PI 8.5 STRL STRW (GLOVE) IMPLANT
GOWN STRL REUS W/ TWL LRG LVL3 (GOWN DISPOSABLE) IMPLANT
GOWN STRL REUS W/ TWL XL LVL3 (GOWN DISPOSABLE) IMPLANT
GOWN STRL REUS W/TWL 2XL LVL3 (GOWN DISPOSABLE) ×5 IMPLANT
GOWN STRL REUS W/TWL LRG LVL3 (GOWN DISPOSABLE) ×3
GOWN STRL REUS W/TWL XL LVL3 (GOWN DISPOSABLE) ×3
HEMOSTAT POWDER KIT SURGIFOAM (HEMOSTASIS) ×2 IMPLANT
KIT BASIN OR (CUSTOM PROCEDURE TRAY) ×3 IMPLANT
KIT TURNOVER KIT B (KITS) ×3 IMPLANT
NDL SPNL 20GX3.5 QUINCKE YW (NEEDLE) IMPLANT
NEEDLE HYPO 22GX1.5 SAFETY (NEEDLE) ×3 IMPLANT
NEEDLE SPNL 20GX3.5 QUINCKE YW (NEEDLE) ×3 IMPLANT
NS IRRIG 1000ML POUR BTL (IV SOLUTION) ×3 IMPLANT
PACK LAMINECTOMY NEURO (CUSTOM PROCEDURE TRAY) ×3 IMPLANT
PAD ARMBOARD 7.5X6 YLW CONV (MISCELLANEOUS) ×13 IMPLANT
PATTIES SURGICAL .5 X1 (DISPOSABLE) ×1 IMPLANT
PLASMABLADE 3.0S (MISCELLANEOUS) ×3
RUBBERBAND STERILE (MISCELLANEOUS) ×4 IMPLANT
SPONGE SURGIFOAM ABS GEL SZ50 (HEMOSTASIS) ×1 IMPLANT
SUT VIC AB 1 CT1 18XBRD ANBCTR (SUTURE) ×1 IMPLANT
SUT VIC AB 1 CT1 8-18 (SUTURE) ×3
SUT VIC AB 2-0 CP2 18 (SUTURE) ×3 IMPLANT
SUT VIC AB 3-0 SH 8-18 (SUTURE) ×3 IMPLANT
SYR BULB 3OZ (MISCELLANEOUS) ×2 IMPLANT
TOWEL GREEN STERILE (TOWEL DISPOSABLE) ×5 IMPLANT
TOWEL GREEN STERILE FF (TOWEL DISPOSABLE) ×3 IMPLANT
WATER STERILE IRR 1000ML POUR (IV SOLUTION) ×3 IMPLANT

## 2018-07-12 NOTE — Progress Notes (Signed)
Patient ID: Marcus Hutchinson, male   DOB: [birth year], 57 y.o.   MRN: [id number] Vital signs are stable Motor function appears good in lower extremities Incisions clean and dry Stable postop

## 2018-07-12 NOTE — Transfer of Care (Signed)
Immediate Anesthesia Transfer of Care Note  Patient: Marcus Hutchinson  Procedure(s) Performed: Bilateral Lumbar four-five Laminotomy/Foraminotomy (Bilateral Back)  Patient Location: PACU  Anesthesia Type:General  Level of Consciousness: awake, alert  and oriented  Airway & Oxygen Therapy: Patient Spontanous Breathing and Patient connected to face mask oxygen  Post-op Assessment: Report given to RN, Post -op Vital signs reviewed and stable and Patient moving all extremities X 4  Post vital signs: Reviewed and stable  Last Vitals:  Vitals Value Taken Time  BP 145/85 07/12/2018 12:33 PM  Temp 36.5 C 07/12/2018 12:33 PM  Pulse 72 07/12/2018 12:36 PM  Resp 14 07/12/2018 12:36 PM  SpO2 96 % 07/12/2018 12:36 PM  Vitals shown include unvalidated device data.  Last Pain:  Vitals:   07/12/18 1233  TempSrc:   PainSc: Asleep      Patients Stated Pain Goal: 2 (56/21/30 8657)  Complications: No apparent anesthesia complications

## 2018-07-12 NOTE — Progress Notes (Signed)
RT placed pt on CPAP for the night. Pt utilizing home mask with dream station. Pt on home setting of 14cm H2O no oxygen bled in. RT will continue to monitor.

## 2018-07-12 NOTE — Anesthesia Procedure Notes (Signed)
Procedure Name: Intubation Date/Time: 07/12/2018 10:31 AM Performed by: Mariea Clonts, CRNA Pre-anesthesia Checklist: Patient identified, Emergency Drugs available, Suction available and Patient being monitored Patient Re-evaluated:Patient Re-evaluated prior to induction Oxygen Delivery Method: Circle System Utilized Preoxygenation: Pre-oxygenation with 100% oxygen Induction Type: IV induction Ventilation: Mask ventilation without difficulty Laryngoscope Size: Miller and 3 Grade View: Grade II Tube type: Oral Number of attempts: 1 Airway Equipment and Method: Stylet and Oral airway Placement Confirmation: ETT inserted through vocal cords under direct vision,  positive ETCO2 and breath sounds checked- equal and bilateral Tube secured with: Tape Dental Injury: Teeth and Oropharynx as per pre-operative assessment

## 2018-07-12 NOTE — Op Note (Signed)
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  A myelogram demonstrates significant central and subarticular stenosis at the level of L4-L5.  Patient has underlying morbid obesity and he is failed substantial efforts at conservative management ultimately advised simple decompression via bilateral laminotomies and foraminotomies and this is now being performed.  (BMI 60). Procedure: The patient was brought to the operating room supine on the stretcher.  After the smooth induction of general endotracheal anesthesia, he was turned prone.  Back was prepped with alcohol DuraPrep and draped in a sterile fashion.  Midline incision was created and carried down to the lumbodorsal fascia and needle localization of L4-5 was obtained after performing bilateral subperiosteal dissections along the L4 and L5 a second radiograph confirmed the L4-5 interspace.  Self-retaining shadow line retractor was placed into the wound.  Then after carefully dissecting the soft tissues away from the L4-5 interspace laminotomy was created removing the inferior margin lamina of L4 out to the medial wall of facet partial medial facetectomy was performed bilaterally then using microdissection technique we created significant laminotomies to expose the common dural tube out to the lateral recesses.  There is noted be significant redundant ligamentous material causing the subarticular stenosis and lateral recess stenosis in these areas this was then carefully taken down using a 2 mm  straight and curved Kerrison punch dissection was carried out meticulously until the large foraminotomy was created in the central canal was well decompressed this was done bilaterally with the help of the operating microscope.  Once a decompression was obtained on both sides area was copiously irrigated with antibiotic irrigating solution retractors were removed the area was inspected for hemostasis 20 cc of half percent Marcaine was injected into the paraspinous fascia.  Then the fascia was closed with #1 Vicryl in interrupted fashion and 2-0 Vicryl was used in the subcutaneous tissues 3-0 Vicryl was used to close subcuticular skin.  Dermabond was placed on the skin blood loss was estimated at approximately 75 cc.

## 2018-07-12 NOTE — Progress Notes (Signed)
RT offered to place pt on CPAP for the night. Pt wants to wait until later tonight to go on. Pts home setting is 14cmH2O with no O2 bled in. RT asked RN to call when he is ready to go on. RT will continue to monitor.

## 2018-07-12 NOTE — H&P (Signed)
Marcus Hutchinson is an 57 y.o. male.   Chief Complaint: Back and bilateral lower extremity pain HPI: Patient is a 57 year old individual who has morbid obesity and has been having increasing difficulty with his back and his legs feeling his strength getting less daily.  Complains of centralized back pain and a myelogram recently completed demonstrates that he has significant subarticular stenosis at L4-5 and L5-S1 with severe facet hypertrophy [name].  Past Medical History:  Diagnosis Date  . Anxiety   . Arthritis    osteoarthritis, spinal stenosis  . Depression   . History of kidney stones    x 3 episodes- none in awhile.  [name] Hypertension   . Sleep apnea    cpap use pressure 17    Past Surgical History:  Procedure Laterality Date  . CARDIAC CATHETERIZATION     10 yrs ago-normal  . CHOLECYSTECTOMY     laparoscopic  . FINGER SURGERY Right    little finger tip reattachment(slight decrease sensation).  . HERNIA REPAIR Left    LIH(left inguinal Hernia) repair  . JOINT REPLACEMENT Right    RTHA- '11  . MOLE REMOVAL Left 07/01/2018   shoulder  . MYRINGOTOMY Right 10/2017  . SHOULDER ARTHROSCOPY DISTAL CLAVICLE EXCISION AND OPEN ROTATOR CUFF REPAIR Right    '09  . TOTAL HIP ARTHROPLASTY Left 11/07/2015   Procedure: LEFT TOTAL HIP ARTHROPLASTY POSTERIOR  APPROACH;  Surgeon: Gaynelle Arabian, MD;  Location: [facility];  Service: Orthopedics;  Laterality: Left;    Family History  Problem Relation Age of Onset  . Arthritis Mother   . Diabetes  Mother   . Heart disease Father        stent placement  . Heart attack Father   . Heart failure Maternal Grandfather   . Stroke Maternal Uncle   . Heart attack Paternal Uncle    Social History:  reports that he quit smoking about 34 years ago. His smoking use included cigarettes. He started smoking about 39 years ago. He has a 3.50 pack-year smoking history. He has never used smokeless tobacco. He reports that he does not drink alcohol or use drugs.  Allergies:  Allergies  Allergen Reactions  . Latex Other (See Comments)    Blisters skin  . Tape Other (See Comments)    Blisters skin- paper tape seems to be ok **ADHESIVE**    Medications Prior to Admission  Medication Sig Dispense Refill  . aspirin EC 81 MG tablet Take 81 mg by mouth daily.    [name] buPROPion (WELLBUTRIN XL) 150 MG 24 hr tablet Take 150 mg by mouth 2 (two) times daily.  3  . metoprolol succinate (TOPROL-XL) 25 MG 24 hr tablet Take 25 mg by mouth daily.  3  . Multiple Vitamins-Minerals (MULTIVITAMIN WITH MINERALS) tablet Take 1 tablet by mouth daily.    . naproxen sodium (ALEVE) 220 MG tablet Take 440 mg by mouth daily.     [name] tetrahydrozoline-zinc (VISINE-AC) 0.05-0.25 % ophthalmic solution Place 2 drops into both eyes daily as needed (for  dry eyes).      No results found for this or any previous visit (from the past 48 hour(s)). No results found.  Review of Systems  Constitutional:       Morbid obesity  HENT: Negative.   Musculoskeletal: Positive for back pain.  Skin: Negative.   Neurological: Positive for sensory change, focal weakness and weakness.  Endo/Heme/Allergies: Negative.   Psychiatric/Behavioral: Negative.     Blood pressure 137/67, pulse 63, temperature 97.8 F (36.6 C), temperature source Oral, resp. rate 20, height 6\' 1"  (1.854 m), weight (!) 206.8 kg, SpO2 97 %. Physical Exam  Constitutional: He is oriented to person, place, and time. He appears well-developed and well-nourished.  Morbidly  obese  HENT:  Head: Normocephalic and atraumatic.  Eyes: Pupils are equal, round, and reactive to light. EOM are normal.  Neck: Normal range of motion. Neck supple.  Cardiovascular: Regular rhythm.  Respiratory: Effort normal and breath sounds normal.  Musculoskeletal:  Positive straight leg raising in either lower extremity at 15 degrees Patrick's maneuver is negative  Neurological: He is alert and oriented to person, place, and time.  Moderate weakness in the tibialis anterior bilaterally at 4 out of 5 gastroc strength appears intact Achilles reflexes are absent patellar reflexes are absent  Skin: Skin is warm and dry.  Psychiatric: He has a normal mood and affect. His behavior is normal. Judgment and thought content normal.     Assessment/Plan Spondylosis and stenosis L4-5 and L5-S1 bilateral laminotomies L4-5 to clear subarticular stenosis for the L5 nerve root.  Earleen Newport, MD 07/12/2018, 10:02 AM

## 2018-07-13 ENCOUNTER — Encounter (HOSPITAL_COMMUNITY): Payer: Self-pay | Admitting: Neurological Surgery

## 2018-07-13 DIAGNOSIS — M4726 Other spondylosis with radiculopathy, lumbar region: Secondary | ICD-10-CM | POA: Diagnosis not present

## 2018-07-13 MED ORDER — DIAZEPAM 5 MG PO TABS: 5.0000 mg | ORAL_TABLET | Freq: Four times a day (QID) | ORAL | 0 refills | Status: DC | PRN

## 2018-07-13 MED ORDER — OXYCODONE-ACETAMINOPHEN 5-325 MG PO TABS: 1.0000 | ORAL_TABLET | ORAL | 0 refills | Status: DC | PRN

## 2018-07-13 NOTE — Discharge Summary (Signed)
Physician Discharge Summary  Patient ID: Marcus Hutchinson MRN: [id number] DOB/AGE: 02-05-1961 57 y.o.  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  Tolerated the surgery well.  He is ambulatory he is voiding well.  Consults: None  Significant Diagnostic Studies: None  Treatments: Bilateral laminotomies and foraminotomies L4-L5  Discharge Exam: Blood pressure 134/80, pulse 72, temperature 98.1 F (36.7 C), temperature source Oral, resp. rate 20, height 6\' 1"  (1.854 m), weight (!) 206.8 kg, SpO2 93 %. Incision is clean and dry Station gait is intact  Disposition: Discharge disposition: 01-Home or Self Care       Discharge Instructions    Call MD for:  redness, tenderness, or signs of infection (pain, swelling, redness, odor or green/yellow discharge around incision site)   Complete by:  As directed    Call MD for:  severe uncontrolled pain   Complete by:  As directed    Call MD for:  temperature >100.4   Complete by:  As directed    Diet - low sodium heart healthy   Complete by:  As directed    Discharge instructions   Complete by:  As directed    Okay to shower. Do not apply salves or appointments to incision. No heavy lifting with the upper extremities greater than 15 pounds. May resume driving when not requiring pain medication and patient feels comfortable with doing so.   Incentive spirometry RT   Complete by:  As directed    Increase activity slowly   Complete by:  As directed      Allergies as of 07/13/2018      Reactions    Latex Other (See Comments)   Blisters skin   Tape Other (See Comments)   Blisters skin- paper tape seems to be ok **ADHESIVE**      Medication List    TAKE these medications   ALEVE 220 MG tablet Generic drug:  naproxen sodium Take 440 mg by mouth daily.   aspirin EC 81 MG tablet Take 81 mg by mouth daily.   buPROPion 150 MG 24 hr tablet Commonly known as:  WELLBUTRIN XL Take 150 mg by mouth 2 (two) times daily.   diazepam 5 MG tablet Commonly known as:  VALIUM Take 1 tablet (5 mg total) by mouth every 6 (six) hours as needed for muscle spasms.   metoprolol succinate 25 MG 24 hr tablet Commonly known as:  TOPROL-XL Take 25 mg by mouth daily.   multivitamin with minerals tablet Take 1 tablet by mouth daily.   oxyCODONE-acetaminophen 5-325 MG tablet Commonly known as:  PERCOCET/ROXICET Take 1-2 tablets by mouth every 3 (three) hours as needed for severe pain.   tetrahydrozoline-zinc 0.05-0.25 % ophthalmic solution Commonly known as:  VISINE-AC Place 2 drops into both eyes daily as needed (for dry eyes).        Signed: Earleen Newport 07/13/2018, 2:37 PM

## 2018-07-13 NOTE — Plan of Care (Signed)

## 2018-07-13 NOTE — Evaluation (Signed)
Occupational Therapy Evaluation Patient Details Name: Marcus Hutchinson MRN: [id number] DOB: September 05, 1960 Today's Date: 07/13/2018    History of Present Illness Patient is a 57 y/o male s/p  Bilateral laminotomies and foraminotomies L4-L5 with decompression of subarticular stenosis L4-L5 on 07/12/18. PMH significant for B THA, anxiety, HTN, OSA.    Clinical Impression   Pt admitted as above currently requiring Min guard assist for transfers/safety/back precautions during functional mobility/transfers and ADL's. He was assessed for acute OT followed by verbal instruction PO:EUMP precautions during ADL's and use of LH A/E for LB ADL's. Spouse assists PRN for LB ADL prior to this surgery and pt reports that he has all necessary DME from previous hip surgery. He currently denies any further acute OT needs at this time & plans to d/c later today with PRN spouse assist, will sign off.    Follow Up Recommendations  No OT follow up;Supervision - Intermittent    Equipment Recommendations  None recommended by OT(Pt reports that he has necessary DME and A/E)    Recommendations for Other Services       Precautions / Restrictions Precautions Precautions: Fall;Back Precaution Booklet Issued: Yes (comment) Precaution Comments: reviewed back precautions Restrictions Weight Bearing Restrictions: No      Mobility Bed Mobility Overal bed mobility: Modified Independent             General bed mobility comments: able to perform supine to sidelying and sidelying to sit with Mod I   Transfers Overall transfer level: Needs assistance Equipment used: Rolling walker (2 wheeled) Transfers: Sit to/from Stand Sit to Stand: Supervision         General transfer comment: for safety and immediate standing balance. no LOB or overt instability    Balance Overall balance assessment: Mild deficits observed, not formally tested                                         ADL either  performed or assessed with clinical judgement   ADL Overall ADL's : Needs assistance/impaired Eating/Feeding: Independent;Sitting   Grooming: Set up;Sitting;Standing;Min guard   Upper Body Bathing: Set up;Sitting   Lower Body Bathing: Min guard;Sit to/from stand;Adhering to back precautions;Sitting/lateral leans;With caregiver independent assisting;Cueing for back precautions Lower Body Bathing Details (indicate cue type and reason): Spouse Assists PRN prior to this surgery, has DME and LH A/E at home. Verbally reviewed use. Upper Body Dressing : Set up;Sitting   Lower Body Dressing: Min guard;Cueing for back precautions;Adhering to back precautions;With caregiver independent assisting;Sitting/lateral leans;Sit to/from stand Lower Body Dressing Details (indicate cue type and reason): Spouse Assists PRN prior to this surgery, has DME and LH A/E at home. Verbally reviewed use of A/E following back surgery - Min A don underwear and shorts. Toilet Transfer: Min guard;Ambulation;RW;BSC;Grab bars(Simulated EOB ambulating in hallway and transfer to recliner) Toilet Transfer Details (indicate cue type and reason): Pt reports that he has bariatric 3:1 at home Amesti and Hygiene: Min guard;Sit to/from stand;Adhering to back precautions;Cueing for back precautions       Functional mobility during ADLs: Min guard;Rolling walker General ADL Comments: Pt was assessed for Acute OT followed by verbal instruction NT:IRWE precautions during ADL's and use of LH A/E for LB ADL's. Spouse assists PRN for LB ADL prior to this surgery and pt reports that he has all necessary DME from previous hip surgeries. He currently denies any further  acute OT needs at this time & plans to d/c later today with PRN spouse assist, will sign off.     Vision Baseline Vision/History: Wears glasses Wears Glasses: At all times Patient Visual Report: No change from baseline Vision Assessment?: No apparent  visual deficits     Perception     Praxis      Pertinent Vitals/Pain Pain Assessment: 0-10 Pain Score: 4  Pain Location: low back Pain Descriptors / Indicators: Sore Pain Intervention(s): Limited activity within patient's tolerance;Monitored during session;Repositioned     Hand Dominance Right   Extremity/Trunk Assessment Upper Extremity Assessment Upper Extremity Assessment: Overall WFL for tasks assessed   Lower Extremity Assessment Lower Extremity Assessment: Defer to PT evaluation;Overall [facility] for tasks assessed   Cervical / Trunk Assessment Cervical / Trunk Assessment: Normal   Communication Communication Communication: No difficulties   Cognition Arousal/Alertness: Awake/alert Behavior During Therapy: WFL for tasks assessed/performed Overall Cognitive Status: Within Functional Limits for tasks assessed                                     General Comments  Wife present and supportive - will be taking off of work for 1-2 weeks to assist pt PRN. Wife was also assisting PRN for LB ADL's prior to this surgery "since his hip surgeries".    Exercises     Shoulder Instructions      Home Living Family/patient expects to be discharged to:: Private residence Living Arrangements: Spouse/significant other;Children   Type of Home: House Home Access: Ramped entrance(1 step to enter home)     Home Layout: Two level(downstairs basement - lives on main floor)     Bathroom Shower/Tub: Tub/shower unit         Home Equipment: Environmental consultant - 2 wheels;Cane - single point;Shower seat;Hand held Management consultant)          Prior Functioning/Environment Level of Independence: Needs assistance  Gait / Transfers Assistance Needed: use of RW for short distances ADL's / Homemaking Assistance Needed: assist for ADLs - bathing and dressing             OT Problem List:        OT Treatment/Interventions:      OT Goals(Current goals can be  found in the care plan section) Acute Rehab OT Goals Patient Stated Goal: Go home later today OT Goal Formulation: All assessment and education complete, DC therapy Time For Goal Achievement: 07/13/18 Potential to Achieve Goals: Good  OT Frequency:     Barriers to D/C:            Co-evaluation              AM-PAC OT "6 Clicks" Daily Activity     Outcome Measure Help from another person eating meals?: None Help from another person taking care of personal grooming?: None Help from another person toileting, which includes using toliet, bedpan, or urinal?: A Little Help from another person bathing (including washing, rinsing, drying)?: A Little Help from another person to put on and taking off regular upper body clothing?: None Help from another person to put on and taking off regular lower body clothing?: A Little 6 Click Score: 21   End of Session Equipment Utilized During Treatment: Rolling walker Nurse Communication: Mobility status;Other (comment)(Already has DME and A/E from previous hip surgeries)  Activity Tolerance: Patient tolerated treatment well;No increased pain Patient left: in chair;with call  bell/phone within reach                   Time: 0752-0801 OT Time Calculation (min): 9 min Charges:  OT General Charges $OT Visit: 1 Visit OT Evaluation $OT Eval Moderate Complexity: 1 Mod   Barnhill, Amy Beth Dixon, OTR/L 07/13/2018, 9:01 AM

## 2018-07-13 NOTE — Progress Notes (Signed)
Patient discharge home with wife and daughters.  AVS discussed .  IV removed.  Patient is comfortavle ,  Vital sign stable upon discharge

## 2018-07-13 NOTE — Progress Notes (Signed)
Patient ID: Marcus Hutchinson, male   DOB: 1961/02/06, 57 y.o.   MRN: [id number] Vital signs are stable incision is clean and dry motor function is good in the lower extremities.  Patient notes that preoperative pain is much less.  I have advised that he can be discharged home today.

## 2018-07-13 NOTE — Care Management Note (Signed)
Case Management Note  Patient Details  Name: JEYDEN COFFELT MRN: [id number] Date of Birth: 1961/04/11  Subjective/Objective:    Pt is s/p lumbar surgery. He is from home with spouse.  DME: cane, walker, shower chair, 3 in 1, CPAP No issues obtaining his medications.  Family able to provide transportation until pt is driving again.                Action/Plan: No f/u per PT/OT and no DME needs.  Family able to provide transport home.   I have discussed the patient's current level of function related to lumbar surgery with the patient and wife.  They acknowledge understanding of this and feel they can provide the level of care the patient will need at home.      Expected Discharge Date:                  Expected Discharge Plan:  Home/Self Care  In-House Referral:     Discharge planning Services     Post Acute Care Choice:    Choice offered to:     DME Arranged:    DME Agency:     HH Arranged:    HH Agency:     Status of Service:  In process, will continue to follow  If discussed at Long Length of Stay Meetings, dates discussed:    Additional Comments:  Pollie Friar, RN 07/13/2018, 12:18 PM

## 2018-07-13 NOTE — Evaluation (Signed)
Physical Therapy Evaluation Patient Details Name: Marcus Hutchinson MRN: [id number] DOB: 1960/12/20 Today's Date: 07/13/2018   History of Present Illness  Patient is a 57 y/o male s/p  Bilateral laminotomies and foraminotomies L4-L5 with decompression of subarticular stenosis L4-L5 on 07/12/18. PMH significant for B THA, anxiety, HTN, OSA.     Clinical Impression  Mr. Hoar admitted with the above listed diagnosis. Patient and wife report that prior to admission patient only ambulating short distances with RW with wife assisting with ADLs. Patient today performing all mobility at general min guard level for safety - no LOB or overt instability. Patient with good tolerance for gait in hallway with RW - reports this as being further gait distance achieved in quite some time. Educated on back precautions with handout given and with good verbal understanding. PT to continue to follow acutely.     Follow Up Recommendations Follow surgeon's recommendation for DC plan and follow-up therapies    Equipment Recommendations  None recommended by PT    Recommendations for Other Services OT consult(as ordered)     Precautions / Restrictions Precautions Precautions: Fall;Back Precaution Booklet Issued: Yes (comment) Precaution Comments: reviewed back precautions Restrictions Weight Bearing Restrictions: No      Mobility  Bed Mobility Overal bed mobility: Modified Independent             General bed mobility comments: able to perform supine to sidelying and sidelying to sit with Mod I   Transfers Overall transfer level: Needs assistance Equipment used: Rolling walker (2 wheeled) Transfers: Sit to/from Stand Sit to Stand: Supervision         General transfer comment: for safety and immediate standing balance. no LOB or overt instability  Ambulation/Gait Ambulation/Gait assistance: Min guard;Supervision Gait Distance (Feet): 120 Feet Assistive device: Rolling walker (2  wheeled) Gait Pattern/deviations: Step-through pattern;Decreased stride length Gait velocity: WNL   General Gait Details: only using 2 fingers to push RW - pateint with subjective reports of this being longest walk in quite some time - no LOB   Stairs            Wheelchair Mobility    Modified Rankin (Stroke Patients Only)       Balance Overall balance assessment: Mild deficits observed, not formally tested                                           Pertinent Vitals/Pain Pain Assessment: 0-10 Pain Score: 4  Pain Location: low back Pain Descriptors / Indicators: Sore Pain Intervention(s): Limited activity within patient's tolerance;Monitored during session;Repositioned    Home Living Family/patient expects to be discharged to:: Private residence Living Arrangements: Spouse/significant other;Children   Type of Home: House Home Access: Ramped entrance(1 step to enter home)     Home Layout: Two level(downstairs basement - lives on main floor) Home Equipment: Environmental consultant - 2 wheels;Cane - single point;Shower seat;Hand held Management consultant)      Prior Function Level of Independence: Needs assistance   Gait / Transfers Assistance Needed: use of RW for short distances  ADL's / Homemaking Assistance Needed: assist for ADLs - bathing and dressing         Hand Dominance        Extremity/Trunk Assessment   Upper Extremity Assessment Upper Extremity Assessment: Defer to OT evaluation    Lower Extremity Assessment Lower Extremity Assessment: Overall WFL for tasks  assessed    Cervical / Trunk Assessment Cervical / Trunk Assessment: Normal  Communication   Communication: No difficulties  Cognition Arousal/Alertness: Awake/alert Behavior During Therapy: WFL for tasks assessed/performed Overall Cognitive Status: Within Functional Limits for tasks assessed                                        General Comments  General comments (skin integrity, edema, etc.): wife present and supportive - will be taking off of work for 1-2 weeks to assist patient    Exercises     Assessment/Plan    PT Assessment Patient needs continued PT services  PT Problem List Decreased strength;Decreased activity tolerance;Decreased balance;Decreased mobility       PT Treatment Interventions DME instruction;Gait training;Stair training;Functional mobility training;Therapeutic activities;Therapeutic exercise;Balance training;Neuromuscular re-education;Patient/family education    PT Goals (Current goals can be found in the Care Plan section)  Acute Rehab PT Goals Patient Stated Goal: return home today PT Goal Formulation: With patient Time For Goal Achievement: 07/27/18 Potential to Achieve Goals: Good    Frequency Min 5X/week   Barriers to discharge        Co-evaluation               AM-PAC PT "6 Clicks" Mobility  Outcome Measure Help needed turning from your back to your side while in a flat bed without using bedrails?: None Help needed moving from lying on your back to sitting on the side of a flat bed without using bedrails?: None Help needed moving to and from a bed to a chair (including a wheelchair)?: A Little Help needed standing up from a chair using your arms (e.g., wheelchair or bedside chair)?: A Little Help needed to walk in hospital room?: A Little Help needed climbing 3-5 steps with a railing? : A Little 6 Click Score: 20    End of Session   Activity Tolerance: Patient tolerated treatment well Patient left: in chair;with call bell/phone within reach;with family/visitor present Nurse Communication: Mobility status PT Visit Diagnosis: Other abnormalities of gait and mobility (R26.89)    Time: 3888-2800 PT Time Calculation (min) (ACUTE ONLY): 10 min   Charges:   PT Evaluation $PT Eval Moderate Complexity: 1 Mod          Lanney Gins, PT, DPT Supplemental Physical  Therapist 07/13/18 8:21 AM Pager: [phone] Office: [phone]

## 2018-07-19 NOTE — Anesthesia Postprocedure Evaluation (Signed)
Anesthesia Post Note  Patient: Marcus Hutchinson  Procedure(s) Performed: Bilateral Lumbar four-five Laminotomy/Foraminotomy (Bilateral Back)     Patient location during evaluation: PACU Anesthesia Type: General Level of consciousness: awake and alert Pain management: pain level controlled Vital Signs Assessment: post-procedure vital signs reviewed and stable Respiratory status: spontaneous breathing, nonlabored ventilation, respiratory function stable and patient connected to nasal cannula oxygen Cardiovascular status: blood pressure returned to baseline and stable Postop Assessment: no apparent nausea or vomiting Anesthetic complications: no    Last Vitals:  Vitals:   07/13/18 0857 07/13/18 1250  BP: 126/61 134/80  Pulse: 70 72  Resp: 20 20  Temp: 36.5 C 36.7 C  SpO2: 93% 93%    Last Pain:  Vitals:   07/13/18 1250  TempSrc: Oral  PainSc:                  Yaiza Palazzola

## 2018-12-13 ENCOUNTER — Ambulatory Visit (INDEPENDENT_AMBULATORY_CARE_PROVIDER_SITE_OTHER): Payer: BLUE CROSS/BLUE SHIELD | Admitting: Otolaryngology

## 2018-12-13 DIAGNOSIS — H60331 Swimmer's ear, right ear: Secondary | ICD-10-CM

## 2018-12-27 ENCOUNTER — Other Ambulatory Visit: Payer: Self-pay

## 2018-12-27 ENCOUNTER — Ambulatory Visit (INDEPENDENT_AMBULATORY_CARE_PROVIDER_SITE_OTHER): Payer: BLUE CROSS/BLUE SHIELD | Admitting: Otolaryngology

## 2018-12-27 DIAGNOSIS — H66011 Acute suppurative otitis media with spontaneous rupture of ear drum, right ear: Secondary | ICD-10-CM | POA: Diagnosis not present

## 2019-02-10 ENCOUNTER — Ambulatory Visit (INDEPENDENT_AMBULATORY_CARE_PROVIDER_SITE_OTHER): Payer: BC Managed Care – PPO | Admitting: Otolaryngology

## 2019-02-10 DIAGNOSIS — H903 Sensorineural hearing loss, bilateral: Secondary | ICD-10-CM

## 2019-02-10 DIAGNOSIS — H6983 Other specified disorders of Eustachian tube, bilateral: Secondary | ICD-10-CM | POA: Diagnosis not present

## 2019-07-27 IMAGING — RF DG MYELOGRAM LUMBAR
6 series · 6 of 6 positions shown · non-contrast
Comparison: Lumbar MRI 03/12/2018, CT 01/20/2018

CLINICAL DATA: Lumbar stenosis with neurogenic claudication

EXAM:
LUMBAR MYELOGRAM
PROCEDURE:
Lumbar puncture and intrathecal contrast administration were
performed by Dr. Locklear who will separately report for the portion
of the procedure. I personally supervised acquisition of the
myelogram images.
TECHNIQUE: Contiguous axial images were obtained through the Lumbar spine after
the intrathecal infusion of infusion. Coronal and sagittal
reconstructions were obtained of the axial image sets.

[Series 1: cp_standard · 0.17mm/px · 1 of 1 slices shown]
[im 1/1]
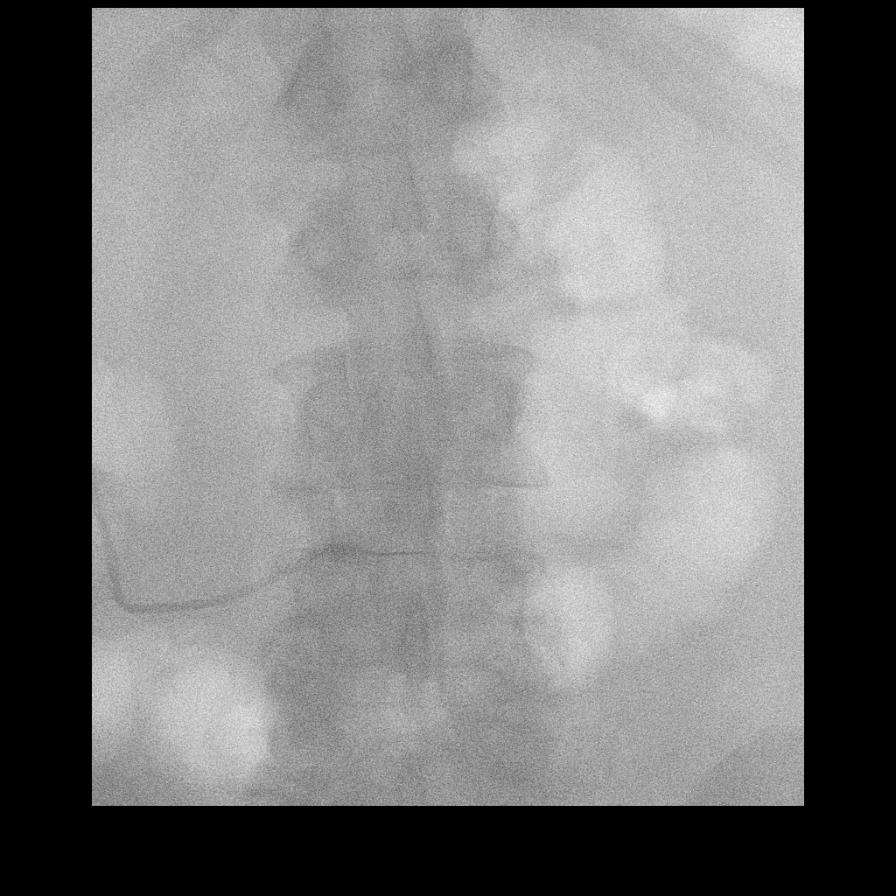

[Series 2: fluoro_myelogram_singleshot_bw · 0.18mm/px · 1 of 1 slices shown (1 of 5)]
[im 1/1]
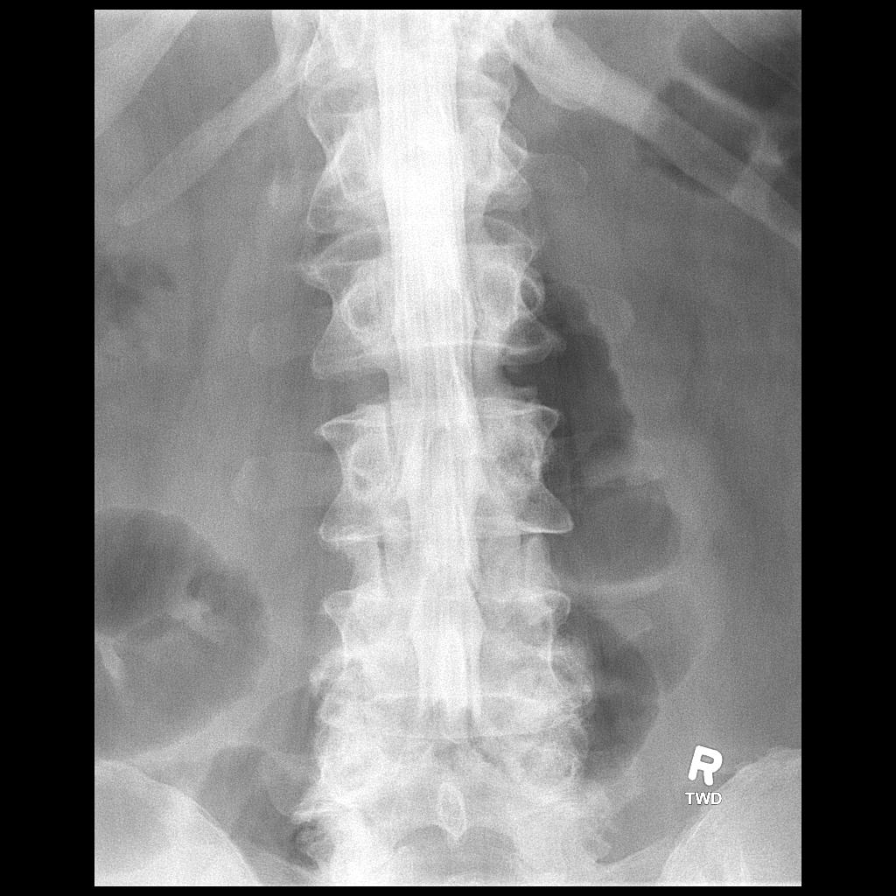

[Series 3: fluoro_myelogram_singleshot_bw · 0.19mm/px · 1 of 1 slices shown (2 of 5)]
[im 1/1]
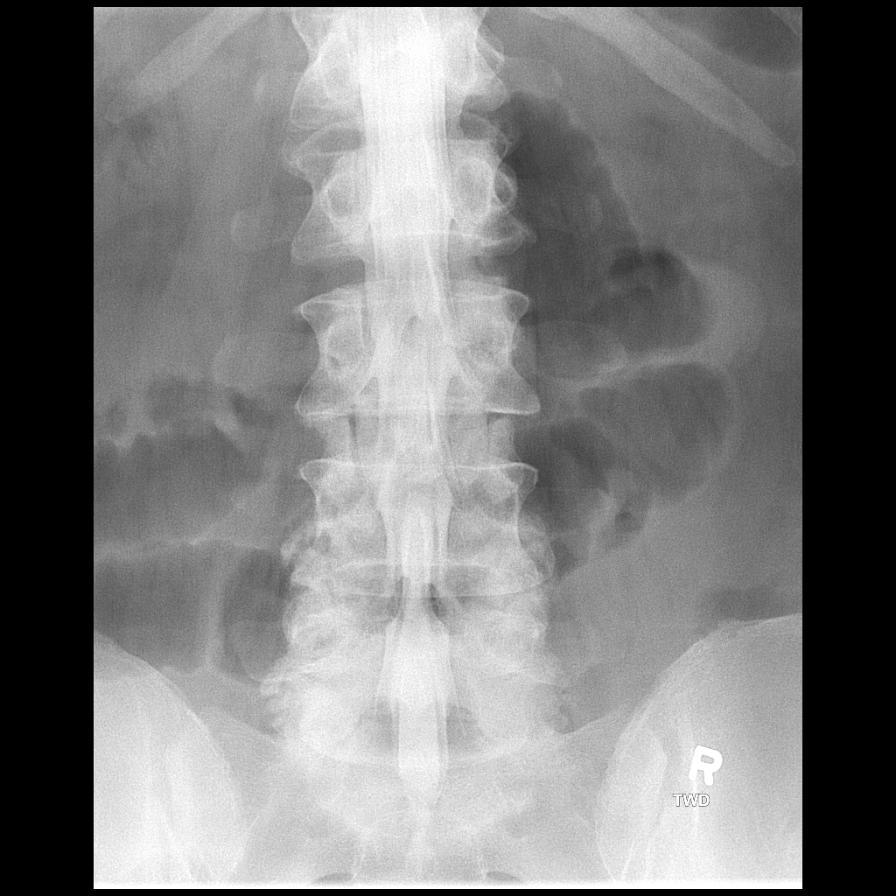

[Series 4: fluoro_myelogram_singleshot_bw · 0.18mm/px · 1 of 1 slices shown (3 of 5)]
[im 1/1]
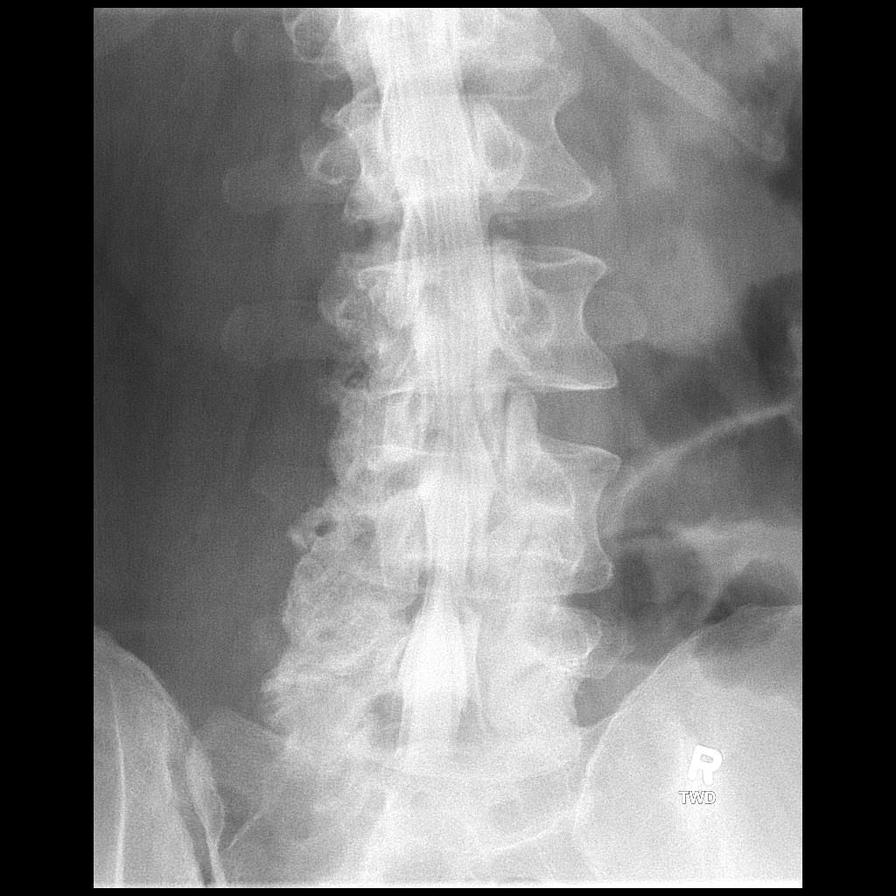

[Series 5: fluoro_myelogram_singleshot_bw · 0.17mm/px · 1 of 1 slices shown (4 of 5)]
[im 1/1]
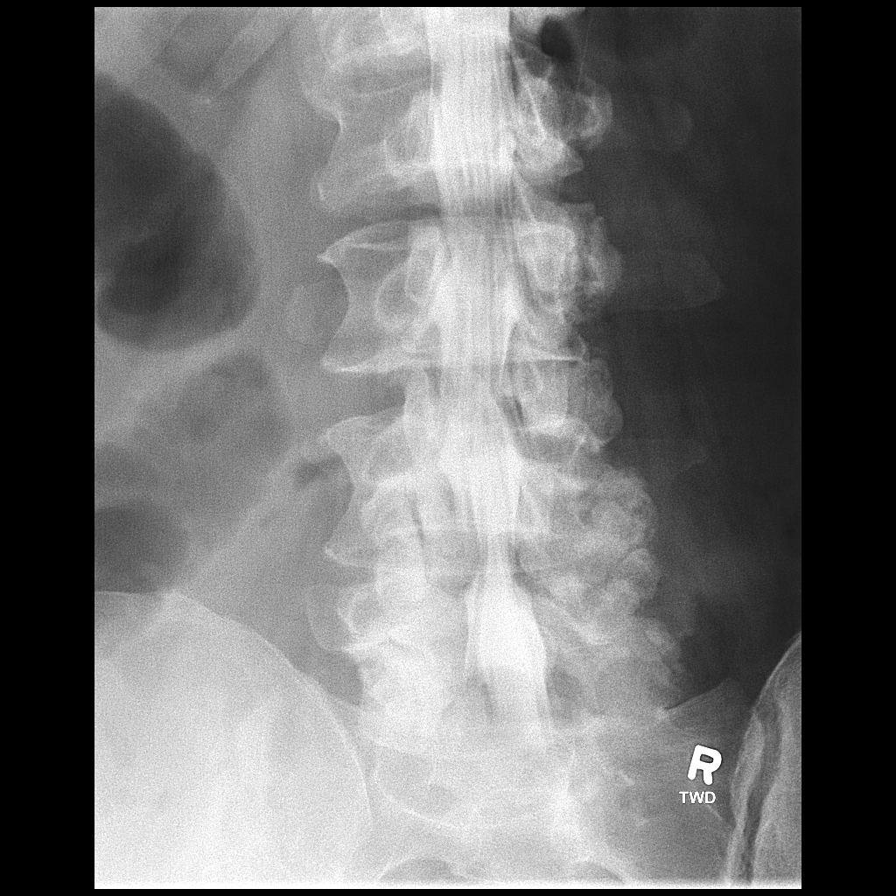

[Series 6: fluoro_myelogram_singleshot_bw · 0.18mm/px · 1 of 1 slices shown (5 of 5)]
[im 1/1]
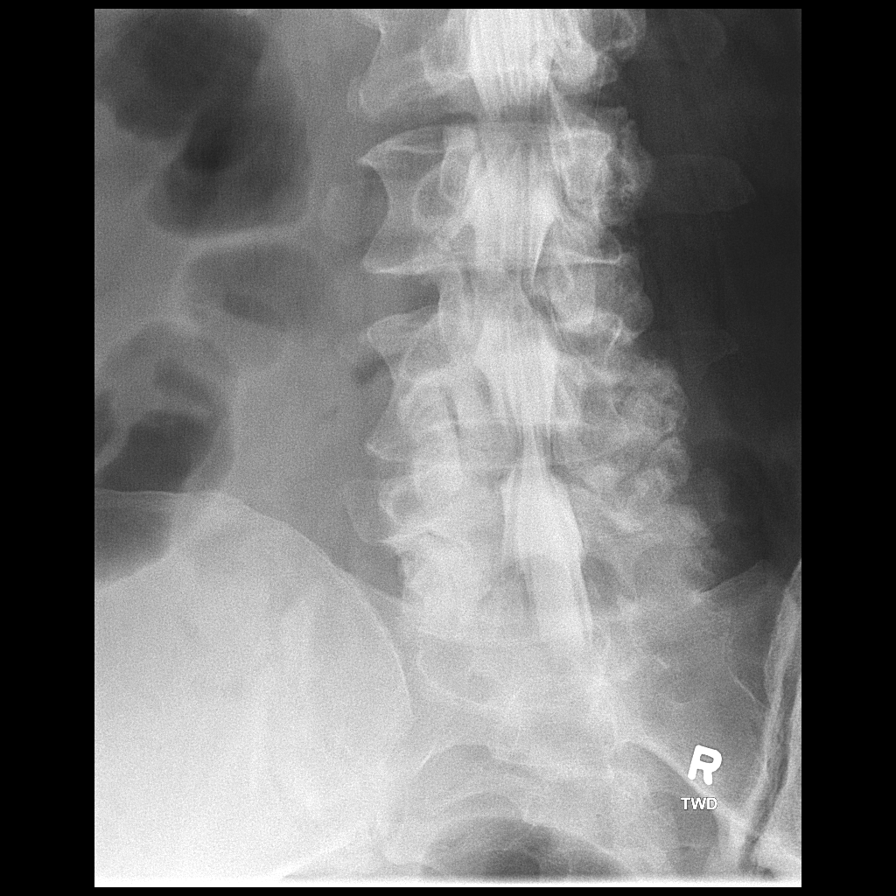

[6 of 6 positions shown; findings below may reference images not displayed]

FINDINGS: LUMBAR MYELOGRAM FINDINGS:

Moderate spinal stenosis at L4-5. Subarticular stenosis bilaterally
at L4-5. Facet degeneration at L4-5 and L5-S1. No intradural lesion.

CT LUMBAR MYELOGRAM FINDINGS:

Image quality limited due to large body habitus.

Mild anterolisthesis L4-5 unchanged from prior studies. Remaining
alignment normal. Negative for fracture or mass. Conus medullaris
terminates at T12-L1 and appears normal.

Paraspinous soft tissues negative.

T12-L1: Mild disc degeneration.  Negative for stenosis

L1-2: Mild disc degeneration and disc bulging without stenosis

L2-3: Diffuse disc bulging and facet degeneration causing mild
spinal stenosis. Mild subarticular stenosis bilaterally.

L3-4: Diffuse disc bulging. Severe facet degeneration with bilateral
facet joint effusions. Mild spinal stenosis. Moderate subarticular
stenosis bilaterally.

L4-5: Severe facet degeneration bilaterally causing marked
subarticular stenosis bilaterally. Moderate spinal stenosis.

L5-S1: Severe facet degeneration bilaterally causing moderate
subarticular stenosis bilaterally.
IMPRESSION: LUMBAR MYELOGRAM IMPRESSION:

Spinal stenosis L4-5 with lateral recess stenosis bilaterally

CT LUMBAR MYELOGRAM IMPRESSION:

Image quality is suboptimal due to large body habitus

Mild spinal stenosis at L2-3 and L3-4

Moderate spinal stenosis at L4-5 with marked subarticular stenosis
bilaterally. Severe facet degeneration at L4-5

Moderate subarticular stenosis bilaterally L5-S1 with severe facet
degeneration.

## 2020-07-12 ENCOUNTER — Other Ambulatory Visit (HOSPITAL_COMMUNITY): Payer: Self-pay | Admitting: Surgery

## 2020-07-12 ENCOUNTER — Other Ambulatory Visit: Payer: Self-pay | Admitting: Surgery

## 2020-07-23 ENCOUNTER — Other Ambulatory Visit: Payer: Self-pay

## 2020-07-23 ENCOUNTER — Ambulatory Visit (HOSPITAL_COMMUNITY)
Admission: RE | Admit: 2020-07-23 | Discharge: 2020-07-23 | Disposition: A | Discharge status: Home / Self Care | Payer: BC Managed Care – PPO | Source: Ambulatory Visit | Attending: Surgery | Admitting: Surgery

## 2020-08-02 ENCOUNTER — Ambulatory Visit: Payer: BC Managed Care – PPO | Admitting: Skilled Nursing Facility1

## 2020-08-07 ENCOUNTER — Ambulatory Visit: Payer: Medicare Other | Admitting: Skilled Nursing Facility1

## 2020-08-13 ENCOUNTER — Encounter: Payer: BC Managed Care – PPO | Attending: Surgery | Admitting: Skilled Nursing Facility1

## 2020-08-13 ENCOUNTER — Encounter: Payer: Self-pay | Admitting: Skilled Nursing Facility1

## 2020-08-13 ENCOUNTER — Other Ambulatory Visit: Payer: Self-pay

## 2020-08-13 DIAGNOSIS — Z79899 Other long term (current) drug therapy: Secondary | ICD-10-CM | POA: Diagnosis not present

## 2020-08-13 DIAGNOSIS — Z713 Dietary counseling and surveillance: Secondary | ICD-10-CM | POA: Insufficient documentation

## 2020-08-13 DIAGNOSIS — I10 Essential (primary) hypertension: Secondary | ICD-10-CM | POA: Diagnosis not present

## 2020-08-13 DIAGNOSIS — G4730 Sleep apnea, unspecified: Secondary | ICD-10-CM | POA: Insufficient documentation

## 2020-08-13 DIAGNOSIS — Z6841 Body Mass Index (BMI) 40.0 and over, adult: Secondary | ICD-10-CM | POA: Diagnosis not present

## 2020-08-13 DIAGNOSIS — E669 Obesity, unspecified: Secondary | ICD-10-CM

## 2020-08-13 NOTE — Progress Notes (Signed)
Nutrition Assessment for Bariatric Surgery Medical Nutrition Therapy Appt Start Time: 3:05   End Time: 4:11  Patient was seen on 08/14/2019 for Pre-Operative Nutrition Assessment. Letter of approval faxed to [facility] bariatric surgery program coordinator on 08/14/2019  Referral stated Supervised Weight Loss (SWL) visits needed: 6  Planned surgery: sleeve gastrectomy  Pt expectation of surgery: to be able to move more and do more things around the house Pt expectation of dietitian: none stated    NUTRITION ASSESSMENT   Anthropometrics  Start weight at [facility]: 475.8 lbs (date: 08/14/2019)  Height: 74 in BMI: 61.09 kg/m2     Clinical  Medical hx: depression, anxiety, sleep apnea, kidney stones Medications: see list Labs: Vitamin D, A1C 5.9, TSH 5.67, testosterone 89 Notable signs/symptoms: hip pain (both replaced), back pain, neck pain Any previous deficiencies? Vitamin D (14)  Micronutrient Nutrition Focused Physical Exam: Hair: No issues observed Eyes: No issues observed Mouth: No issues observed Neck: No issues observed Nails: No issues observed Skin: No issues observed  Lifestyle & Dietary Hx  Pt arrives with his supportive wife.   24-Hr Dietary Recall First Meal:  Snack:  Second Meal: sandwich + chips Snack: popcorn Third Meal: hamburgers + chips Snack: chips or cake Beverages: water, sweet tea, coffee, soda   Estimated Energy Needs Calories: 1800  NUTRITION DIAGNOSIS  Overweight/obesity ([place]-3.3) related to past poor dietary habits and physical inactivity as evidenced by patient w/ planned sleeve gastrectomy surgery following dietary guidelines for continued weight loss.    NUTRITION INTERVENTION  Nutrition counseling (C-1) and education (E-2) to facilitate bariatric surgery goals.   Pre-Op Goals Reviewed with the Patient . Track food and beverage intake (pen and paper, MyFitness Pal, Baritastic app, etc.) . Make healthy food choices while  monitoring portion sizes . Consume 3 meals per day or try to eat every 3-5 hours . Avoid concentrated sugars and fried foods . Keep sugar & fat in the single digits per serving on food labels . Practice CHEWING your food (aim for applesauce consistency) . Practice not drinking 15 minutes before, during, and 30 minutes after each meal and snack . Avoid all carbonated beverages (ex: soda, sparkling beverages)  . Limit caffeinated beverages (ex: coffee, tea, energy drinks) . Avoid all sugar-sweetened beverages (ex: regular soda, sports drinks)  . Avoid alcohol  . Aim for 64-100 ounces of FLUID daily (with at least half of fluid intake being plain water)  . Aim for at least 60-80 grams of PROTEIN daily . Look for a liquid protein source that contains ?15 g protein and ?5 g carbohydrate (ex: shakes, drinks, shots) . Make a list of non-food related activities . Physical activity is an important part of a healthy lifestyle so keep it moving! The goal is to reach 150 minutes of exercise per week, including cardiovascular and weight baring activity.  *Goals that are bolded indicate the pt would like to start working towards these  Handouts Provided Include  . Bariatric Surgery handouts (Nutrition Visits, Pre-Op Goals, Protein Shakes, Vitamins & Minerals)  Learning Style & Readiness for Change Teaching method utilized: Visual & Auditory  Demonstrated degree of understanding via: Teach Back  Readiness Level: pre contemplative  Barriers to learning/adherence to lifestyle change: unknown at this time   RD's Notes for Next Visit . Assess pts adherence to chosen goals      MONITORING & EVALUATION Dietary intake, weekly physical activity, body weight, and pre-op goals reached at next nutrition visit.    Next  Steps  Patient is to follow up at [facility] for Pre-Op Class >2 weeks before surgery for further nutrition education.

## 2020-09-10 ENCOUNTER — Other Ambulatory Visit: Payer: Self-pay

## 2020-09-10 ENCOUNTER — Encounter: Payer: BC Managed Care – PPO | Admitting: Skilled Nursing Facility1

## 2020-09-10 DIAGNOSIS — E669 Obesity, unspecified: Secondary | ICD-10-CM

## 2020-09-10 NOTE — Progress Notes (Signed)
Supervised Weight Loss Visit Bariatric Nutrition Education  Planned Surgery: sleeve gastrectomy  Pt Expectation of Surgery/ Goals: to lose weight  1 out of 6 SWL Appointments    NUTRITION ASSESSMENT  Anthropometrics  Start weight at [facility]: 475.8 lbs (date: 08/14/2019)  Weight: 473.5 lbs Height: 74 in BMI: 60.78 kg/m2     Clinical  Medical hx: depression, anxiety, sleep apnea, kidney stones Medications: see list Labs: Vitamin D, A1C 5.9, TSH 5.67, testosterone 89 Notable signs/symptoms: hip pain (both replaced), back pain, neck pain Any previous deficiencies? Vitamin D (14)   Lifestyle & Dietary Hx  Arrives with limited mobility stating he forgot his cane.  Pt arrives with his eldest daughter. Pt states he has started a vitamin D supplement. Pt states he was working on chewing well.   Estimated daily fluid intake: 64+ oz Supplements: vitamin D Current average weekly physical activity: ADL's  24-Hr Dietary Recall First Meal: 2 sausage and 2 egg or skipped  Snack:  Second Meal: sandwich Snack: fire ball candy Third Meal: 2 slices pizza Snack:  Beverages: water, 2% milk  Estimated Energy Needs Calories: 1800  NUTRITION DIAGNOSIS  Overweight/obesity ([place]-3.3) related to past poor dietary habits and physical inactivity as evidenced by patient w/ planned sleeve gastrectomy surgery following dietary guidelines for continued weight loss.   NUTRITION INTERVENTION  Nutrition counseling (C-1) and education (E-2) to facilitate bariatric surgery goals.  Pre-Op Goals Progress & New Goals -Avoid all carbonated beverages (ex: soda, sparkling beverages) -Practice CHEWING your food (aim for applesauce consistency) -Not drinking with meals  -NEW: eat breakfast: like a protein shake + fruit  -NEW: try bed yoga to get you up and moving   Handouts Provided Include     Learning Style & Readiness for Change Teaching method utilized: Visual & Auditory  Demonstrated degree of  understanding via: Teach Back  Readiness Level: Action Barriers to learning/adherence to lifestyle change: none identified   RD's Notes for next Visit  . Assess pts adherence to chosen goals   MONITORING & EVALUATION Dietary intake, weekly physical activity, body weight, and pre-op goals in 1 month.   Next Steps  Patient is to return to [facility]

## 2020-09-13 ENCOUNTER — Encounter: Payer: Self-pay | Admitting: Cardiology

## 2020-09-13 ENCOUNTER — Telehealth: Payer: Self-pay | Admitting: Cardiology

## 2020-09-13 ENCOUNTER — Ambulatory Visit (INDEPENDENT_AMBULATORY_CARE_PROVIDER_SITE_OTHER): Payer: BC Managed Care – PPO | Admitting: Cardiology

## 2020-09-13 VITALS — BP 120/90 | HR 68 | Ht 74.0 in | Wt >= 6400 oz

## 2020-09-13 DIAGNOSIS — R0602 Shortness of breath: Secondary | ICD-10-CM | POA: Diagnosis not present

## 2020-09-13 DIAGNOSIS — Z01810 Encounter for preprocedural cardiovascular examination: Secondary | ICD-10-CM | POA: Diagnosis not present

## 2020-09-13 NOTE — Progress Notes (Signed)
Clinical Summary [name] is a 60 y.o.male seen today as a new patient for the following medical problems.  1.Chronic  SOB - started approximately 5-6 months ago. Occurs with activity, DOE walking room to room at home - occasional chest pain left sided, stinging 2-3/10. No other associated symptoms. Lasts <171minute. Occurs few times a week. Can occur at rest or with exertion.  - some recent LE edema  - [facility]: "technically limited study, LVEF WNL, diastolic function is reduced" - GXT 2013 no ischemia, though only exercised 4 minutes - heart cath 2004 with normal coronaries - weight in 10/2015 370 lbs. Home weight most recently up to 408 lbs.  - 04/2016 CPX no ischemia, no evidence of cardiac limitation. 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   - last visit given his weight gain and SOB he was started on lasix. In fairly short period of time his Cr and BUN elevated suggesting hypovolemia, and his lasix was initially held and then changed to lasix 40mg  every other day. SOB is unchanged.    - no recent chest pains - chronic SOB he relates to deconditioning - limited by chronic back and hip pain - 2019 back surgery without complications.       2. Preoperative evaluation - consinering bariatric surgery   Past Medical History:  Diagnosis Date  . Anxiety   . Arthritis    osteoarthritis, spinal stenosis  . Depression   . History of kidney stones    x 3 episodes- none in awhile.  [name]. Hypertension   . Sleep apnea    cpap use pressure 17     Allergies  Allergen Reactions  . Latex Other (See Comments)    Blisters skin  . Tape Other (See Comments)    Blisters skin- paper tape seems to be ok **ADHESIVE**     Current Outpatient Medications  Medication Sig Dispense Refill  . aspirin EC 81 MG tablet Take 81 mg by mouth daily.    [name]. buPROPion (WELLBUTRIN XL) 150 MG 24 hr tablet Take 150 mg by mouth 2 (two) times daily.  3  .  diazepam (VALIUM) 5 MG tablet Take 1 tablet (5 mg total) by mouth every 6 (six) hours as needed for muscle spasms. 30 tablet 0  . metoprolol succinate (TOPROL-XL) 25 MG 24 hr tablet Take 25 mg by mouth daily.  3  . Multiple Vitamins-Minerals (MULTIVITAMIN WITH MINERALS) tablet Take 1 tablet by mouth daily.    . naproxen sodium (ALEVE) 220 MG tablet Take 440 mg by mouth daily.     [name]. oxyCODONE-acetaminophen (PERCOCET/ROXICET) 5-325 MG tablet Take 1-2 tablets by mouth every 3 (three) hours as needed for severe pain. 60 tablet 0  . tetrahydrozoline-zinc (VISINE-AC) 0.05-0.25 % ophthalmic solution Place 2 drops into both eyes daily as needed (for dry eyes).     No current facility-administered medications for this visit.   Facility-Administered Medications Ordered in Other Visits  Medication Dose Route Frequency Provider Last Rate Last Admin  . ondansetron (ZOFRAN) 4 mg in sodium chloride 0.9 % 50 mL IVPB  4 mg Intravenous Q6H PRN Barnett AbuElsner, Henry, MD         Past Surgical History:  Procedure Laterality Date  . BACK SURGERY    . CARDIAC CATHETERIZATION     10 yrs ago-normal  . CHOLECYSTECTOMY     laparoscopic  . FINGER SURGERY Right    little finger tip reattachment(slight decrease sensation).  . HERNIA  REPAIR Left    LIH(left inguinal Hernia) repair  . JOINT REPLACEMENT    . LUMBAR LAMINECTOMY Bilateral 07/12/2018   Lumbar four-five Laminotomy/Foraminotomy  . LUMBAR LAMINECTOMY/DECOMPRESSION MICRODISCECTOMY Bilateral 07/12/2018   Procedure: Bilateral Lumbar four-five Laminotomy/Foraminotomy;  Surgeon: Kristeen Miss, MD;  Location: [facility];  Service: Neurosurgery;  Laterality: Bilateral;  . MOLE REMOVAL Left 07/01/2018   shoulder  . MYRINGOTOMY Right 10/2017  . SHOULDER ARTHROSCOPY DISTAL CLAVICLE EXCISION AND OPEN ROTATOR CUFF REPAIR Right    '09  . TOTAL HIP ARTHROPLASTY Left 11/07/2015   Procedure: LEFT TOTAL HIP ARTHROPLASTY POSTERIOR  APPROACH;  Surgeon: Gaynelle Arabian, MD;  Location: [facility];  Service: Orthopedics;  Laterality: Left;  . TOTAL HIP ARTHROPLASTY Right 2011     Allergies  Allergen Reactions  . Latex Other (See Comments)    Blisters skin  . Tape Other (See Comments)    Blisters skin- paper tape seems to be ok **ADHESIVE**      Family History  Problem Relation Age of Onset  . Arthritis Mother   . Diabetes Mother   . Heart disease Father        stent placement  . Heart attack Father   . Heart failure Maternal Grandfather   . Stroke Maternal Uncle   . Heart attack Paternal Uncle      Social History Marcus Hutchinson reports that he quit smoking about 36 years ago. His smoking use included cigarettes. He started smoking about 41 years ago. He has a 3.50 pack-year smoking history. He has never used smokeless tobacco. Marcus Hutchinson reports no history of alcohol use.   Review of Systems CONSTITUTIONAL: No weight loss, fever, chills, weakness or fatigue.  HEENT: Eyes: No visual loss, blurred vision, double vision or yellow sclerae.No hearing loss, sneezing, congestion, runny nose or sore throat.  SKIN: No rash or itching.  CARDIOVASCULAR: per hpi RESPIRATORY: per hpi GASTROINTESTINAL: No anorexia, nausea, vomiting or diarrhea. No abdominal pain or blood.  GENITOURINARY: No burning on urination, no polyuria NEUROLOGICAL: No headache, dizziness, syncope, paralysis, ataxia, numbness or tingling in the extremities. No change in bowel or bladder control.  MUSCULOSKELETAL: No muscle, back pain, joint pain or stiffness.  LYMPHATICS: No enlarged nodes. No history of splenectomy.  PSYCHIATRIC: No history of depression or anxiety.  ENDOCRINOLOGIC: No reports of sweating, cold or heat intolerance. No polyuria or polydipsia.  [name]   Physical Examination Today's Vitals   09/13/20 1314  BP: 120/90  Pulse: 68  SpO2: 97%  Weight: (!) 475 lb 9.6 oz (215.7 kg)  Height: 6\' 2"  (1.88 m)   Body mass index is 61.06 kg/m.  Gen: resting comfortably, no acute  distress HEENT: no scleral icterus, pupils equal round and reactive, no palptable cervical adenopathy,  CV: RRR, no m/rg, no jvd Resp: Clear to auscultation bilaterally GI: abdomen is soft, non-tender, non-distended, normal bowel sounds, no hepatosplenomegaly MSK: extremities are warm, no edema.  Skin: warm, no rash Neuro:  no focal deficits Psych: appropriate affect   Diagnostic Studies 03/2003 cath RESULTS:  HEMODYNAMICS: Left ventricular pressure 116/20. Aortic pressure 120/84. There is no aortic valve gradient.  LEFT VENTRICULOGRAM: Wall motion is normal. Ejection fraction is estimated at greater than or equal to 60%. There is no mitral regurgitation.  CORONARY ARTERIOGRAPHY: (Right dominant) Left main is normal.  Left anterior descending artery has minor luminal irregularities in the proximal vessel. The LAD gives rise to a single normal sized diagonal Christabelle Hanzlik.  Left circumflex has minor luminal irregularities in the  proximal vessel. The circumflex gives rise to a small but normal-sized first marginal and a large second marginal Tashiba Timoney.  Right coronary artery is a large, dominant vessel. It gives rise to a large posterior descending artery and a normal sized posterolateral Badr Piedra. The posterior descending artery has minor luminal irregularities.   IMPRESSION: 1. Normal left ventricular systolic function. 2. No significant coronary artery disease identified.     Assessment and Plan  1. Chronic SOB - very extensive testing over the years has been fairly benign other than some diasotlic dysfunction. CPX suggested symptosm related to weight, restrictive lung phsyiology - chronic symptoms unchanged, he is more sedentary now due to chronic back and hip pains - given upcoming surgery would repeat echo to assure no significant changes that may pose higher risk for his elective surgery  2. Preoperative evaluation - considering bariatric  surgery - exertion mainly limited by chronic back and hip pains - extensive testing over the years has been benign as far as significant cardiac disease. Most recently CPX in 2017 without ischemia. Body habitus makes noninvasive stress testing a nonoption, with prior testing being benign and no symptoms I do not see anything that would warrant invasive testing. Would plan for echo to reassess cardiac function, if no significant findings would recommend proceeding with surgery as planned.   F/u pending echo, if normal echo f/u just as needed.     Arnoldo Lenis, M.D.

## 2020-09-13 NOTE — Patient Instructions (Signed)
Medication Instructions:   Your physician recommends that you continue on your current medications as directed. Please refer to the Current Medication list given to you today.  Labwork:  none  Testing/Procedures: Your physician has requested that you have an echocardiogram with definity. Echocardiography is a painless test that uses sound waves to create images of your heart. It provides your doctor with information about the size and shape of your heart and how well your heart's chambers and valves are working. This procedure takes approximately one hour. There are no restrictions for this procedure.  Follow-Up:  Your physician recommends that you schedule a follow-up appointment in: pending.   Any Other Special Instructions Will Be Listed Below (If Applicable).  If you need a refill on your cardiac medications before your next appointment, please call your pharmacy.

## 2020-09-13 NOTE — Telephone Encounter (Signed)
Pre-cert Verification for the following procedure    2 D Echo w/definity dx: SOB  DATE: 09/19/2020  LOCATION:   [facility]

## 2020-09-13 NOTE — Addendum Note (Signed)
Addended by: Merlene Laughter on: 09/13/2020 01:47 PM   Modules accepted: Orders

## 2020-09-19 ENCOUNTER — Ambulatory Visit (HOSPITAL_COMMUNITY): Payer: Medicare Other

## 2020-09-26 ENCOUNTER — Ambulatory Visit (HOSPITAL_COMMUNITY)
Admission: RE | Admit: 2020-09-26 | Discharge: 2020-09-26 | Disposition: A | Discharge status: Home / Self Care | Payer: BC Managed Care – PPO | Source: Ambulatory Visit | Attending: Cardiology | Admitting: Cardiology

## 2020-09-26 ENCOUNTER — Other Ambulatory Visit: Payer: Self-pay

## 2020-09-26 DIAGNOSIS — R0602 Shortness of breath: Secondary | ICD-10-CM

## 2020-09-26 MED ORDER — PERFLUTREN LIPID MICROSPHERE
1.0000 mL | INTRAVENOUS | Status: AC | PRN
  Administered 2020-09-26: 2 mL via INTRAVENOUS
  Filled 2020-09-26: qty 10

## 2020-09-26 NOTE — Progress Notes (Signed)
*  PRELIMINARY RESULTS* Echocardiogram 2D Echocardiogram has been performed with Definity.  Samuel Germany 09/26/2020, 4:33 PM

## 2020-10-10 ENCOUNTER — Encounter: Payer: BC Managed Care – PPO | Attending: Surgery | Admitting: Skilled Nursing Facility1

## 2020-10-10 ENCOUNTER — Ambulatory Visit: Payer: Medicare Other | Admitting: Skilled Nursing Facility1

## 2020-10-10 DIAGNOSIS — E669 Obesity, unspecified: Secondary | ICD-10-CM | POA: Diagnosis not present

## 2020-10-10 NOTE — Progress Notes (Signed)
Supervised Weight Loss Visit Bariatric Nutrition Education  Planned Surgery: sleeve gastrectomy  Pt Expectation of Surgery/ Goals: to lose weight  2 out of 6 SWL Appointments   appt conducted via virtual pt identified via DOB and name: pt accepts the limitations of this visit type  NUTRITION ASSESSMENT  Anthropometrics  Start weight at [facility]: 475.8 lbs (date: 08/14/2019)  Weight: pt declined due to not having the proper equipment at home Height: 74 in BMI:  kg/m2     Clinical  Medical hx: depression, anxiety, sleep apnea, kidney stones Medications: see list Labs: Vitamin D, A1C 5.9, TSH 5.67, testosterone 89 Notable signs/symptoms: hip pain (both replaced), back pain, neck pain Any previous deficiencies? Vitamin D (14)   Lifestyle & Dietary Hx   Pt states he needs someone to bring him to his appts and now that his daughter is working that is difficult: Dietitian advised pt it would be best for pt to come in person for next appt and last appt but if unable virtual can be done.  Pt states he has been snacking on nuts throughout the day. Pt states he has had a touch of gout this week and hurt his knee Dietitian helped pt to identify the cause of the gouty flare (increased beef/pork)  Estimated daily fluid intake: 64+ oz Supplements: vitamin D Current average weekly physical activity: ADL's  24-Hr Dietary Recall First Meal: 2 sausage and 2 egg or english muffin + egg + sausage + cheese Snack: nuts Second Meal: bologna sandwich Snack:  Third Meal: chicken +lettuce + cheese Snack:  Beverages: water, 2% milk  Estimated Energy Needs Calories: 1800  NUTRITION DIAGNOSIS  Overweight/obesity ([place]-3.3) related to past poor dietary habits and physical inactivity as evidenced by patient w/ planned sleeve gastrectomy surgery following dietary guidelines for continued weight loss.   NUTRITION INTERVENTION  Nutrition counseling (C-1) and education (E-2) to facilitate bariatric  surgery goals.  Pre-Op Goals Progress & New Goals -Avoid all carbonated beverages (ex: soda, sparkling beverages) -Practice CHEWING your food (aim for applesauce consistency) -Not drinking with meals  -continue: eat breakfast: like a protein shake + fruit  -continue: try bed yoga to get you up and moving  Continue: taking smaller bites -NEW: measure out 1/4 cup of nuts into a dish and eat out of that dish -NEW: leave a little meat and carb on the plate eat ALL of the non starchy vegetables  NEW: try skyr  Handouts Provided Include     Learning Style & Readiness for Change Teaching method utilized: Visual & Auditory  Demonstrated degree of understanding via: Teach Back  Readiness Level: Action Barriers to learning/adherence to lifestyle change: none identified   RD's Notes for next Visit  . Assess pts adherence to chosen goals   MONITORING & EVALUATION Dietary intake, weekly physical activity, body weight, and pre-op goals in 1 month.   Next Steps  Patient is to return to [facility]

## 2020-10-29 ENCOUNTER — Telehealth: Payer: Self-pay | Admitting: Cardiology

## 2020-10-29 ENCOUNTER — Encounter: Payer: Self-pay | Admitting: Physician Assistant

## 2020-10-29 LAB — ECHOCARDIOGRAM COMPLETE
AR max vel: 2.65 cm2
AV Area VTI: 2.79 cm2
AV Area mean vel: 2.34 cm2
AV Mean grad: 4.5 mmHg
AV Peak grad: 8.1 mmHg
Ao pk vel: 1.42 m/s
Area-P 1/2: 3.48 cm2
S' Lateral: 2.5 cm

## 2020-10-29 NOTE — Telephone Encounter (Signed)
Patient called requesting results of echo done Feb.2022.  Please call patient's cell phone.

## 2020-10-30 NOTE — Telephone Encounter (Signed)
Pt voiced understanding and appreciative    Sorry for the delay, computer error affected the echo being read when it was done. Normal echo, heart function is very good. No further cardiac testing is indicated, can f/u as needed. Nothing cardiac wise to limit him from bariatric surgery. If his bariatric doctors need a clearance form completed they can send to our office    Carlyle Dolly MD

## 2020-10-30 NOTE — Telephone Encounter (Signed)
Just resulted to Asencion Gowda Darcella Shiffman MD

## 2020-11-13 ENCOUNTER — Encounter: Payer: Self-pay | Admitting: Physician Assistant

## 2020-11-13 ENCOUNTER — Encounter: Payer: BC Managed Care – PPO | Attending: Surgery | Admitting: Skilled Nursing Facility1

## 2020-11-13 ENCOUNTER — Other Ambulatory Visit: Payer: Self-pay

## 2020-11-13 ENCOUNTER — Ambulatory Visit (INDEPENDENT_AMBULATORY_CARE_PROVIDER_SITE_OTHER): Payer: BC Managed Care – PPO | Admitting: Physician Assistant

## 2020-11-13 VITALS — BP 160/90 | HR 65 | Ht 74.0 in | Wt >= 6400 oz

## 2020-11-13 DIAGNOSIS — Z6841 Body Mass Index (BMI) 40.0 and over, adult: Secondary | ICD-10-CM | POA: Diagnosis not present

## 2020-11-13 DIAGNOSIS — G4730 Sleep apnea, unspecified: Secondary | ICD-10-CM | POA: Diagnosis not present

## 2020-11-13 DIAGNOSIS — Z713 Dietary counseling and surveillance: Secondary | ICD-10-CM | POA: Insufficient documentation

## 2020-11-13 DIAGNOSIS — Z1211 Encounter for screening for malignant neoplasm of colon: Secondary | ICD-10-CM | POA: Diagnosis not present

## 2020-11-13 DIAGNOSIS — Z1212 Encounter for screening for malignant neoplasm of rectum: Secondary | ICD-10-CM | POA: Diagnosis not present

## 2020-11-13 DIAGNOSIS — R142 Eructation: Secondary | ICD-10-CM

## 2020-11-13 DIAGNOSIS — E669 Obesity, unspecified: Secondary | ICD-10-CM

## 2020-11-13 MED ORDER — NA SULFATE-K SULFATE-MG SULF 17.5-3.13-1.6 GM/177ML PO SOLN: 1.0000 | Freq: Once | ORAL | 0 refills | Status: AC

## 2020-11-13 NOTE — Progress Notes (Signed)
Chief Complaint: Burping, screening for colon cancer  HPI:    Marcus Hutchinson is a 60 year old Caucasian male with a past medical history as listed below including morbid obesity, who was referred to me by Glenda Chroman, MD for a complaint of burping and need for screening colonoscopy.      Today, the patient presents to clinic and tells me that he is keying up for a gastric bypass surgery hopefully in July of this year.  Due to this his doctors are wanting him to have an EGD and colonoscopy prior.  Does tell me that he has an issue with burping a lot but has done this his whole life, denies any overt heartburn or reflux.  Denies other GI issues.    Denies fever, chills, weight loss, change in bowel habits, blood in his stool or family history of colon cancer.  Past Medical History:  Diagnosis Date  . Anxiety   . Arthritis    osteoarthritis, spinal stenosis  . Depression   . History of kidney stones    x 3 episodes- none in awhile.  [name] Hypertension   . Sleep apnea    cpap use pressure 17    Past Surgical History:  Procedure Laterality Date  . BACK SURGERY    . CARDIAC CATHETERIZATION     10 yrs ago-normal  . CHOLECYSTECTOMY     laparoscopic  . FINGER SURGERY Right    little finger tip reattachment(slight decrease sensation).  . HERNIA REPAIR Left    LIH(left inguinal Hernia) repair  . JOINT REPLACEMENT    . LUMBAR LAMINECTOMY Bilateral 07/12/2018   Lumbar four-five Laminotomy/Foraminotomy  . LUMBAR LAMINECTOMY/DECOMPRESSION MICRODISCECTOMY Bilateral 07/12/2018   Procedure: Bilateral Lumbar four-five Laminotomy/Foraminotomy;  Surgeon: Kristeen Miss, MD;  Location: [facility];  Service: Neurosurgery;  Laterality: Bilateral;  . MOLE REMOVAL Left 07/01/2018   shoulder  . MYRINGOTOMY Right 10/2017  . SHOULDER ARTHROSCOPY DISTAL CLAVICLE EXCISION AND OPEN ROTATOR CUFF REPAIR Right    '09  . TOTAL HIP ARTHROPLASTY Left 11/07/2015   Procedure: LEFT TOTAL HIP ARTHROPLASTY POSTERIOR   APPROACH;  Surgeon: Gaynelle Arabian, MD;  Location: [facility];  Service: Orthopedics;  Laterality: Left;  . TOTAL HIP ARTHROPLASTY Right 2011    Current Outpatient Medications  Medication Sig Dispense Refill  . aspirin EC 81 MG tablet Take 81 mg by mouth daily.    [name] buPROPion (WELLBUTRIN XL) 150 MG 24 hr tablet Take 150 mg by mouth 2 (two) times daily.  3  . fluticasone (FLONASE) 50 MCG/ACT nasal spray Place 2 sprays into both nostrils daily.    [name] gabapentin (NEURONTIN) 300 MG capsule Take 1 capsule by mouth 3 (three) times daily.    . metoprolol succinate (TOPROL-XL) 50 MG 24 hr tablet Take 50 mg by mouth daily.    . naproxen sodium (ALEVE) 220 MG tablet Take 440 mg by mouth daily.     [name] oxyCODONE-acetaminophen (PERCOCET) 10-325 MG tablet Take 1 tablet by mouth 3 (three) times daily as needed.    . Vitamin D, Ergocalciferol, (DRISDOL) 1.25 MG (50000 UNIT) CAPS capsule Take 50,000 Units by mouth 2 (two) times a week.     No current facility-administered medications for this visit.   Facility-Administered Medications Ordered in Other Visits  Medication Dose Route Frequency Provider Last Rate Last Admin  . ondansetron (ZOFRAN) 4 mg in sodium chloride 0.9 % 50 mL IVPB  4 mg Intravenous Q6H PRN Kristeen Miss, MD  Allergies as of 11/13/2020 - Review Complete 09/13/2020  Allergen Reaction Noted  . Tape Other (See Comments) 03/30/2015    Family History  Problem Relation Age of Onset  . Arthritis Mother   . Diabetes Mother   . Heart disease Father        stent placement  . Heart attack Father   . Heart failure Maternal Grandfather   . Stroke Maternal Uncle   . Heart attack Paternal Uncle     Social History   Socioeconomic History  . Marital status: Married    Spouse name: Not on file  . Number of children: Not on file  . Years of education: Not on file  . Highest education level: Not on file  Occupational History  . Not on file  Tobacco Use  . Smoking status: Former Smoker     Packs/day: 0.70    Years: 5.00    Pack years: 3.50    Types: Cigarettes    Start date: 05/01/1979    Quit date: 03/29/1984    Years since quitting: 36.6  . Smokeless tobacco: Never Used  Substance and Sexual Activity  . Alcohol use: No    Alcohol/week: 0.0 standard drinks  . Drug use: No  . Sexual activity: Yes  Other Topics Concern  . Not on file  Social History Narrative  . Not on file   Social Determinants of Health   Financial Resource Strain: Not on file  Food Insecurity: Not on file  Transportation Needs: Not on file  Physical Activity: Not on file  Stress: Not on file  Social Connections: Not on file  Intimate Partner Violence: Not on file    Review of Systems:    Constitutional: No weight loss, fever or chills Skin: No rash  Cardiovascular: No chest pain Respiratory: No SOB Gastrointestinal: See HPI and otherwise negative Genitourinary: No dysuria Neurological: No headache, dizziness or syncope Musculoskeletal: No new muscle or joint pain Hematologic: No bleeding Psychiatric: No history of depression or anxiety   Physical Exam:  Vital signs: BP (!) 160/90   Pulse 65   Ht 6\' 2"  (1.88 m)   Wt (!) 478 lb (216.8 kg)   BMI 61.37 kg/m   Constitutional:   Pleasant morbidly obese Caucasian male appears to be in NAD, Well developed, Well nourished, alert and cooperative Head:  Normocephalic and atraumatic. Eyes:   PEERL, EOMI. No icterus. Conjunctiva pink. Ears:  Normal auditory acuity. Neck:  Supple Throat: Oral cavity and pharynx without inflammation, swelling or lesion.  Respiratory: Respirations even and unlabored. Lungs clear to auscultation bilaterally.   No wheezes, crackles, or rhonchi.  Cardiovascular: Normal S1, S2. No MRG. Regular rate and rhythm. No peripheral edema, cyanosis or pallor.  Gastrointestinal:  Soft, nondistended, nontender. No rebound or guarding. Normal bowel sounds. No appreciable masses or hepatomegaly. Rectal:  Not performed.   Msk:  Symmetrical without gross deformities. Without edema, no deformity or joint abnormality. + Ambulates with a cane Neurologic:  Alert and  oriented x4;  grossly normal neurologically.  Skin:   Dry and intact without significant lesions or rashes. Psychiatric: Demonstrates good judgement and reason without abnormal affect or behaviors.  No recent labs or imaging.  Assessment: 1.  Burping: Consider relation to diet+/-GERD 2.  Screening for colorectal cancer: Patient is 73 and never had a screening for colorectal cancer 3.  Upcoming gastric bypass surgery: Trying to schedule in July  Plan: 1.  Scheduled patient for diagnostic EGD and colonoscopy in the hospital  with Dr. Rush Landmark given his BMI of greater than 50.  Did provide the patient with a detailed list of risks for the procedures and he agrees to proceed.  He will be Covid tested prior to these procedures. 2.  Patient to follow in clinic per recommendations from Dr. Rush Landmark after time of procedures.  Ellouise Newer, PA-C [facility] 11/13/2020, 10:19 AM  Cc: Glenda Chroman, MD

## 2020-11-13 NOTE — Progress Notes (Signed)
Supervised Weight Loss Visit Bariatric Nutrition Education  Planned Surgery: sleeve gastrectomy  Pt Expectation of Surgery/ Goals: to lose weight  3 out of 6 SWL Appointments    NUTRITION ASSESSMENT  Anthropometrics  Start weight at [facility]: 475.8 lbs (date: 08/14/2019)  Weight: 479 pounds Height: 74 in BMI: 61.50 kg/m2     Clinical  Medical hx: depression, anxiety, sleep apnea, kidney stones Medications: see list Labs: Vitamin D, A1C 5.9, TSH 5.67, testosterone 89 Notable signs/symptoms: hip pain (both replaced), back pain, neck pain Any previous deficiencies? Vitamin D (14)   Lifestyle & Dietary Hx  Pt states he has a colonoscopy upcoming.  Pt states he has been doing bed yoga but does not see where it helps him to move better.  Pt states he was considering he was not eating enough dietitian explained the improtance of conssitancy of appropriate calories thinking through the average for the week not the day to day with specific explanations.   Estimated daily fluid intake: 64+ oz Supplements: vitamin D Current average weekly physical activity: ADL's  24-Hr Dietary Recall First Meal: 2 sausage and 2 egg or english muffin + egg + sausage + cheese or croissant (jimmy deans) Snack: nuts Second Meal: 1/2 bologna sandwich + chips or trailmix Snack: trialmix Third Meal: chicken +lettuce + cheese or pizza  Snack:  Beverages: water, 2% milk, gingerale   Estimated Energy Needs Calories: 1800  NUTRITION DIAGNOSIS  Overweight/obesity ([place]-3.3) related to past poor dietary habits and physical inactivity as evidenced by patient w/ planned sleeve gastrectomy surgery following dietary guidelines for continued weight loss.   NUTRITION INTERVENTION  Nutrition counseling (C-1) and education (E-2) to facilitate bariatric surgery goals.  Pre-Op Goals Progress & New Goals -Avoid all carbonated beverages (ex: soda, sparkling beverages) -Practice CHEWING your food (aim for applesauce  consistency) -Not drinking with meals  -continue: eat breakfast: like a protein shake + fruit  -continue: try bed yoga to get you up and moving  Continue: taking smaller bites continue: measure out 1/4 cup of nuts into a dish and eat out of that dish inclduing trailmix continue: leave a little meat and carb on the plate eat ALL of the non starchy vegetables  continue: try skyr NEW: decrease in between meal snacking by choosing dynamic snacks and seeking a hobby like cleaning out extra wood to burn  Handouts Provided Include     Learning Style & Readiness for Change Teaching method utilized: Visual & Auditory  Demonstrated degree of understanding via: Teach Back  Readiness Level: Action Barriers to learning/adherence to lifestyle change: none identified   RD's Notes for next Visit  . Assess pts adherence to chosen goals   MONITORING & EVALUATION Dietary intake, weekly physical activity, body weight, and pre-op goals in 1 month.   Next Steps  Patient is to return to [place]  Return for next SWL

## 2020-11-13 NOTE — Patient Instructions (Signed)
If you are age 62 or older, your body mass index should be between 23-30. Your Body mass index is 61.37 kg/m. If this is out of the aforementioned range listed, please consider follow up with your Primary Care Provider.  If you are age 48 or younger, your body mass index should be between 19-25. Your Body mass index is 61.37 kg/m. If this is out of the aformentioned range listed, please consider follow up with your Primary Care Provider.   You have been scheduled for an endoscopy and colonoscopy. Please follow the written instructions given to you at your visit today. Please pick up your prep supplies at the pharmacy within the next 1-3 days. If you use inhalers (even only as needed), please bring them with you on the day of your procedure.  Thank you for choosing me and [facility].  Ellouise Newer, PA-C

## 2020-11-14 NOTE — Progress Notes (Signed)
Attending Physician's Attestation   I have reviewed the chart.   I agree with the Advanced Practitioner's note, impression, and recommendations with any updates as below.    Jane Broughton Mansouraty, MD [facility] Advanced Endoscopy Office # [phone]  

## 2020-12-12 ENCOUNTER — Encounter: Payer: Medicare Other | Admitting: Skilled Nursing Facility1

## 2020-12-12 ENCOUNTER — Ambulatory Visit: Payer: Medicare Other | Admitting: Skilled Nursing Facility1

## 2020-12-12 DIAGNOSIS — E669 Obesity, unspecified: Secondary | ICD-10-CM

## 2020-12-12 NOTE — Progress Notes (Signed)
Supervised Weight Loss Visit Bariatric Nutrition Education  Planned Surgery: sleeve gastrectomy  Pt Expectation of Surgery/ Goals: to lose weight  4 out of 6 SWL Appointments   Appt conducted virtually: pt agrees to limitations of this visit type, pt indentified by name and DOB.   NUTRITION ASSESSMENT  Anthropometrics  Start weight at [facility]: 475.8 lbs (date: 08/14/2019)  Weight: 479 pounds Height: 74 in BMI: 61.50 kg/m2     Clinical  Medical hx: depression, anxiety, sleep apnea, kidney stones Medications: see list Labs: Vitamin D, A1C 5.9, TSH 5.67, testosterone 89 Notable signs/symptoms: hip pain (both replaced), back pain, neck pain Any previous deficiencies? Vitamin D (14)   Lifestyle & Dietary Hx  Pt states he has a colonoscopy upcoming which he is nervous about.  Pt states this last month he has been limiting seeds/nuts to 1/4 cup, doing some bed yoga, doing some squats using a chair: aiming for 10 every couple hours-getting about 6 about 3 times a day. Pt states his son is supposed to bring him hand weights so he wants to start that. Pt states he has cut out all snacking after dinner. Pt states sticking to 1/4 cup of nuts or trail mix has been tough.  Pt states he sees where he could be dong better but is doing what he can (in reference to making dietary/lifestyle changes).   Estimated daily fluid intake: 64+ oz Supplements: vitamin D Current average weekly physical activity: ADL's  24-Hr Dietary Recall First Meal: jimmy dean english muffin snadwich Snack: carrots + ranch Second Meal: sandwich + chips Snack: 1/4 cup trialmix Third Meal: pizza + chicken (from [employer]) Snack:  Beverages: water, 16 oz 2% milk, gingerale, sweet tea  Estimated Energy Needs Calories: 1800  NUTRITION DIAGNOSIS  Overweight/obesity ([place]-3.3) related to past poor dietary habits and physical inactivity as evidenced by patient w/ planned sleeve gastrectomy surgery following dietary  guidelines for continued weight loss.   NUTRITION INTERVENTION  Nutrition counseling (C-1) and education (E-2) to facilitate bariatric surgery goals.  Pre-Op Goals Progress & New Goals -Avoid all carbonated beverages (ex: soda, sparkling beverages) -Practice CHEWING your food (aim for applesauce consistency) -Not drinking with meals  -continue: eat breakfast: like a protein shake + fruit  -continue: try bed yoga to get you up and moving  Continue: taking smaller bites continue: measure out 1/4 cup of nuts into a dish and eat out of that dish inclduing trailmix continue: leave a little meat and carb on the plate eat ALL of the non starchy vegetables  continue: try skyr Continue: decrease in between meal snacking by choosing dynamic snacks and seeking a hobby like cleaning out extra wood to burn NEW: increase stamina for house chores: keep up the squats + 1 house chore per day until completion NEW: eat at least one non starchy vegetable every day   Handouts Provided Include     Learning Style & Readiness for Change Teaching method utilized: Visual & Auditory  Demonstrated degree of understanding via: Teach Back  Readiness Level: Action Barriers to learning/adherence to lifestyle change: none identified   RD's Notes for next Visit  . Assess pts adherence to chosen goals   MONITORING & EVALUATION Dietary intake, weekly physical activity, body weight, and pre-op goals in 1 month.   Next Steps  Patient is to return to [place]  Return for next SWL

## 2020-12-19 ENCOUNTER — Other Ambulatory Visit: Payer: Self-pay

## 2020-12-19 ENCOUNTER — Encounter (HOSPITAL_COMMUNITY): Payer: Self-pay | Admitting: Gastroenterology

## 2020-12-21 ENCOUNTER — Other Ambulatory Visit (HOSPITAL_COMMUNITY)
Admission: RE | Admit: 2020-12-21 | Discharge: 2020-12-21 | Disposition: A | Discharge status: Home / Self Care | Payer: BC Managed Care – PPO | Source: Ambulatory Visit | Attending: Gastroenterology | Admitting: Gastroenterology

## 2020-12-21 DIAGNOSIS — Z01812 Encounter for preprocedural laboratory examination: Secondary | ICD-10-CM | POA: Diagnosis present

## 2020-12-21 DIAGNOSIS — Z20822 Contact with and (suspected) exposure to covid-19: Secondary | ICD-10-CM | POA: Diagnosis not present

## 2020-12-22 LAB — SARS CORONAVIRUS 2 (TAT 6-24 HRS): SARS Coronavirus 2: NEGATIVE

## 2020-12-23 NOTE — Anesthesia Preprocedure Evaluation (Addendum)
Anesthesia Evaluation  Patient identified by MRN, date of birth, ID band Patient awake    Reviewed: Allergy & Precautions, NPO status , Patient's Chart, lab work & pertinent test results  Airway Mallampati: III  TM Distance: >3 FB Neck ROM: Full  Mouth opening: Limited Mouth Opening  Dental no notable dental hx. (+) Teeth Intact, Dental Advisory Given   Pulmonary sleep apnea and Continuous Positive Airway Pressure Ventilation , former smoker,    Pulmonary exam normal breath sounds clear to auscultation       Cardiovascular hypertension, Pt. on medications and Pt. on home beta blockers Normal cardiovascular exam Rhythm:Regular Rate:Normal  TTE 2022 1. Left ventricular ejection fraction, by estimation, is 60 to 65%. The  left ventricle has normal function. The left ventricle has no regional  wall motion abnormalities. There is mild left ventricular hypertrophy.  Left ventricular diastolic parameters  were normal.  2. Right ventricular systolic function is normal. The right ventricular  size is normal.  3. The mitral valve is normal in structure. No evidence of mitral valve  regurgitation. No evidence of mitral stenosis.  4. The aortic valve has an indeterminant number of cusps. Aortic valve  regurgitation is not visualized. No aortic stenosis is present.  5. The inferior vena cava is normal in size with greater than 50%  respiratory variability, suggesting right atrial pressure of 3 mmHg.  Stress Test 2017 Conclusion: Exercise testing with gas exchange demonstrates mild functional impairment when compared to matches sedentary norms. There is no indication of cardiac limitation. With adjustment to patient's ideal weight and nearly achieved ventilatory limits, patient appears primarily limited due to his body habitus and restrictive lung physiology. Pre-exercise spirometry suggests mild restrictive physiology. Note there was mild  chronotropic incompetence.    Neuro/Psych PSYCHIATRIC DISORDERS Anxiety Depression negative neurological ROS     GI/Hepatic negative GI ROS, Neg liver ROS,   Endo/Other  Morbid obesity (BMI 62)  Renal/GU negative Renal ROS  negative genitourinary   Musculoskeletal  (+) Arthritis ,   Abdominal   Peds  Hematology negative hematology ROS (+)   Anesthesia Other Findings   Reproductive/Obstetrics                            Anesthesia Physical Anesthesia Plan  ASA: III  Anesthesia Plan: MAC   Post-op Pain Management:    Induction: Intravenous  PONV Risk Score and Plan: 1 and Propofol infusion and Treatment may vary due to age or medical condition  Airway Management Planned: Natural Airway  Additional Equipment:   Intra-op Plan:   Post-operative Plan:   Informed Consent: I have reviewed the patients History and Physical, chart, labs and discussed the procedure including the risks, benefits and alternatives for the proposed anesthesia with the patient or authorized representative who has indicated his/her understanding and acceptance.     Dental advisory given  Plan Discussed with: CRNA  Anesthesia Plan Comments:         Anesthesia Quick Evaluation

## 2020-12-24 ENCOUNTER — Ambulatory Visit (HOSPITAL_COMMUNITY): Payer: BC Managed Care – PPO | Admitting: Anesthesiology

## 2020-12-24 ENCOUNTER — Encounter (HOSPITAL_COMMUNITY)
Admission: RE | Disposition: A | Discharge status: Home / Self Care | Payer: Self-pay | Source: Ambulatory Visit | Attending: Gastroenterology

## 2020-12-24 ENCOUNTER — Ambulatory Visit (HOSPITAL_COMMUNITY)
Admission: RE | Admit: 2020-12-24 | Discharge: 2020-12-24 | Disposition: A | Discharge status: Home / Self Care | Payer: BC Managed Care – PPO | Source: Ambulatory Visit | Attending: Gastroenterology | Admitting: Gastroenterology

## 2020-12-24 ENCOUNTER — Other Ambulatory Visit: Payer: Self-pay

## 2020-12-24 ENCOUNTER — Encounter (HOSPITAL_COMMUNITY): Payer: Self-pay | Admitting: Gastroenterology

## 2020-12-24 DIAGNOSIS — K649 Unspecified hemorrhoids: Secondary | ICD-10-CM | POA: Diagnosis not present

## 2020-12-24 DIAGNOSIS — Z6841 Body Mass Index (BMI) 40.0 and over, adult: Secondary | ICD-10-CM | POA: Diagnosis not present

## 2020-12-24 DIAGNOSIS — K2950 Unspecified chronic gastritis without bleeding: Secondary | ICD-10-CM | POA: Insufficient documentation

## 2020-12-24 DIAGNOSIS — D124 Benign neoplasm of descending colon: Secondary | ICD-10-CM | POA: Diagnosis not present

## 2020-12-24 DIAGNOSIS — Z87891 Personal history of nicotine dependence: Secondary | ICD-10-CM | POA: Insufficient documentation

## 2020-12-24 DIAGNOSIS — K644 Residual hemorrhoidal skin tags: Secondary | ICD-10-CM | POA: Insufficient documentation

## 2020-12-24 DIAGNOSIS — Z01818 Encounter for other preprocedural examination: Secondary | ICD-10-CM | POA: Diagnosis not present

## 2020-12-24 DIAGNOSIS — K642 Third degree hemorrhoids: Secondary | ICD-10-CM | POA: Diagnosis not present

## 2020-12-24 DIAGNOSIS — Z1211 Encounter for screening for malignant neoplasm of colon: Secondary | ICD-10-CM | POA: Insufficient documentation

## 2020-12-24 DIAGNOSIS — K5730 Diverticulosis of large intestine without perforation or abscess without bleeding: Secondary | ICD-10-CM | POA: Diagnosis not present

## 2020-12-24 DIAGNOSIS — D123 Benign neoplasm of transverse colon: Secondary | ICD-10-CM | POA: Insufficient documentation

## 2020-12-24 DIAGNOSIS — R142 Eructation: Secondary | ICD-10-CM | POA: Diagnosis not present

## 2020-12-24 DIAGNOSIS — K635 Polyp of colon: Secondary | ICD-10-CM | POA: Diagnosis not present

## 2020-12-24 DIAGNOSIS — Z96643 Presence of artificial hip joint, bilateral: Secondary | ICD-10-CM | POA: Diagnosis not present

## 2020-12-24 DIAGNOSIS — K2270 Barrett's esophagus without dysplasia: Secondary | ICD-10-CM | POA: Insufficient documentation

## 2020-12-24 DIAGNOSIS — K3189 Other diseases of stomach and duodenum: Secondary | ICD-10-CM

## 2020-12-24 DIAGNOSIS — Z91048 Other nonmedicinal substance allergy status: Secondary | ICD-10-CM | POA: Diagnosis not present

## 2020-12-24 DIAGNOSIS — Z1212 Encounter for screening for malignant neoplasm of rectum: Secondary | ICD-10-CM

## 2020-12-24 HISTORY — PX: COLONOSCOPY WITH PROPOFOL: SHX5780

## 2020-12-24 HISTORY — PX: ESOPHAGOGASTRODUODENOSCOPY (EGD) WITH PROPOFOL: SHX5813

## 2020-12-24 HISTORY — PX: HEMOSTASIS CLIP PLACEMENT: SHX6857

## 2020-12-24 HISTORY — PX: POLYPECTOMY: SHX5525

## 2020-12-24 HISTORY — PX: BIOPSY: SHX5522

## 2020-12-24 SURGERY — COLONOSCOPY WITH PROPOFOL
Anesthesia: Monitor Anesthesia Care

## 2020-12-24 MED ORDER — PROPOFOL 10 MG/ML IV BOLUS
INTRAVENOUS | Status: DC | PRN
  Administered 2020-12-24: 30 mg via INTRAVENOUS

## 2020-12-24 MED ORDER — LACTATED RINGERS IV SOLN
INTRAVENOUS | Status: DC
  Administered 2020-12-24: 1000 mL via INTRAVENOUS

## 2020-12-24 MED ORDER — PROPOFOL 1000 MG/100ML IV EMUL
INTRAVENOUS | Status: AC
  Filled 2020-12-24: qty 100

## 2020-12-24 MED ORDER — SODIUM CHLORIDE 0.9 % IV SOLN: INTRAVENOUS | Status: DC

## 2020-12-24 MED ORDER — EPHEDRINE SULFATE-NACL 50-0.9 MG/10ML-% IV SOSY
PREFILLED_SYRINGE | INTRAVENOUS | Status: DC | PRN
  Administered 2020-12-24 (×3): 10 mg via INTRAVENOUS

## 2020-12-24 MED ORDER — PROPOFOL 10 MG/ML IV BOLUS
INTRAVENOUS | Status: AC
  Filled 2020-12-24: qty 20

## 2020-12-24 MED ORDER — PROPOFOL 500 MG/50ML IV EMUL
INTRAVENOUS | Status: DC | PRN
  Administered 2020-12-24: 125 ug/kg/min via INTRAVENOUS

## 2020-12-24 MED ORDER — LIDOCAINE 2% (20 MG/ML) 5 ML SYRINGE
INTRAMUSCULAR | Status: DC | PRN
  Administered 2020-12-24: 80 mg via INTRAVENOUS

## 2020-12-24 SURGICAL SUPPLY — 25 items

## 2020-12-24 NOTE — Op Note (Signed)
[facility] Patient Name: Marcus Hutchinson Procedure Date: 12/24/2020 MRN: [id number] Attending MD: Justice Britain , MD Date of Birth: 12-22-1960 CSN: [id number] Age: 59 Admit Type: Outpatient Procedure:                Colonoscopy Indications:              Screening for colorectal malignant neoplasm Providers:                Justice Britain, MD, Kary Kos RN, RN,                            Laverda Sorenson, Technician, [facility], CRNA Referring MD:             Glenda Chroman, Felicie Morn, Levin Erp Medicines:                Monitored Anesthesia Care Complications:            No immediate complications. Estimated Blood Loss:     Estimated blood loss was minimal. Procedure:                Pre-Anesthesia Assessment:                           - Prior to the procedure, a History and Physical                            was performed, and patient medications and                            allergies were reviewed. The patient's tolerance of                            previous anesthesia was also reviewed. The risks                            and benefits of the procedure and the sedation                            options and risks were discussed with the patient.                            All questions were answered, and informed consent                            was obtained. Prior Anticoagulants: The patient has                            taken no previous anticoagulant or antiplatelet                            agents except for aspirin. ASA Grade Assessment:  III - A patient with severe systemic disease. After                            reviewing the risks and benefits, the patient was                            deemed in satisfactory condition to undergo the                            procedure.                           After obtaining informed consent, the colonoscope                             was passed under direct vision. Throughout the                            procedure, the patient's blood pressure, pulse, and                            oxygen saturations were monitored continuously. The                            [id number]-HQ190L ([id number] Olympus colonoscope was                            introduced through the anus and advanced to the the                            cecum, identified by appendiceal orifice and                            ileocecal valve. The colonoscopy was somewhat                            difficult due to significant looping. Successful                            completion of the procedure was aided by changing                            the patient's position, using manual pressure,                            straightening and shortening the scope to obtain                            bowel loop reduction and using scope torsion. The                            patient tolerated the procedure. Scope In: 8:03:30 AM Scope Out: 8:40:20 AM Scope Withdrawal Time: 0 hours 26 minutes 33 seconds  Total Procedure Duration: 0 hours 36 minutes  50 seconds  Findings:      The digital rectal exam findings include hemorrhoids. Pertinent       negatives include no palpable rectal lesions.      The colon (entire examined portion) revealed grossly excessive looping.      A moderate amount of semi-liquid stool was found in the entire colon,       interfering with visualization. Lavage of the area was performed using       copious amounts, resulting in clearance with adequate visualization.      Four sessile polyps were found in the descending colon (2), transverse       colon (1) and ascending colon (1). The polyps were 4 to 10 mm in size.       These polyps were removed with a cold snare. Resection and retrieval       were complete. One of the polyp resection sites (in the DC) had       persistent oozing and to prevent bleeding after the polypectomy, two        hemostatic clips were successfully placed (MR conditional). There was no       bleeding at the end of the procedure.      Multiple small and large-mouthed diverticula were found in the entire       colon.      Normal mucosa was found in the entire colon otherwise.      Non-bleeding non-thrombosed external and internal hemorrhoids were found       during retroflexion, during perianal exam and during digital exam. The       hemorrhoids were Grade III (internal hemorrhoids that prolapse but       require manual reduction). Impression:               - Hemorrhoids found on digital rectal exam.                           - There was significant looping of the colon -                            requiring repositioning and pressure to help enter                            the cecum base.                           - Stool in the entire examined colon - lavaged with                            adequate visualization.                           - Four, 4 to 10 mm polyps in the descending colon,                            in the transverse colon and in the ascending colon,                            removed with a cold snare. Resected and retrieved.  1 of the polyps had persistent oozing in the DC, so                            clips (MR conditional) were placed to decrease risk                            of persistent oozing.                           - Diverticulosis in the entire examined colon.                           - Normal mucosa in the entire examined colon                            otherwise.                           - Non-bleeding non-thrombosed external and internal                            hemorrhoids. Moderate Sedation:      Not Applicable - Patient had care per Anesthesia. Recommendation:           - The patient will be observed post-procedure,                            until all discharge criteria are met.                           - Discharge  patient to home.                           - Patient has a contact number available for                            emergencies. The signs and symptoms of potential                            delayed complications were discussed with the                            patient. Return to normal activities tomorrow.                            Written discharge instructions were provided to the                            patient.                           - High fiber diet.                           - Use FiberCon 1-2 tablets PO daily.                           -  Minimize NSAID use as able to decrease risk of                            post-interventional bleeding.                           - Continue present medications.                           - Await pathology results.                           - Repeat colonoscopy in likely 3 years for                            surveillance based on pathology results.                           - The findings and recommendations were discussed                            with the patient.                           - The findings and recommendations were discussed                            with the patient's family. Procedure Code(s):        --- Professional ---                           [id number], Colonoscopy, flexible; with removal of                            tumor(s), polyp(s), or other lesion(s) by snare                            technique Diagnosis Code(s):        --- Professional ---                           Z12.11, Encounter for screening for malignant                            neoplasm of colon                           K64.2, Third degree hemorrhoids                           K63.5, Polyp of colon                           K57.30, Diverticulosis of large intestine without                            perforation or abscess without bleeding CPT copyright 2019 [employer]. All rights reserved. The codes documented in this report are  preliminary and upon coder review may  be revised to meet current compliance requirements. Justice Britain, MD 12/24/2020 9:01:16 AM Number of Addenda: 0

## 2020-12-24 NOTE — Transfer of Care (Signed)
Immediate Anesthesia Transfer of Care Note  Patient: Marcus Hutchinson  Procedure(s) Performed: COLONOSCOPY WITH PROPOFOL (N/A ) ESOPHAGOGASTRODUODENOSCOPY (EGD) WITH PROPOFOL (N/A ) BIOPSY POLYPECTOMY HEMOSTASIS CLIP PLACEMENT  Patient Location: PACU  Anesthesia Type:MAC  Level of Consciousness: awake, alert  and oriented  Airway & Oxygen Therapy: Patient Spontanous Breathing and Patient connected to face mask oxygen  Post-op Assessment: Report given to RN and Post -op Vital signs reviewed and stable  Post vital signs: Reviewed and stable  Last Vitals:  Vitals Value Taken Time  BP    Temp    Pulse    Resp    SpO2      Last Pain:  Vitals:   12/24/20 0635  TempSrc: Oral  PainSc: 0-No pain         Complications: No complications documented.

## 2020-12-24 NOTE — Anesthesia Postprocedure Evaluation (Signed)
Anesthesia Post Note  Patient: Marcus Hutchinson  Procedure(s) Performed: COLONOSCOPY WITH PROPOFOL (N/A ) ESOPHAGOGASTRODUODENOSCOPY (EGD) WITH PROPOFOL (N/A ) BIOPSY POLYPECTOMY HEMOSTASIS CLIP PLACEMENT     Patient location during evaluation: Endoscopy Anesthesia Type: MAC Level of consciousness: awake and alert Pain management: pain level controlled Vital Signs Assessment: post-procedure vital signs reviewed and stable Respiratory status: spontaneous breathing, nonlabored ventilation, respiratory function stable and patient connected to nasal cannula oxygen Cardiovascular status: blood pressure returned to baseline and stable Postop Assessment: no apparent nausea or vomiting Anesthetic complications: no   No complications documented.  Last Vitals:  Vitals:   12/24/20 0910 12/24/20 0917  BP: 116/62 128/68  Pulse: (!) 59 (!) 57  Resp: 19 20  Temp:    SpO2: 97% 97%    Last Pain:  Vitals:   12/24/20 0917  TempSrc:   PainSc: 0-No pain                 Kass Herberger L [place]

## 2020-12-24 NOTE — H&P (Signed)
GASTROENTEROLOGY PROCEDURE H&P NOTE   Primary Care Physician: Glenda Chroman, MD  HPI: Marcus Hutchinson is a 60 y.o. male who presents for EGD for evaluation of eructation and pre-gastric bypass evaluation as well as Colonoscopy for screening purposes.  Past Medical History:  Diagnosis Date  . Anxiety   . Arthritis    osteoarthritis, spinal stenosis  . Depression   . History of kidney stones    x 3 episodes- none in awhile.  [name] Hypertension   . Sleep apnea    cpap use pressure 17   Past Surgical History:  Procedure Laterality Date  . BACK SURGERY    . CARDIAC CATHETERIZATION     10 yrs ago-normal  . CHOLECYSTECTOMY     laparoscopic  . FINGER SURGERY Right    little finger tip reattachment(slight decrease sensation).  . HERNIA REPAIR Left    LIH(left inguinal Hernia) repair  . JOINT REPLACEMENT    . LUMBAR LAMINECTOMY Bilateral 07/12/2018   Lumbar four-five Laminotomy/Foraminotomy  . LUMBAR LAMINECTOMY/DECOMPRESSION MICRODISCECTOMY Bilateral 07/12/2018   Procedure: Bilateral Lumbar four-five Laminotomy/Foraminotomy;  Surgeon: Kristeen Miss, MD;  Location: [place];  Service: Neurosurgery;  Laterality: Bilateral;  . MOLE REMOVAL Left 07/01/2018   shoulder  . MYRINGOTOMY Right 10/2017  . SHOULDER ARTHROSCOPY DISTAL CLAVICLE EXCISION AND OPEN ROTATOR CUFF REPAIR Right    '09  . 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 TOTAL HIP ARTHROPLASTY Right 2011   Current Facility-Administered Medications  Medication Dose Route Frequency Provider Last Rate Last Admin  . 0.9 %  sodium chloride infusion   Intravenous Continuous Levin Erp, [place]      . lactated ringers infusion   Intravenous Continuous Mansouraty, Telford Nab., MD 10 mL/hr at 12/24/20 0716 1,000 mL at 12/24/20 0716   Facility-Administered Medications Ordered in  Other Encounters  Medication Dose Route Frequency Provider Last Rate Last Admin  . ondansetron (ZOFRAN) 4 mg in sodium chloride 0.9 % 50 mL IVPB  4 mg Intravenous Q6H PRN Kristeen Miss, MD       Allergies  Allergen Reactions  . Tape Other (See Comments)    Blisters skin- paper tape seems to be ok **ADHESIVE**   Family History  Problem Relation Age of Onset  . Arthritis Mother   . Diabetes Mother   . Heart disease Father        stent placement  . Heart attack Father   . Heart failure Maternal Grandfather   . Stroke Maternal Uncle   . Heart attack Paternal Uncle    Social History   Socioeconomic History  . Marital status: Married    Spouse name: Not on file  . Number of children: Not on file  . Years of education: Not on file  . Highest education level: Not on file  Occupational History  . Not on file  Tobacco Use  . Smoking status: Former Smoker    Packs/day: 0.70    Years: 5.00    Pack years: 3.50    Types: Cigarettes    Start date: 05/01/1979    Quit date: 03/29/1984    Years since quitting: 36.7  . Smokeless tobacco: Never Used  Substance and Sexual Activity  . Alcohol use: No    Alcohol/week: 0.0 standard drinks  . Drug use: No  . Sexual activity: Yes  Other Topics Concern  .  Not on file  Social History Narrative  . Not on file   Social Determinants of Health   Financial Resource Strain: Not on file  Food Insecurity: Not on file  Transportation Needs: Not on file  Physical Activity: Not on file  Stress: Not on file  Social Connections: Not on file  Intimate Partner Violence: Not on file    Physical Exam: Vital signs in last 24 hours: Temp:  [98.6 F (37 C)] 98.6 F (37 C) (05/16 0635) Pulse Rate:  [60] 60 (05/16 0635) Resp:  [20] 20 (05/16 0635) BP: (172)/(87) 172/87 (05/16 0635) SpO2:  [97 %] 97 % (05/16 0635) Weight:  [215.5 kg] 215.5 kg (05/16 0635)   GEN: NAD EYE: Sclerae anicteric ENT: MMM CV: Non-tachycardic GI: Soft, protuberant,  rounded, obese, NT NEURO:  Alert & Oriented x 3  Lab Results: No results for input(s): WBC, HGB, HCT, PLT in the last 72 hours. BMET No results for input(s): NA, K, CL, CO2, GLUCOSE, BUN, CREATININE, CALCIUM in the last 72 hours. LFT No results for input(s): PROT, ALBUMIN, AST, ALT, ALKPHOS, BILITOT, BILIDIR, IBILI in the last 72 hours. PT/INR No results for input(s): LABPROT, INR in the last 72 hours.   Impression / Plan: This is a 60 y.o.male who presents for EGD for evaluation of eructation and pre-gastric bypass evaluation as well as Colonoscopy for screening purposes.  The risks and benefits of endoscopic evaluation were discussed with the patient; these include but are not limited to the risk of perforation, infection, bleeding, missed lesions, lack of diagnosis, severe illness requiring hospitalization, as well as anesthesia and sedation related illnesses.  The patient is agreeable to proceed.    Justice Britain, MD [facility] Office # [phone]

## 2020-12-24 NOTE — Discharge Instructions (Signed)
YOU HAD AN ENDOSCOPIC PROCEDURE TODAY: Refer to the procedure report and other information in the discharge instructions given to you for any specific questions about what was found during the examination. If this information does not answer your questions, please call [place] office at [phone] to clarify.  ° °YOU SHOULD EXPECT: Some feelings of bloating in the abdomen. Passage of more gas than usual. Walking can help get rid of the air that was put into your GI tract during the procedure and reduce the bloating. If you had a lower endoscopy (such as a colonoscopy or flexible sigmoidoscopy) you may notice spotting of blood in your stool or on the toilet paper. Some abdominal soreness may be present for a day or two, also. ° °DIET: Your first meal following the procedure should be a light meal and then it is ok to progress to your normal diet. A half-sandwich or bowl of soup is an example of a good first meal. Heavy or fried foods are harder to digest and may make you feel nauseous or bloated. Drink plenty of fluids but you should avoid alcoholic beverages for 24 hours. If you had a esophageal dilation, please see attached instructions for diet.   ° °ACTIVITY: Your care partner should take you home directly after the procedure. You should plan to take it easy, moving slowly for the rest of the day. You can resume normal activity the day after the procedure however YOU SHOULD NOT DRIVE, use power tools, machinery or perform tasks that involve climbing or major physical exertion for 24 hours (because of the sedation medicines used during the test).  ° °SYMPTOMS TO REPORT IMMEDIATELY: °A gastroenterologist can be reached at any hour. Please call [phone]  for any of the following symptoms:  °Following lower endoscopy (colonoscopy, flexible sigmoidoscopy) °Excessive amounts of blood in the stool  °Significant tenderness, worsening of abdominal pains  °Swelling of the abdomen that is new, acute  °Fever of 100° or  higher  °Following upper endoscopy (EGD, EUS, ERCP, esophageal dilation) °Vomiting of blood or coffee ground material  °New, significant abdominal pain  °New, significant chest pain or pain under the shoulder blades  °Painful or persistently difficult swallowing  °New shortness of breath  °Black, tarry-looking or red, bloody stools ° °FOLLOW UP:  °If any biopsies were taken you will be contacted by phone or by letter within the next 1-3 weeks. Call [phone]  if you have not heard about the biopsies in 3 weeks.  °Please also call with any specific questions about appointments or follow up tests. ° °

## 2020-12-24 NOTE — Op Note (Signed)
[facility] Patient Name: Marcus Hutchinson Procedure Date: 12/24/2020 MRN: [id number] Attending MD: Justice Britain , MD Date of Birth: 10/22/1960 CSN: [id number] Age: 60 Admit Type: Outpatient Procedure:                Upper GI endoscopy Indications:              Preoperative assessment for bariatric surgery to                            treat morbid obesity, Eructation Providers:                Justice Britain, MD, Kary Kos RN, RN,                            Laverda Sorenson, Technician, [facility], CRNA Referring MD:             Glenda Chroman, Felicie Morn, Levin Erp Medicines:                Monitored Anesthesia Care Complications:            No immediate complications. Estimated Blood Loss:     Estimated blood loss was minimal. Procedure:                Pre-Anesthesia Assessment:                           - Prior to the procedure, a History and Physical                            was performed, and patient medications and                            allergies were reviewed. The patient's tolerance of                            previous anesthesia was also reviewed. The risks                            and benefits of the procedure and the sedation                            options and risks were discussed with the patient.                            All questions were answered, and informed consent                            was obtained. Prior Anticoagulants: The patient has                            taken no previous anticoagulant or antiplatelet  agents except for aspirin. ASA Grade Assessment:                            III - A patient with severe systemic disease. After                            reviewing the risks and benefits, the patient was                            deemed in satisfactory condition to undergo the                            procedure.                            After obtaining informed consent, the endoscope was                            passed under direct vision. Throughout the                            procedure, the patient's blood pressure, pulse, and                            oxygen saturations were monitored continuously. The                            GIF-H190 ([id number]) was introduced through the                            mouth, and advanced to the second part of duodenum.                            The upper GI endoscopy was accomplished without                            difficulty. The patient tolerated the procedure. Scope In: Scope Out: Findings:      No gross lesions were noted in the proximal esophagus and in the mid       esophagus.      Scattered islands of salmon-colored mucosa were present at 45-46 cm. No       other visible abnormalities were present. Mucosa was biopsied with a       cold forceps for histology.      The Z-line was irregular and was found 46 cm from the incisors.      Patchy mildly erythematous mucosa without bleeding was found in the       entire examined stomach.      No gross lesions were noted in the entire examined stomach. Biopsies       were taken with a cold forceps for histology and Helicobacter pylori       testing.      No gross lesions were noted in the duodenal bulb, in the first portion       of the duodenum and in the second portion of the duodenum. Biopsies for  histology were taken with a cold forceps for evaluation of celiac       disease. Impression:               - No gross lesions in esophagus proximally.                            Salmon-colored mucosal islands suggestive of                            Barrett's esophagus distally. Biopsied.                           - Z-line irregular, 46 cm from the incisors.                           - Erythematous mucosa in the stomach. No other                            gross lesions in the stomach. Biopsied.                            - No gross lesions in the duodenal bulb, in the                            first portion of the duodenum and in the second                            portion of the duodenum. Biopsied. Moderate Sedation:      Not Applicable - Patient had care per Anesthesia. Recommendation:           - Proceed to scheduled colonoscopy.                           - Observe patient's clinical course.                           - Await pathology results.                           - Will consider PPI therapy based on final                            pathology and definitely if evidence of Barrett's                            esophagus is found.                           - The findings and recommendations were discussed                            with the patient.                           - The findings and recommendations were discussed  with the patient's family. Procedure Code(s):        --- Professional ---                           [phone], Esophagogastroduodenoscopy, flexible,                            transoral; with biopsy, single or multiple Diagnosis Code(s):        --- Professional ---                           K22.8, Other specified diseases of esophagus                           K31.89, Other diseases of stomach and duodenum                           Z01.818, Encounter for other preprocedural                            examination                           E66.01, Morbid (severe) obesity due to excess                            calories                           R14.2, Eructation CPT copyright 2019 [employer]. All rights reserved. The codes documented in this report are preliminary and upon coder review may  be revised to meet current compliance requirements. Justice Britain, MD 12/24/2020 8:50:09 AM Number of Addenda: 0

## 2020-12-25 ENCOUNTER — Encounter (HOSPITAL_COMMUNITY): Payer: Self-pay | Admitting: Gastroenterology

## 2020-12-25 ENCOUNTER — Encounter: Payer: Self-pay | Admitting: Gastroenterology

## 2020-12-25 NOTE — Progress Notes (Signed)
Attempted, left voicemail/bt

## 2020-12-26 ENCOUNTER — Other Ambulatory Visit: Payer: Self-pay

## 2020-12-26 LAB — SURGICAL PATHOLOGY

## 2020-12-26 MED ORDER — AMOXICILL-CLARITHRO-LANSOPRAZ PO MISC: Freq: Two times a day (BID) | ORAL | 0 refills | Status: DC

## 2021-01-09 ENCOUNTER — Encounter: Payer: BC Managed Care – PPO | Attending: Surgery | Admitting: Skilled Nursing Facility1

## 2021-01-09 DIAGNOSIS — E669 Obesity, unspecified: Secondary | ICD-10-CM | POA: Diagnosis present

## 2021-01-09 NOTE — Progress Notes (Signed)
Supervised Weight Loss Visit Bariatric Nutrition Education  Planned Surgery: sleeve gastrectomy  Pt Expectation of Surgery/ Goals: to lose weight  5 out of 6 SWL Appointments   Appt conducted virtually: pt agrees to limitations of this visit type, pt indentified by name and DOB.   NUTRITION ASSESSMENT  Anthropometrics  Start weight at [facility]: 475.8 lbs (date: 08/14/2019)  Weight: virtual visit Height: 74 in BMI: 61.50 kg/m2     Clinical  Medical hx: depression, anxiety, sleep apnea, kidney stones Medications: see list Labs: Vitamin D, A1C 5.9, TSH 5.67, testosterone 89 Notable signs/symptoms: hip pain (both replaced), back pain, neck pain Any previous deficiencies? Vitamin D (14)   Lifestyle & Dietary Hx  Pt states he is taking antibiotic currently due to H pylori.  Pt states he has been working on snacking completely. Pt states he does not like non starchy vegetables except for carrots and green beans so has not incorporated more of those in.  Pt states he got the mail 3 days a week this past month.  Pt states he feels he is thinking more about making changes each day.   Estimated daily fluid intake: 64+ oz Supplements: vitamin D Current average weekly physical activity: ADL's  24-Hr Dietary Recall First Meal: jimmy dean english muffin sandwich or toast + butter  Snack: carrots + ranch Second Meal: sandwich + chips Snack: 1/4 cup trialmix Third Meal: pizza + chicken (from pizza [employer]) or 2 hamburger patties + potato salad Snack:  Beverages: water, 16 oz 2% milk, gingerale, sweet tea, soda with medication    Estimated Energy Needs Calories: 1800  NUTRITION DIAGNOSIS  Overweight/obesity ([place]-3.3) related to past poor dietary habits and physical inactivity as evidenced by patient w/ planned sleeve gastrectomy surgery following dietary guidelines for continued weight loss.   NUTRITION INTERVENTION  Nutrition counseling (C-1) and education (E-2) to facilitate bariatric  surgery goals.  Pre-Op Goals Progress & New Goals -Avoid all carbonated beverages (ex: soda, sparkling beverages) -Practice CHEWING your food (aim for applesauce consistency) -Not drinking with meals  -continue: eat breakfast: like a protein shake + fruit  -continue: try bed yoga to get you up and moving  Continue: taking smaller bites continue: measure out 1/4 cup of nuts into a dish and eat out of that dish inclduing trailmix continue: leave a little meat and carb on the plate eat ALL of the non starchy vegetables  continue: try skyr Continue: decrease in between meal snacking by choosing dynamic snacks and seeking a hobby like cleaning out extra wood to burn Continue: increase stamina for house chores: keep up the squats + 1 house chore per day until completion; get the mail every day continue: eat at least one non starchy vegetable every day  NEW: have more salad throughout the week: make a salad big enough to last you 3 days   Handouts Provided Include     Learning Style & Readiness for Change Teaching method utilized: Visual & Auditory  Demonstrated degree of understanding via: Teach Back  Readiness Level: pre contemplative  Barriers to learning/adherence to lifestyle change: none identified   RD's Notes for next Visit  . Assess pts adherence to chosen goals   MONITORING & EVALUATION Dietary intake, weekly physical activity, body weight, and pre-op goals in 1 month.   Next Steps  Patient is to return to [place]  Return for next SWL

## 2021-02-12 ENCOUNTER — Telehealth (INDEPENDENT_AMBULATORY_CARE_PROVIDER_SITE_OTHER): Payer: BC Managed Care – PPO | Admitting: Gastroenterology

## 2021-02-12 ENCOUNTER — Ambulatory Visit: Payer: Medicare Other | Admitting: Gastroenterology

## 2021-02-12 ENCOUNTER — Encounter: Payer: Self-pay | Admitting: Gastroenterology

## 2021-02-12 DIAGNOSIS — R933 Abnormal findings on diagnostic imaging of other parts of digestive tract: Secondary | ICD-10-CM | POA: Insufficient documentation

## 2021-02-12 DIAGNOSIS — R142 Eructation: Secondary | ICD-10-CM | POA: Insufficient documentation

## 2021-02-12 DIAGNOSIS — K2270 Barrett's esophagus without dysplasia: Secondary | ICD-10-CM

## 2021-02-12 DIAGNOSIS — Z86010 Personal history of colonic polyps: Secondary | ICD-10-CM | POA: Insufficient documentation

## 2021-02-12 DIAGNOSIS — A048 Other specified bacterial intestinal infections: Secondary | ICD-10-CM | POA: Diagnosis not present

## 2021-02-12 MED ORDER — OMEPRAZOLE 20 MG PO CPDR: 20.0000 mg | DELAYED_RELEASE_CAPSULE | Freq: Every day | ORAL | 1 refills | Status: DC

## 2021-02-12 NOTE — Progress Notes (Signed)
GASTROENTEROLOGY OUTPATIENT CLINIC VISIT   Primary Care Provider Glenda Chroman, MD [place] [address] [phone]  Referring Provider Glenda Chroman, MD [address] [place],  [place] [address] [phone]  Patient Profile: Marcus Hutchinson is a 60 y.o. malewith a pmh significant for morbid obesity, anxiety, depression, OSA, hypertension, arthritis, nephrolithiasis, status post cholecystectomy, diverticulosis, colon polyps (TAs), H. pylori gastritis (eradication pending), Barrett's esophagus.  The patient presents to the [facility] for an evaluation and management of problem(s) noted below:  Problem List 1. Helicobacter pylori infection   2. Barrett's esophagus without dysplasia   3. Hx of adenomatous colonic polyps   4. Eructation   5. Abnormal endoscopy of upper gastrointestinal tract   6. Abnormal colonoscopy    I connected with  Rudene Anda on 02/12/21. I verified that I was speaking with the correct person using two identifiers. Due to the COVID-19 Pandemic, this service was provided via telemedicine using audio/visual media.   The patient was located at home. The provider was located in the office. The patient did consent to this visit and is aware of charges through their insurance as well as the limitations of evaluation and management by telemedicine. Other persons participating in this telemedicine service were none. Time spent on visit was 25 minutes.  History of Present Illness Please see initial consultation note by PA [facility] for full details of HPI  Interval History Today, the patient has a scheduled follow-up.  We performed his upper and lower endoscopy in May.  We rediscussed the results of his EGD which showed evidence of Barrett's esophagus as well as H. pylori gastritis and his colonoscopy which showed evidence of tubular adenomas.  Overall, the patient is feeling well.  He has completed his antibiotic therapy for H. pylori  gastritis with Prevpac.  He is not on any current PPI therapy.  He is planning to follow-up with nutrition/dietary as well as psychiatry prior to potential gastric bypass (gastric sleeve) with Dr. Thermon Leyland.  He is not having any acid reflux symptoms.  Burping somewhat improved after H. pylori treatment.  Patient denies any dysphagia or odynophagia.  He wonders what the length of time will be in regards to PPI therapy since he is not having acid reflux symptoms.  GI Review of Systems Positive as above Negative for globus, nausea, vomiting, pain, change in bowel habits, melena, hematochezia  Review of Systems General: Denies fevers/chills/weight loss unintentionally HEENT: Denies oral lesions/sore throat Cardiovascular: Denies chest pain/palpitations Pulmonary: Denies shortness of breath/nocturnal cough Gastroenterological: See HPI Genitourinary: Denies darkened urine Hematological: Denies easy bruising/bleeding Dermatological: Denies jaundice Psychological: Mood is stable   Medications Current Outpatient Medications  Medication Sig Dispense Refill   omeprazole (PRILOSEC) 20 MG capsule Take 1 capsule (20 mg total) by mouth daily. 90 capsule 1   amoxicillin-clarithromycin-lansoprazole (PREVPAC) combo pack Take by mouth 2 (two) times daily. Follow package directions. 1 each 0   aspirin EC 81 MG tablet Take 81 mg by mouth daily.     buPROPion (WELLBUTRIN XL) 150 MG 24 hr tablet Take 150 mg by mouth 2 (two) times daily.  3   fluticasone (FLONASE) 50 MCG/ACT nasal spray Place 2 sprays into both nostrils daily as needed for allergies.     gabapentin (NEURONTIN) 300 MG capsule Take 300 mg by mouth 3 (three) times daily.     methocarbamol (ROBAXIN) 500 MG tablet Take 1 tablet by mouth 2 (two) times daily as needed for  muscle spasms.     metoprolol succinate (TOPROL-XL) 50 MG 24 hr tablet Take 50 mg by mouth daily.     naproxen sodium (ALEVE) 220 MG tablet Take 440 mg by mouth daily.       Propylene Glycol (SYSTANE COMPLETE) 0.6 % SOLN Place 1 drop into both eyes daily as needed (dry eyes).     Vitamin D, Ergocalciferol, (DRISDOL) 1.25 MG (50000 UNIT) CAPS capsule Take 50,000 Units by mouth 2 (two) times a week.     No current facility-administered medications for this visit.   Facility-Administered Medications Ordered in Other Visits  Medication Dose Route Frequency Provider Last Rate Last Admin   ondansetron (ZOFRAN) 4 mg in sodium chloride 0.9 % 50 mL IVPB  4 mg Intravenous Q6H PRN Kristeen Miss, MD        Allergies Allergies  Allergen Reactions   Tape Other (See Comments)    Blisters skin- paper tape seems to be ok **ADHESIVE**    Histories Past Medical History:  Diagnosis Date   Anxiety    Arthritis    osteoarthritis, spinal stenosis   Depression    History of kidney stones    x 3 episodes- none in awhile.   Hypertension    Sleep apnea    cpap use pressure 17   Past Surgical History:  Procedure Laterality Date   BACK SURGERY     BIOPSY  12/24/2020   Procedure: BIOPSY;  Surgeon: Rush Landmark Telford Nab., MD;  Location: [facility];  Service: Gastroenterology;;   CARDIAC CATHETERIZATION     10 yrs ago-normal   CHOLECYSTECTOMY     laparoscopic   COLONOSCOPY WITH PROPOFOL N/A 12/24/2020   Procedure: COLONOSCOPY WITH PROPOFOL;  Surgeon: Irving Copas., MD;  Location: [facility];  Service: Gastroenterology;  Laterality: N/A;   ESOPHAGOGASTRODUODENOSCOPY (EGD) WITH PROPOFOL N/A 12/24/2020   Procedure: ESOPHAGOGASTRODUODENOSCOPY (EGD) WITH PROPOFOL;  Surgeon: Rush Landmark Telford Nab., MD;  Location: [facility];  Service: Gastroenterology;  Laterality: N/A;   FINGER SURGERY Right    little finger tip reattachment(slight decrease sensation).   HEMOSTASIS CLIP PLACEMENT  12/24/2020   Procedure: HEMOSTASIS CLIP PLACEMENT;  Surgeon: Rush Landmark Telford Nab., MD;  Location: [facility];  Service: Gastroenterology;;   HERNIA REPAIR Left    LIH(left  inguinal Hernia) repair   JOINT REPLACEMENT     LUMBAR LAMINECTOMY Bilateral 07/12/2018   Lumbar four-five Laminotomy/Foraminotomy   LUMBAR LAMINECTOMY/DECOMPRESSION MICRODISCECTOMY Bilateral 07/12/2018   Procedure: Bilateral Lumbar four-five Laminotomy/Foraminotomy;  Surgeon: Kristeen Miss, MD;  Location: [facility];  Service: Neurosurgery;  Laterality: Bilateral;   MOLE REMOVAL Left 07/01/2018   shoulder   MYRINGOTOMY Right 10/2017   POLYPECTOMY  12/24/2020   Procedure: POLYPECTOMY;  Surgeon: Mansouraty, Telford Nab., MD;  Location: [facility];  Service: Gastroenterology;;   SHOULDER ARTHROSCOPY DISTAL CLAVICLE EXCISION AND OPEN ROTATOR CUFF REPAIR Right    '09   TOTAL HIP ARTHROPLASTY Left 11/07/2015   Procedure: LEFT TOTAL HIP ARTHROPLASTY POSTERIOR  APPROACH;  Surgeon: Gaynelle Arabian, MD;  Location: [facility];  Service: Orthopedics;  Laterality: Left;   TOTAL HIP ARTHROPLASTY Right 2011   Social History   Socioeconomic History   Marital status: Married    Spouse name: Not on file   Number of children: Not on file   Years of education: Not on file   Highest education level: Not on file  Occupational History   Not on file  Tobacco Use   Smoking status: Former    Packs/day: 0.70    Years:  5.00    Pack years: 3.50    Types: Cigarettes    Start date: 05/01/1979    Quit date: 03/29/1984    Years since quitting: 36.9   Smokeless tobacco: Never  Substance and Sexual Activity   Alcohol use: No    Alcohol/week: 0.0 standard drinks   Drug use: No   Sexual activity: Yes  Other Topics Concern   Not on file  Social History Narrative   Not on file   Social Determinants of Health   Financial Resource Strain: Not on file  Food Insecurity: Not on file  Transportation Needs: Not on file  Physical Activity: Not on file  Stress: Not on file  Social Connections: Not on file  Intimate Partner Violence: Not on file   Family History  Problem Relation Age of Onset   Arthritis Mother     Diabetes Mother    Heart disease Father        stent placement   Heart attack Father    Stroke Maternal Uncle    Heart attack Paternal Uncle    Heart failure Maternal Grandfather    Colon cancer Neg Hx    Esophageal cancer Neg Hx    Inflammatory bowel disease Neg Hx    Liver disease Neg Hx    Pancreatic cancer Neg Hx    Rectal cancer Neg Hx    Stomach cancer Neg Hx    I have reviewed his medical, social, and family history in detail and updated the electronic medical record as necessary.    PHYSICAL EXAMINATION  Telemedicine visit   REVIEW OF DATA  I reviewed the following data at the time of this encounter:  GI Procedures and Studies  May 2022 EGD - No gross lesions in esophagus proximally. Salmon-colored mucosal islands suggestive of Barrett's esophagus distally. Biopsied. - Z-line irregular, 46 cm from the incisors. - Erythematous mucosa in the stomach. No other gross lesions in the stomach. Biopsied. - No gross lesions in the duodenal bulb, in the first portion of the duodenum and in the second portion of the duodenum. Biopsied.  May 2022 colonoscopy - Hemorrhoids found on digital rectal exam. - There was significant looping of the colon - requiring repositioning and pressure to help enter the cecum base. - Stool in the entire examined colon - lavaged with adequate visualization. - Four, 4 to 10 mm polyps in the descending colon, in the transverse colon and in the ascending colon, removed with a cold snare. Resected and retrieved. 1 of the polyps had persistent oozing in the DC, so clips (MR conditional) were placed to decrease risk of persistent oozing. - Diverticulosis in the entire examined colon. - Normal mucosa in the entire examined colon otherwise. - Non-bleeding non-thrombosed external and internal hemorrhoids.  Pathology FINAL MICROSCOPIC DIAGNOSIS: He has some and A. DUODENUM, BIOPSY:  -  Benign duodenal mucosa  -  No acute inflammation, villous  blunting or increased intraepithelial  lymphocytes identified  B. GASTRIC, BIOPSY:  -  Chronic active gastritis  -  No intestinal metaplasia identified  -  See comment  C. ESOPHAGEAL, BIOPSY:  -  Squamous and glandular epithelium with acute and chronic inflammation  -  Intestinal metaplasia present  -  No dysplasia or malignancy identified  -  See comment  D. COLON, ASCENDING, TRANSVERSE, DESCENDING, POLYPECTOMY:  -  Multiple fragments of tubular adenoma(s)  -  No high-grade dysplasia or malignancy identified  COMMENT:  B.  H. pylori immunohistochemistry is pending and will  be reported in an  addendum.  ADDENDUM:  H. pylori immunohistochemistry is POSITIVE for microorganisms.   Laboratory Studies  Reviewed those in epic  Imaging Studies  No relevant studies to review   ASSESSMENT  Mr. Rodin is a 60 y.o. male with a pmh significant for morbid obesity, anxiety, depression, OSA, hypertension, arthritis, nephrolithiasis, status post cholecystectomy, diverticulosis, colon polyps (TAs), H. pylori gastritis (eradication pending), Barrett's esophagus.  The patient is seen today for evaluation and management of:  1. Helicobacter pylori infection   2. Barrett's esophagus without dysplasia   3. Hx of adenomatous colonic polyps   4. Eructation   5. Abnormal endoscopy of upper gastrointestinal tract   6. Abnormal colonoscopy    The patient is hemodynamically and clinically stable.  We are going to ensure that he has eradication of his H. pylori infection by pursuing a stool H. pylori antigen for which patient will come to [facility] to drop off a sample in the coming weeks.  He will then initiate once daily PPI therapy in an effort of trying to prevent progression of his Barrett's esophagus.  We will plan a repeat endoscopy in the course of the next 6 to 12 months for reconfirmation of Barrett's while on PPI therapy.  At which point we will proceed with surveillance intervals based on what  the final Barrett's diagnosis returns.  He will return for a colonoscopy for surveillance in 3 years.  I see no contraindication from a GI perspective for him to move forward with his gastric bypass interventions that are planned.  I did describe a potential increased chance of GERD symptoms occurring with a Roux-en-Y as well as a gastric sleeve but hopefully will be able to maintain things for him if he is able to work on weight loss.  The risks and benefits of endoscopic evaluation were discussed with the patient; these include but are not limited to the risk of perforation, infection, bleeding, missed lesions, lack of diagnosis, severe illness requiring hospitalization, as well as anesthesia and sedation related illnesses.  The patient and/or family is agreeable to proceed.  All patient questions were answered to the best of my ability, and the patient agrees to the aforementioned plan of action with follow-up as indicated.   PLAN  H. pylori stool antigen to be obtained May initiate PPI 20 mg omeprazole once daily thereafter Repeat EGD in the next 6 to 12 months to reconfirm Barrett's esophagus and then proceed with surveillance Repeat colonoscopy in 3 years for surveillance   Orders Placed This Encounter  Procedures   Helicobacter pylori special antigen    New Prescriptions   OMEPRAZOLE (PRILOSEC) 20 MG CAPSULE    Take 1 capsule (20 mg total) by mouth daily.   Modified Medications   No medications on file    Planned Follow Up No follow-ups on file.   Total Time in Face-to-Face and in Coordination of Care for patient including independent/personal interpretation/review of prior testing, medical history, examination, medication adjustment, communicating results with the patient directly, and documentation with the EHR is 25 minutes.   Justice Britain, MD [facility]oscopy Office # [phone]

## 2021-02-12 NOTE — Patient Instructions (Signed)
If you are age 62 or older, your body mass index should be between 23-30. Your There is no height or weight on file to calculate BMI. If this is out of the aforementioned range listed, please consider follow up with your Primary Care Provider.  If you are age 31 or younger, your body mass index should be between 19-25. Your There is no height or weight on file to calculate BMI. If this is out of the aformentioned range listed, please consider follow up with your Primary Care Provider.   __________________________________________________________  The [facility] providers would like to encourage you to use [facility] to communicate with providers for non-urgent requests or questions.  Due to long hold times on the telephone, sending your provider a message by [facility] may be a faster and more efficient way to get a response.  Please allow 48 business hours for a response.  Please remember that this is for non-urgent requests.   We have sent the following medications to your pharmacy for you to pick up at your convenience: Omeprazole- ( do not start your Omeprazole until after you have submitted stool study.)    Your provider has requested that you go to the basement level for lab work before leaving today. Press "B" on the elevator. The lab is located at the first door on the left as you exit the elevator.   You will be due for a recall colonoscopy in  May 2025. We will send you a reminder in the mail when it gets closer to that time.  You will be due for a recall EGD in 05/2021. We will send you a reminder in the mail when it gets closer to that time. Thank you for choosing me and [facility].  Dr. Rush Landmark

## 2021-02-13 ENCOUNTER — Ambulatory Visit: Payer: Medicare Other | Admitting: Skilled Nursing Facility1

## 2021-02-13 ENCOUNTER — Encounter: Payer: BC Managed Care – PPO | Attending: Surgery | Admitting: Skilled Nursing Facility1

## 2021-02-13 DIAGNOSIS — G4730 Sleep apnea, unspecified: Secondary | ICD-10-CM | POA: Diagnosis not present

## 2021-02-13 DIAGNOSIS — Z713 Dietary counseling and surveillance: Secondary | ICD-10-CM | POA: Insufficient documentation

## 2021-02-13 DIAGNOSIS — E669 Obesity, unspecified: Secondary | ICD-10-CM

## 2021-02-13 NOTE — Progress Notes (Signed)
Supervised Weight Loss Visit Bariatric Nutrition Education  Planned Surgery: sleeve gastrectomy  Pt Expectation of Surgery/ Goals: to lose weight  6 out of 6 SWL Appointments   Appt conducted virtually: pt agrees to limitations of this visit type, pt indentified by name and DOB.  Pt completed visits.   Pt has cleared nutrition requirements.    NUTRITION ASSESSMENT  Anthropometrics  Start weight at [facility]: 475.8 lbs (date: 08/14/2019)  Weight: virtual visit Height: 74 in BMI:  kg/m2     Clinical  Medical hx: depression, anxiety, sleep apnea, kidney stones Medications: see list Labs: Vitamin D, A1C 5.9, TSH 5.67, testosterone 89 Notable signs/symptoms: hip pain (both replaced), back pain, neck pain Any previous deficiencies? Vitamin D (14)   Lifestyle & Dietary Hx  Pt states he is doing a virtual visit due to being sick.   Pt states in the last 6 months he feels he has learned portion sizes and what foods fall into what category and how they affect his health.   Pt states he believes he will do well with: stating he is worried he will not be able to make the neccessary changes to eat healthily namely not eating throughout the day.   Estimated daily fluid intake: 64+ oz Supplements: vitamin D Current average weekly physical activity: ADL's  24-Hr Dietary Recall:continued First Meal: jimmy dean english muffin sandwich or toast + butter  Snack: carrots + ranch Second Meal: sandwich + chips Snack: 1/4 cup trialmix Third Meal: pizza + chicken (from [employer]) or 2 hamburger patties + potato salad Snack: banana popcicles  Beverages: water, 16 oz 2% milk, gingerale, sweet tea, soda with medication    Estimated Energy Needs Calories: 1800  NUTRITION DIAGNOSIS  Overweight/obesity ([place]-3.3) related to past poor dietary habits and physical inactivity as evidenced by patient w/ planned sleeve gastrectomy surgery following dietary guidelines for continued weight  loss.   NUTRITION INTERVENTION  Nutrition counseling (C-1) and education (E-2) to facilitate bariatric surgery goals. Education on Caloric Overcompensation  Decreased mobility and increased pneumonia risk   Pre-Op Goals Progress & New Goals -Avoid all carbonated beverages (ex: soda, sparkling beverages) -Practice CHEWING your food (aim for applesauce consistency) -Not drinking with meals  -continue: eat breakfast: like a protein shake + fruit  -continue: try bed yoga to get you up and moving  Continue: taking smaller bites continue: measure out 1/4 cup of nuts into a dish and eat out of that dish inclduing trailmix continue: leave a little meat and carb on the plate eat ALL of the non starchy vegetables  continue: try skyr Continue: decrease in between meal snacking by choosing dynamic snacks and seeking a hobby like cleaning out extra wood to burn Continue: increase stamina for house chores: keep up the squats + 1 house chore per day until completion; get the mail every day continue: eat at least one non starchy vegetable every day  Continue: have more salad throughout the week: make a salad big enough to last you 3 days NEW: eating balanced meals throughout the day to limit over consumption at the end of the day  NEW: increase your physical activity   Handouts Provided Include    Learning Style & Readiness for Change Teaching method utilized: Visual & Auditory  Demonstrated degree of understanding via: Teach Back  Readiness Level: pre contemplative  Barriers to learning/adherence to lifestyle change: none identified   RD's Notes for next Visit  Assess pts adherence to chosen goals   MONITORING &  EVALUATION Dietary intake, weekly physical activity, body weight, and pre-op goals  Next Steps  Patient is to return to [facility] for pre-op class  Pt has completed visits. No further supervised visits required

## 2021-02-22 ENCOUNTER — Ambulatory Visit (INDEPENDENT_AMBULATORY_CARE_PROVIDER_SITE_OTHER): Payer: BC Managed Care – PPO | Admitting: Psychology

## 2021-03-06 ENCOUNTER — Ambulatory Visit (INDEPENDENT_AMBULATORY_CARE_PROVIDER_SITE_OTHER): Payer: BC Managed Care – PPO | Admitting: Psychology

## 2021-03-06 DIAGNOSIS — F509 Eating disorder, unspecified: Secondary | ICD-10-CM | POA: Diagnosis not present

## 2021-03-25 ENCOUNTER — Other Ambulatory Visit: Payer: BC Managed Care – PPO

## 2021-03-25 ENCOUNTER — Encounter: Payer: BC Managed Care – PPO | Attending: Surgery | Admitting: Skilled Nursing Facility1

## 2021-03-25 ENCOUNTER — Other Ambulatory Visit: Payer: Self-pay

## 2021-03-25 DIAGNOSIS — Z6841 Body Mass Index (BMI) 40.0 and over, adult: Secondary | ICD-10-CM | POA: Diagnosis not present

## 2021-03-25 DIAGNOSIS — E669 Obesity, unspecified: Secondary | ICD-10-CM | POA: Diagnosis present

## 2021-03-25 DIAGNOSIS — A048 Other specified bacterial intestinal infections: Secondary | ICD-10-CM

## 2021-03-25 DIAGNOSIS — K2270 Barrett's esophagus without dysplasia: Secondary | ICD-10-CM

## 2021-03-25 DIAGNOSIS — Z86010 Personal history of colonic polyps: Secondary | ICD-10-CM

## 2021-03-26 LAB — HELICOBACTER PYLORI  SPECIAL ANTIGEN
MICRO NUMBER:: 12243266
SPECIMEN QUALITY: ADEQUATE

## 2021-03-26 NOTE — Progress Notes (Signed)
Pre-Operative Nutrition Class:    Patient was seen on 03/25/2021 for Pre-Operative Bariatric Surgery Education at [facility]    Surgery date:  Surgery type: sleeve Start weight at [facility]: 475.8 pounds Weight today: 476 pounds  Samples given per MNT protocol. Patient educated on appropriate usage: Bariatric Advantage Multivitamin Lot # [id number] Exp: 08/23  Procare Calcium  Lot # [id number] Exp: 03/23  Bariatric Advantage protein powder Lot # [id number] Exp: 10/23    The following the learning objectives were met by the patient during this course: Identify Pre-Op Dietary Goals and will begin 2 weeks pre-operatively Identify appropriate sources of fluids and proteins  State protein recommendations and appropriate sources pre and post-operatively Identify Post-Operative Dietary Goals and will follow for 2 weeks post-operatively Identify appropriate multivitamin and calcium sources Describe the need for physical activity post-operatively and will follow MD recommendations State when to call healthcare provider regarding medication questions or post-operative complications When having a diagnosis of diabetes understanding hypoglycemia symptoms and the inclusion of 1 complex carbohydrate per meal  Handouts given during class include: Pre-Op Bariatric Surgery Diet Handout Protein Shake Handout Post-Op Bariatric Surgery Nutrition Handout BELT Program Information Flyer Support Group Information Flyer [facility] Bariatric Supplements Price List  Follow-Up Plan: Patient will follow-up at [facility] 2 weeks post operatively for diet advancement per MD.

## 2021-04-22 ENCOUNTER — Ambulatory Visit: Payer: Self-pay | Admitting: Surgery

## 2021-04-22 NOTE — Patient Instructions (Signed)
DUE TO COVID-19 ONLY ONE VISITOR IS ALLOWED TO COME WITH YOU AND STAY IN THE WAITING ROOM ONLY DURING PRE OP AND PROCEDURE.   **NO VISITORS ARE ALLOWED IN THE SHORT STAY AREA OR RECOVERY ROOM!!**  IF YOU WILL BE ADMITTED INTO THE HOSPITAL YOU ARE ALLOWED ONLY TWO SUPPORT PEOPLE DURING VISITATION HOURS ONLY (10AM -8PM)   The support person(s) may change daily. The support person(s) must pass our screening, gel in and out, and wear a mask at all times, including in the patient's room. Patients must also wear a mask when staff or their support person are in the room.  No visitors under the age of 80. Any visitor under the age of 79 must be accompanied by an adult.    COVID SWAB TESTING MUST BE COMPLETED ON:  05/09/21 **MUST PRESENT COMPLETED FORM AT TESTING SITE**    [place] [place] (backside of the building) You are not required to quarantine, however you are required to wear a well-fitted mask when you are out and around people not in your household.  Hand Hygiene often Do NOT share personal items Notify your provider if you are in close contact with someone who has COVID or you develop fever 100.4 or greater, new onset of sneezing, cough, sore throat, shortness of breath or body aches.  [address], [address], must go inside of the hospital, NOT A DRIVE THRU!  (Must self quarantine after testing. Follow instructions on handout.)       Your procedure is scheduled on: 05/13/21   Report to [facility] Main  Entrance   Report to Short Stay at 5:15 AM   [facility])   Call this number if you have problems the morning of surgery [phone]   May have liquids until: 4:30 AM    day of surgery  CLEAR LIQUID DIET  Foods Allowed                                                                     Foods Excluded  Water, Black Coffee and tea, regular and decaf                             liquids that  you cannot  Plain Jell-O in any flavor  (No red)                                           see through such as: Fruit ices (not with fruit pulp)                                     milk, soups, orange juice              Iced Popsicles (No red)                                    All solid  food                                   Apple juices Sports drinks like Gatorade (No red) Lightly seasoned clear broth or consume(fat free) Sugar,   Sample Menu Breakfast                                Lunch                                     Supper Cranberry juice                    Beef broth                            Chicken broth Jell-O                                     Grape juice                           Apple juice Coffee or tea                        Jell-O                                      Popsicle                                                Coffee or tea                        Coffee or tea   MORNING OF SURGERY DRINK:  DRINK 1 G2 drink BEFORE YOU LEAVE HOME ( AT 4:30 AM), DRINK ALL OF THE  G2 DRINK AT ONE TIME.   NO SOLID FOOD AFTER 600 PM THE NIGHT BEFORE YOUR SURGERY. YOU MAY DRINK CLEAR FLUIDS. THE G2 DRINK YOU DRINK BEFORE YOU LEAVE HOME WILL BE THE LAST FLUIDS YOU DRINK BEFORE SURGERY.  PAIN IS EXPECTED AFTER SURGERY AND WILL NOT BE COMPLETELY ELIMINATED. AMBULATION AND TYLENOL WILL HELP REDUCE INCISIONAL AND GAS PAIN. MOVEMENT IS KEY!  YOU ARE EXPECTED TO BE OUT OF BED WITHIN 4 HOURS OF ADMISSION TO YOUR PATIENT ROOM.  SITTING IN THE RECLINER THROUGHOUT THE DAY IS IMPORTANT FOR DRINKING FLUIDS AND MOVING GAS THROUGHOUT THE GI TRACT.  COMPRESSION STOCKINGS SHOULD BE WORN [place] UNLESS YOU ARE WALKING.   INCENTIVE SPIROMETER SHOULD BE USED EVERY HOUR WHILE AWAKE TO DECREASE POST-OPERATIVE COMPLICATIONS SUCH AS PNEUMONIA.  WHEN DISCHARGED HOME, IT IS IMPORTANT TO CONTINUE TO WALK EVERY HOUR AND USE THE INCENTIVE SPIROMETER EVERY HOUR.     The day of  surgery:  Drink ONE (1) Pre-Surgery Clear Ensure or G2 by am the morning of surgery. Drink in one sitting. Do not sip.  This drink was  given to you during your hospital  pre-op appointment visit. Nothing else to drink after completing the  Pre-Surgery Clear Ensure or G2.          If you have questions, please contact your surgeon's office.     Oral Hygiene is also important to reduce your risk of infection.                                    Remember - BRUSH YOUR TEETH THE MORNING OF SURGERY WITH YOUR REGULAR TOOTHPASTE   Do NOT smoke after Midnight   Take these medicines the morning of surgery with A SIP OF WATER: gabapentin,bupropion,metoprolol,omeprazole.Use Flonase as usual.  DO NOT TAKE ANY ORAL DIABETIC MEDICATIONS DAY OF YOUR SURGERY                              You may not have any metal on your body including hair pins, jewelry, and body piercing             Do not wear lotions, powders, perfumes/cologne, or deodorant              Men may shave face and neck.   Do not bring valuables to the hospital. [place].   Contacts, dentures or bridgework may not be worn into surgery.   Bring small overnight bag day of surgery.    Patients discharged the day of surgery will not be allowed to drive home.   Special Instructions: Bring a copy of your healthcare power of attorney and living will documents         the day of surgery if you haven't scanned them in before.              Please read over the following fact sheets you were given: IF YOU HAVE QUESTIONS ABOUT YOUR PRE OP INSTRUCTIONS PLEASE CALL [phone]   [facility] - Preparing for Surgery Before surgery, you can play an important role.  Because skin is not sterile, your skin needs to be as free of germs as possible.  You can reduce the number of germs on your skin by washing with CHG (chlorahexidine gluconate) soap before surgery.  CHG is an antiseptic cleaner which  kills germs and bonds with the skin to continue killing germs even after washing. Please DO NOT use if you have an allergy to CHG or antibacterial soaps.  If your skin becomes reddened/irritated stop using the CHG and inform your nurse when you arrive at Short Stay. Do not shave (including legs and underarms) for at least 48 hours prior to the first CHG shower.  You may shave your face/neck. Please follow these instructions carefully:  1.  Shower with CHG Soap the night before surgery and the  morning of Surgery.  2.  If you choose to wash your hair, wash your hair first as usual with your  normal  shampoo.  3.  After you shampoo, rinse your hair and body thoroughly to remove the  shampoo.                           4.  Use CHG as you would any other liquid soap.  You can apply chg directly  to the skin and wash                       Gently with a scrungie or clean washcloth.  5.  Apply the CHG Soap to your body ONLY FROM THE NECK DOWN.   Do not use on face/ open                           Wound or open sores. Avoid contact with eyes, ears mouth and genitals (private parts).                       Wash face,  Genitals (private parts) with your normal soap.             6.  Wash thoroughly, paying special attention to the area where your surgery  will be performed.  7.  Thoroughly rinse your body with warm water from the neck down.  8.  DO NOT shower/wash with your normal soap after using and rinsing off  the CHG Soap.                9.  Pat yourself dry with a clean towel.            10.  Wear clean pajamas.            11.  Place clean sheets on your bed the night of your first shower and do not  sleep with pets. Day of Surgery : Do not apply any lotions/deodorants the morning of surgery.  Please wear clean clothes to the hospital/surgery center.  FAILURE TO FOLLOW THESE INSTRUCTIONS MAY RESULT IN THE CANCELLATION OF YOUR SURGERY PATIENT SIGNATURE_________________________________  NURSE  SIGNATURE__________________________________  ________________________________________________________________________

## 2021-04-22 NOTE — Progress Notes (Signed)
Sent message, via epic in basket, requesting orders in epic from surgeon.  

## 2021-04-23 ENCOUNTER — Encounter (HOSPITAL_COMMUNITY): Payer: Self-pay

## 2021-04-23 ENCOUNTER — Other Ambulatory Visit: Payer: Self-pay

## 2021-04-23 ENCOUNTER — Encounter (HOSPITAL_COMMUNITY)
Admission: RE | Admit: 2021-04-23 | Discharge: 2021-04-23 | Disposition: A | Discharge status: Home / Self Care | Payer: BC Managed Care – PPO | Source: Ambulatory Visit | Attending: Surgery | Admitting: Surgery

## 2021-04-23 DIAGNOSIS — Z01812 Encounter for preprocedural laboratory examination: Secondary | ICD-10-CM | POA: Diagnosis present

## 2021-04-23 LAB — CBC WITH DIFFERENTIAL/PLATELET
Abs Immature Granulocytes: 0.02 10*3/uL (ref 0.00–0.07)
Basophils Absolute: 0.1 10*3/uL (ref 0.0–0.1)
Basophils Relative: 1 %
Eosinophils Absolute: 0.2 10*3/uL (ref 0.0–0.5)
Eosinophils Relative: 3 %
HCT: 47.9 % (ref 39.0–52.0)
Hemoglobin: 15.3 g/dL (ref 13.0–17.0)
Immature Granulocytes: 0 %
Lymphocytes Relative: 18 %
Lymphs Abs: 1.2 10*3/uL (ref 0.7–4.0)
MCH: 28.1 pg (ref 26.0–34.0)
MCHC: 31.9 g/dL (ref 30.0–36.0)
MCV: 87.9 fL (ref 80.0–100.0)
Monocytes Absolute: 0.5 10*3/uL (ref 0.1–1.0)
Monocytes Relative: 8 %
Neutro Abs: 4.7 10*3/uL (ref 1.7–7.7)
Neutrophils Relative %: 70 %
Platelets: 210 10*3/uL (ref 150–400)
RBC: 5.45 MIL/uL (ref 4.22–5.81)
RDW: 15.1 % (ref 11.5–15.5)
WBC: 6.7 10*3/uL (ref 4.0–10.5)
nRBC: 0 % (ref 0.0–0.2)

## 2021-04-23 LAB — COMPREHENSIVE METABOLIC PANEL
ALT: 36 U/L (ref 0–44)
AST: 40 U/L (ref 15–41)
Albumin: 3.5 g/dL (ref 3.5–5.0)
Alkaline Phosphatase: 63 U/L (ref 38–126)
Anion gap: 9 (ref 5–15)
BUN: 20 mg/dL (ref 8–23)
CO2: 24 mmol/L (ref 22–32)
Calcium: 9.4 mg/dL (ref 8.9–10.3)
Chloride: 109 mmol/L (ref 98–111)
Creatinine, Ser: 1.29 mg/dL — ABNORMAL HIGH (ref 0.61–1.24)
GFR, Estimated: >60 mL/min (ref >60)
Glucose, Bld: 104 mg/dL — ABNORMAL HIGH (ref 70–99)
Potassium: 4.3 mmol/L (ref 3.5–5.1)
Sodium: 142 mmol/L (ref 135–145)
Total Bilirubin: 0.9 mg/dL (ref 0.3–1.2)
Total Protein: 7.1 g/dL (ref 6.5–8.1)

## 2021-04-23 NOTE — Progress Notes (Addendum)
COVID Vaccine Completed: NO Date COVID Vaccine completed: COVID vaccine manufacturer: [employer] Test: 05/09/21 PCP - Dr. Jerene Bears Cardiologist - Dr. Carlyle Dolly LOV: 09/13/20  Chest x-ray -  EKG - 07/23/20 Stress Test -  ECHO - 09/26/20 Cardiac Cath -  Pacemaker/ICD device last checked:  Sleep Study - Yes CPAP - Yes  Fasting Blood Sugar -  Checks Blood Sugar _____ times a day  Blood Thinner Instructions:Aspirin will be held 2 weeks before surgery. Aspirin Instructions: Last Dose:  Anesthesia review: Hx: HTN,OSA(CPAP  Patient denies shortness of breath, fever, cough and chest pain at PAT appointment   Patient verbalized understanding of instructions that were given to them at the PAT appointment. Patient was also instructed that they will need to review over the PAT instructions again at home before surgery.

## 2021-05-09 ENCOUNTER — Other Ambulatory Visit: Payer: Self-pay | Admitting: Surgery

## 2021-05-10 LAB — SARS CORONAVIRUS 2 (TAT 6-24 HRS): SARS Coronavirus 2: NEGATIVE

## 2021-05-13 ENCOUNTER — Inpatient Hospital Stay (HOSPITAL_COMMUNITY): Payer: BC Managed Care – PPO | Admitting: Certified Registered"

## 2021-05-13 ENCOUNTER — Encounter (HOSPITAL_COMMUNITY): Payer: Self-pay | Admitting: Surgery

## 2021-05-13 ENCOUNTER — Inpatient Hospital Stay (HOSPITAL_COMMUNITY)
Admission: RE | Admit: 2021-05-13 | Discharge: 2021-05-14 | DRG: 621 | Disposition: A | Discharge status: Home / Self Care | Payer: BC Managed Care – PPO | Source: Ambulatory Visit | Attending: Surgery | Admitting: Surgery

## 2021-05-13 ENCOUNTER — Other Ambulatory Visit: Payer: Self-pay

## 2021-05-13 ENCOUNTER — Encounter (HOSPITAL_COMMUNITY)
Admission: RE | Disposition: A | Discharge status: Home / Self Care | Payer: Self-pay | Source: Ambulatory Visit | Attending: Surgery

## 2021-05-13 DIAGNOSIS — Z20822 Contact with and (suspected) exposure to covid-19: Secondary | ICD-10-CM | POA: Diagnosis present

## 2021-05-13 DIAGNOSIS — Z87891 Personal history of nicotine dependence: Secondary | ICD-10-CM | POA: Diagnosis not present

## 2021-05-13 DIAGNOSIS — K219 Gastro-esophageal reflux disease without esophagitis: Secondary | ICD-10-CM | POA: Diagnosis present

## 2021-05-13 DIAGNOSIS — Z538 Procedure and treatment not carried out for other reasons: Secondary | ICD-10-CM | POA: Diagnosis not present

## 2021-05-13 DIAGNOSIS — I10 Essential (primary) hypertension: Secondary | ICD-10-CM | POA: Diagnosis present

## 2021-05-13 DIAGNOSIS — G4730 Sleep apnea, unspecified: Secondary | ICD-10-CM | POA: Diagnosis present

## 2021-05-13 DIAGNOSIS — M109 Gout, unspecified: Secondary | ICD-10-CM | POA: Diagnosis present

## 2021-05-13 DIAGNOSIS — Z6841 Body Mass Index (BMI) 40.0 and over, adult: Secondary | ICD-10-CM | POA: Diagnosis not present

## 2021-05-13 DIAGNOSIS — K2270 Barrett's esophagus without dysplasia: Secondary | ICD-10-CM | POA: Diagnosis present

## 2021-05-13 LAB — CBC
HCT: 43.8 % (ref 39.0–52.0)
Hemoglobin: 13.7 g/dL (ref 13.0–17.0)
MCH: 28.4 pg (ref 26.0–34.0)
MCHC: 31.3 g/dL (ref 30.0–36.0)
MCV: 90.7 fL (ref 80.0–100.0)
Platelets: 298 10*3/uL (ref 150–400)
RBC: 4.83 MIL/uL (ref 4.22–5.81)
RDW: 14.9 % (ref 11.5–15.5)
WBC: 11.3 10*3/uL — ABNORMAL HIGH (ref 4.0–10.5)
nRBC: 0 % (ref 0.0–0.2)

## 2021-05-13 LAB — CREATININE, SERUM
Creatinine, Ser: 1.93 mg/dL — ABNORMAL HIGH (ref 0.61–1.24)
GFR, Estimated: 39 mL/min — ABNORMAL LOW (ref >60)

## 2021-05-13 LAB — TYPE AND SCREEN
ABO/RH(D): B NEG
Antibody Screen: NEGATIVE

## 2021-05-13 SURGERY — CREATION, GASTRIC BYPASS, ROUX-EN-Y, ROBOT-ASSISTED
Anesthesia: General | Site: Abdomen

## 2021-05-13 MED ORDER — PROPOFOL 10 MG/ML IV BOLUS
INTRAVENOUS | Status: DC | PRN
  Administered 2021-05-13: 330 mg via INTRAVENOUS

## 2021-05-13 MED ORDER — MIDAZOLAM HCL 2 MG/2ML IJ SOLN
INTRAMUSCULAR | Status: AC
  Filled 2021-05-13: qty 2

## 2021-05-13 MED ORDER — SUGAMMADEX SODIUM 500 MG/5ML IV SOLN
INTRAVENOUS | Status: DC | PRN
  Administered 2021-05-13: 500 mg via INTRAVENOUS

## 2021-05-13 MED ORDER — SIMETHICONE 80 MG PO CHEW: 80.0000 mg | CHEWABLE_TABLET | Freq: Four times a day (QID) | ORAL | Status: DC | PRN

## 2021-05-13 MED ORDER — MIDAZOLAM HCL 2 MG/2ML IJ SOLN
INTRAMUSCULAR | Status: DC | PRN
  Administered 2021-05-13: 2 mg via INTRAVENOUS

## 2021-05-13 MED ORDER — CHLORHEXIDINE GLUCONATE CLOTH 2 % EX PADS: 6.0000 | MEDICATED_PAD | Freq: Once | CUTANEOUS | Status: AC

## 2021-05-13 MED ORDER — ORAL CARE MOUTH RINSE: 15.0000 mL | Freq: Once | OROMUCOSAL | Status: AC

## 2021-05-13 MED ORDER — DEXAMETHASONE SODIUM PHOSPHATE 10 MG/ML IJ SOLN
INTRAMUSCULAR | Status: DC | PRN
  Administered 2021-05-13: 10 mg via INTRAVENOUS

## 2021-05-13 MED ORDER — 0.9 % SODIUM CHLORIDE (POUR BTL) OPTIME
TOPICAL | Status: DC | PRN
  Administered 2021-05-13: 1000 mL

## 2021-05-13 MED ORDER — EPHEDRINE SULFATE-NACL 50-0.9 MG/10ML-% IV SOSY
PREFILLED_SYRINGE | INTRAVENOUS | Status: DC | PRN
  Administered 2021-05-13 (×2): 5 mg via INTRAVENOUS

## 2021-05-13 MED ORDER — SUCCINYLCHOLINE CHLORIDE 200 MG/10ML IV SOSY
PREFILLED_SYRINGE | INTRAVENOUS | Status: AC
  Filled 2021-05-13: qty 10

## 2021-05-13 MED ORDER — PROPOFOL 500 MG/50ML IV EMUL
INTRAVENOUS | Status: DC | PRN
Start: 2021-05-13 — End: 2021-05-13
  Administered 2021-05-13: 150 ug/kg/min via INTRAVENOUS

## 2021-05-13 MED ORDER — ROCURONIUM BROMIDE 10 MG/ML (PF) SYRINGE
PREFILLED_SYRINGE | INTRAVENOUS | Status: AC
  Filled 2021-05-13: qty 10

## 2021-05-13 MED ORDER — FENTANYL CITRATE (PF) 100 MCG/2ML IJ SOLN
INTRAMUSCULAR | Status: AC
  Filled 2021-05-13: qty 2

## 2021-05-13 MED ORDER — KETAMINE HCL 10 MG/ML IJ SOLN
INTRAMUSCULAR | Status: AC
  Filled 2021-05-13: qty 1

## 2021-05-13 MED ORDER — LACTATED RINGERS IR SOLN
Status: DC | PRN
  Administered 2021-05-13: 1000 mL

## 2021-05-13 MED ORDER — ENOXAPARIN SODIUM 30 MG/0.3ML IJ SOSY
30.0000 mg | PREFILLED_SYRINGE | Freq: Two times a day (BID) | INTRAMUSCULAR | Status: DC
  Administered 2021-05-13 – 2021-05-14 (×3): 30 mg via SUBCUTANEOUS
  Filled 2021-05-13 (×3): qty 0.3

## 2021-05-13 MED ORDER — SUCCINYLCHOLINE CHLORIDE 200 MG/10ML IV SOSY
PREFILLED_SYRINGE | INTRAVENOUS | Status: DC | PRN
  Administered 2021-05-13: 200 mg via INTRAVENOUS

## 2021-05-13 MED ORDER — GABAPENTIN 300 MG PO CAPS
300.0000 mg | ORAL_CAPSULE | Freq: Three times a day (TID) | ORAL | Status: DC
  Administered 2021-05-13 – 2021-05-14 (×4): 300 mg via ORAL
  Filled 2021-05-13 (×4): qty 1

## 2021-05-13 MED ORDER — VITAMIN D (ERGOCALCIFEROL) 1.25 MG (50000 UNIT) PO CAPS: 50000.0000 [IU] | ORAL_CAPSULE | ORAL | Status: DC

## 2021-05-13 MED ORDER — PANTOPRAZOLE SODIUM 40 MG PO TBEC
40.0000 mg | DELAYED_RELEASE_TABLET | Freq: Every day | ORAL | Status: DC
  Administered 2021-05-14: 40 mg via ORAL
  Filled 2021-05-13: qty 1

## 2021-05-13 MED ORDER — HYDROMORPHONE HCL 1 MG/ML IJ SOLN
0.2500 mg | INTRAMUSCULAR | Status: DC | PRN
  Administered 2021-05-13: 0.25 mg via INTRAVENOUS

## 2021-05-13 MED ORDER — APREPITANT 40 MG PO CAPS
40.0000 mg | ORAL_CAPSULE | ORAL | Status: AC
  Administered 2021-05-13: 40 mg via ORAL
  Filled 2021-05-13: qty 1

## 2021-05-13 MED ORDER — SUGAMMADEX SODIUM 500 MG/5ML IV SOLN
INTRAVENOUS | Status: AC
  Filled 2021-05-13: qty 5

## 2021-05-13 MED ORDER — BUPIVACAINE LIPOSOME 1.3 % IJ SUSP
INTRAMUSCULAR | Status: AC
  Filled 2021-05-13: qty 20

## 2021-05-13 MED ORDER — LACTATED RINGERS IV SOLN: INTRAVENOUS | Status: DC

## 2021-05-13 MED ORDER — METHOCARBAMOL 500 MG PO TABS: 500.0000 mg | ORAL_TABLET | Freq: Two times a day (BID) | ORAL | Status: DC | PRN

## 2021-05-13 MED ORDER — CHLORHEXIDINE GLUCONATE 0.12 % MT SOLN
15.0000 mL | Freq: Once | OROMUCOSAL | Status: AC
  Administered 2021-05-13: 15 mL via OROMUCOSAL

## 2021-05-13 MED ORDER — ENOXAPARIN SODIUM 40 MG/0.4ML IJ SOSY
40.0000 mg | PREFILLED_SYRINGE | INTRAMUSCULAR | Status: AC
  Administered 2021-05-13: 40 mg via SUBCUTANEOUS
  Filled 2021-05-13: qty 0.4

## 2021-05-13 MED ORDER — METOPROLOL SUCCINATE ER 50 MG PO TB24
50.0000 mg | ORAL_TABLET | Freq: Every day | ORAL | Status: DC
  Administered 2021-05-14: 50 mg via ORAL
  Filled 2021-05-13: qty 1

## 2021-05-13 MED ORDER — HYDROMORPHONE HCL 1 MG/ML IJ SOLN
INTRAMUSCULAR | Status: AC
  Administered 2021-05-13: 0.25 mg via INTRAVENOUS
  Filled 2021-05-13: qty 1

## 2021-05-13 MED ORDER — ENSURE MAX PROTEIN PO LIQD
2.0000 [oz_av] | ORAL | Status: DC
  Administered 2021-05-14 (×4): 2 [oz_av] via ORAL

## 2021-05-13 MED ORDER — ACETAMINOPHEN 160 MG/5ML PO SOLN: 1000.0000 mg | Freq: Three times a day (TID) | ORAL | Status: DC

## 2021-05-13 MED ORDER — PROPOFOL 10 MG/ML IV BOLUS
INTRAVENOUS | Status: AC
  Filled 2021-05-13: qty 40

## 2021-05-13 MED ORDER — ONDANSETRON HCL 4 MG/2ML IJ SOLN: 4.0000 mg | Freq: Once | INTRAMUSCULAR | Status: DC | PRN

## 2021-05-13 MED ORDER — ACETAMINOPHEN 500 MG PO TABS
1000.0000 mg | ORAL_TABLET | ORAL | Status: AC
  Administered 2021-05-13: 1000 mg via ORAL
  Filled 2021-05-13: qty 2

## 2021-05-13 MED ORDER — BUPIVACAINE-EPINEPHRINE 0.25% -1:200000 IJ SOLN
INTRAMUSCULAR | Status: DC | PRN
  Administered 2021-05-13: 30 mL

## 2021-05-13 MED ORDER — FENTANYL CITRATE (PF) 250 MCG/5ML IJ SOLN
INTRAMUSCULAR | Status: DC | PRN
  Administered 2021-05-13: 100 ug via INTRAVENOUS
  Administered 2021-05-13: 50 ug via INTRAVENOUS

## 2021-05-13 MED ORDER — ACETAMINOPHEN 500 MG PO TABS
1000.0000 mg | ORAL_TABLET | Freq: Three times a day (TID) | ORAL | Status: DC
  Administered 2021-05-13 – 2021-05-14 (×3): 1000 mg via ORAL
  Filled 2021-05-13 (×3): qty 2

## 2021-05-13 MED ORDER — MORPHINE SULFATE (PF) 2 MG/ML IV SOLN
1.0000 mg | INTRAVENOUS | Status: DC | PRN
  Administered 2021-05-14: 2 mg via INTRAVENOUS
  Filled 2021-05-13: qty 1

## 2021-05-13 MED ORDER — STERILE WATER FOR IRRIGATION IR SOLN
Status: DC | PRN
  Administered 2021-05-13: 1000 mL

## 2021-05-13 MED ORDER — BUPROPION HCL ER (XL) 150 MG PO TB24
150.0000 mg | ORAL_TABLET | Freq: Two times a day (BID) | ORAL | Status: DC
  Administered 2021-05-14: 150 mg via ORAL
  Filled 2021-05-13 (×2): qty 1

## 2021-05-13 MED ORDER — PANTOPRAZOLE SODIUM 40 MG IV SOLR: 40.0000 mg | Freq: Every day | INTRAVENOUS | Status: DC

## 2021-05-13 MED ORDER — GABAPENTIN 300 MG PO CAPS
300.0000 mg | ORAL_CAPSULE | ORAL | Status: DC
  Filled 2021-05-13: qty 1

## 2021-05-13 MED ORDER — ONDANSETRON HCL 4 MG/2ML IJ SOLN: 4.0000 mg | INTRAMUSCULAR | Status: DC | PRN

## 2021-05-13 MED ORDER — PHENYLEPHRINE HCL (PRESSORS) 10 MG/ML IV SOLN
INTRAVENOUS | Status: AC
  Filled 2021-05-13: qty 2

## 2021-05-13 MED ORDER — ROCURONIUM BROMIDE 10 MG/ML (PF) SYRINGE
PREFILLED_SYRINGE | INTRAVENOUS | Status: DC | PRN
  Administered 2021-05-13 (×2): 10 mg via INTRAVENOUS
  Administered 2021-05-13 (×2): 20 mg via INTRAVENOUS
  Administered 2021-05-13: 50 mg via INTRAVENOUS
  Administered 2021-05-13: 20 mg via INTRAVENOUS

## 2021-05-13 MED ORDER — LIDOCAINE HCL (PF) 2 % IJ SOLN
INTRAMUSCULAR | Status: AC
  Filled 2021-05-13: qty 25

## 2021-05-13 MED ORDER — ASPIRIN EC 81 MG PO TBEC
81.0000 mg | DELAYED_RELEASE_TABLET | Freq: Every day | ORAL | Status: DC
  Administered 2021-05-14: 81 mg via ORAL
  Filled 2021-05-13: qty 1

## 2021-05-13 MED ORDER — ONDANSETRON HCL 4 MG/2ML IJ SOLN
INTRAMUSCULAR | Status: DC | PRN
  Administered 2021-05-13: 4 mg via INTRAVENOUS

## 2021-05-13 MED ORDER — BUPIVACAINE-EPINEPHRINE (PF) 0.25% -1:200000 IJ SOLN
INTRAMUSCULAR | Status: AC
  Filled 2021-05-13: qty 30

## 2021-05-13 MED ORDER — LIDOCAINE 2% (20 MG/ML) 5 ML SYRINGE
INTRAMUSCULAR | Status: DC | PRN
  Administered 2021-05-13: 1.5 mg/kg/h via INTRAVENOUS
  Administered 2021-05-13: 100 mg via INTRAVENOUS

## 2021-05-13 MED ORDER — BUPIVACAINE LIPOSOME 1.3 % IJ SUSP: 20.0000 mL | Freq: Once | INTRAMUSCULAR | Status: AC

## 2021-05-13 MED ORDER — OXYCODONE HCL 5 MG/5ML PO SOLN
5.0000 mg | Freq: Four times a day (QID) | ORAL | Status: DC | PRN
  Filled 2021-05-13: qty 5

## 2021-05-13 MED ORDER — BUPIVACAINE LIPOSOME 1.3 % IJ SUSP
INTRAMUSCULAR | Status: DC | PRN
  Administered 2021-05-13: 20 mL

## 2021-05-13 MED ORDER — KETAMINE HCL 10 MG/ML IJ SOLN
INTRAMUSCULAR | Status: DC | PRN
  Administered 2021-05-13: 30 mg via INTRAVENOUS
  Administered 2021-05-13: 20 mg via INTRAVENOUS

## 2021-05-13 MED ORDER — PROPYLENE GLYCOL 0.6 % OP SOLN: 1.0000 [drp] | Freq: Every day | OPHTHALMIC | Status: DC | PRN

## 2021-05-13 MED ORDER — DEXAMETHASONE SODIUM PHOSPHATE 10 MG/ML IJ SOLN
INTRAMUSCULAR | Status: AC
  Filled 2021-05-13: qty 1

## 2021-05-13 MED ORDER — ENOXAPARIN (LOVENOX) PATIENT EDUCATION KIT
PACK | Freq: Once | Status: AC
  Filled 2021-05-13: qty 1

## 2021-05-13 MED ORDER — SODIUM CHLORIDE 0.9 % IV SOLN
2.0000 g | INTRAVENOUS | Status: AC
  Administered 2021-05-13: 2 g via INTRAVENOUS
  Filled 2021-05-13: qty 2

## 2021-05-13 MED ORDER — FLUTICASONE PROPIONATE 50 MCG/ACT NA SUSP: 2.0000 | Freq: Every day | NASAL | Status: DC | PRN

## 2021-05-13 SURGICAL SUPPLY — 80 items
ADH SKN CLS APL DERMABOND .7 (GAUZE/BANDAGES/DRESSINGS) ×1
APL PRP STRL LF DISP 70% ISPRP (MISCELLANEOUS) ×1
APPLIER CLIP 5 13 M/L LIGAMAX5 (MISCELLANEOUS)
APPLIER CLIP ROT 10 11.4 M/L (STAPLE)
APR CLP MED LRG 11.4X10 (STAPLE)
APR CLP MED LRG 5 ANG JAW (MISCELLANEOUS)
BLADE SURG SZ11 CARB STEEL (BLADE) ×2 IMPLANT
BRONCHOSCOPE PED SLIM DISP (MISCELLANEOUS) ×1 IMPLANT
CANNULA REDUC XI 12-8 STAPL (CANNULA) ×2
CANNULA REDUCER 12-8 DVNC XI (CANNULA) ×1 IMPLANT
CHLORAPREP W/TINT 26 (MISCELLANEOUS) ×2 IMPLANT
CLIP APPLIE 5 13 M/L LIGAMAX5 (MISCELLANEOUS) IMPLANT
CLIP APPLIE ROT 10 11.4 M/L (STAPLE) IMPLANT
COVER SURGICAL LIGHT HANDLE (MISCELLANEOUS) ×2 IMPLANT
COVER TIP SHEARS 8 DVNC (MISCELLANEOUS) ×1 IMPLANT
COVER TIP SHEARS 8MM DA VINCI (MISCELLANEOUS) ×2
DECANTER SPIKE VIAL GLASS SM (MISCELLANEOUS) ×2 IMPLANT
DERMABOND ADVANCED (GAUZE/BANDAGES/DRESSINGS) ×1
DERMABOND ADVANCED .7 DNX12 (GAUZE/BANDAGES/DRESSINGS) ×1 IMPLANT
DRAPE ARM DVNC X/XI (DISPOSABLE) ×4 IMPLANT
DRAPE COLUMN DVNC XI (DISPOSABLE) ×1 IMPLANT
DRAPE DA VINCI XI ARM (DISPOSABLE) ×8
DRAPE DA VINCI XI COLUMN (DISPOSABLE) ×2
ELECT REM PT RETURN 15FT ADLT (MISCELLANEOUS) ×2 IMPLANT
GAUZE 4X4 16PLY ~~LOC~~+RFID DBL (SPONGE) ×2 IMPLANT
GLOVE SURG ENC MOIS LTX SZ7.5 (GLOVE) ×4 IMPLANT
GLOVE SURG UNDER LTX SZ8 (GLOVE) ×4 IMPLANT
GOWN STRL REUS W/TWL XL LVL3 (GOWN DISPOSABLE) ×6 IMPLANT
GRASPER SUT TROCAR 14GX15 (MISCELLANEOUS) ×2 IMPLANT
IRRIG SUCT STRYKERFLOW 2 WTIP (MISCELLANEOUS) ×2
IRRIGATION SUCT STRKRFLW 2 WTP (MISCELLANEOUS) ×1 IMPLANT
KIT BASIN OR (CUSTOM PROCEDURE TRAY) ×2 IMPLANT
KIT GASTRIC LAVAGE 34FR ADT (SET/KITS/TRAYS/PACK) ×2 IMPLANT
LUBRICANT JELLY K Y 4OZ (MISCELLANEOUS) IMPLANT
MARKER SKIN DUAL TIP RULER LAB (MISCELLANEOUS) ×2 IMPLANT
MAT PREVALON FULL STRYKER (MISCELLANEOUS) ×2 IMPLANT
NDL SPNL 18GX3.5 QUINCKE PK (NEEDLE) ×1 IMPLANT
NEEDLE SPNL 18GX3.5 QUINCKE PK (NEEDLE) ×2 IMPLANT
OBTURATOR OPTICAL STANDARD 8MM (TROCAR) ×2
OBTURATOR OPTICAL STND 8 DVNC (TROCAR) ×1
OBTURATOR OPTICALSTD 8 DVNC (TROCAR) ×1 IMPLANT
PACK CARDIOVASCULAR III (CUSTOM PROCEDURE TRAY) ×2 IMPLANT
RELOAD STAPLE 60 2.5 WHT DVNC (STAPLE) ×3 IMPLANT
RELOAD STAPLE 60 3.5 BLU DVNC (STAPLE) IMPLANT
RELOAD STAPLER 2.5X60 WHT DVNC (STAPLE) ×9 IMPLANT
RELOAD STAPLER 3.5X60 BLU DVNC (STAPLE) ×1 IMPLANT
SCISSORS LAP 5X45 EPIX DISP (ENDOMECHANICALS) ×1 IMPLANT
SEAL CANN UNIV 5-8 DVNC XI (MISCELLANEOUS) ×3 IMPLANT
SEAL XI 5MM-8MM UNIVERSAL (MISCELLANEOUS) ×6
SEALER SYNCHRO 8 IS4000 DV (MISCELLANEOUS) ×2
SEALER SYNCHRO 8 IS4000 DVNC (MISCELLANEOUS) ×1 IMPLANT
SOL ANTI FOG 6CC (MISCELLANEOUS) ×1 IMPLANT
SOLUTION ANTI FOG 6CC (MISCELLANEOUS) ×1
SOLUTION ELECTROLUBE (MISCELLANEOUS) ×2 IMPLANT
STAPLER 60 DA VINCI SURE FORM (STAPLE) ×2
STAPLER 60 SUREFORM DVNC (STAPLE) ×1 IMPLANT
STAPLER CANNULA SEAL DVNC XI (STAPLE) ×1 IMPLANT
STAPLER CANNULA SEAL XI (STAPLE) ×2
STAPLER RELOAD 2.5X60 WHITE (STAPLE) ×18
STAPLER RELOAD 2.5X60 WHT DVNC (STAPLE) ×9
STAPLER RELOAD 3.5X60 BLU DVNC (STAPLE) ×1
STAPLER RELOAD 3.5X60 BLUE (STAPLE) ×2
SUT ETHIBOND 0 (SUTURE) ×3 IMPLANT
SUT MNCRL AB 4-0 PS2 18 (SUTURE) ×2 IMPLANT
SUT V-LOC BARB 180 2/0GR6 GS22 (SUTURE) ×6
SUT VIC AB 2-0 SH 27 (SUTURE)
SUT VIC AB 2-0 SH 27XBRD (SUTURE) IMPLANT
SUT VIC AB 3-0 SH 27 (SUTURE) ×2
SUT VIC AB 3-0 SH 27X BRD (SUTURE) IMPLANT
SUT VICRYL 0 TIES 12 18 (SUTURE) ×2 IMPLANT
SUT VLOC BARB 180 ABS3/0GR12 (SUTURE) ×4
SUTURE V-LC BRB 180 2/0GR6GS22 (SUTURE) ×2 IMPLANT
SUTURE VLOC BRB 180 ABS3/0GR12 (SUTURE) ×2 IMPLANT
SYR 20ML LL LF (SYRINGE) ×2 IMPLANT
TOWEL OR 17X26 10 PK STRL BLUE (TOWEL DISPOSABLE) ×2 IMPLANT
TRAY FOLEY MTR SLVR 16FR STAT (SET/KITS/TRAYS/PACK) IMPLANT
TROCAR ADV FIXATION 12X100MM (TROCAR) ×2 IMPLANT
TROCAR Z-THREAD FIOS 5X100MM (TROCAR) ×2 IMPLANT
TUBE CALIBRATION LAPBAND (TUBING) IMPLANT
TUBING INSUFFLATION 10FT LAP (TUBING) ×2 IMPLANT

## 2021-05-13 NOTE — Discharge Instructions (Signed)

## 2021-05-13 NOTE — Progress Notes (Signed)

## 2021-05-13 NOTE — H&P (Signed)
Admitting Physician: Stevens  Service: Bariatric surgery  CC: Morbid obesity  Subjective   HPI: Marcus Hutchinson is an 60 y.o. male who is here for elective robotic gastric bypass.  Past Medical History:  Diagnosis Date   Anxiety    Arthritis    osteoarthritis, spinal stenosis   Depression    History of kidney stones    x 3 episodes- none in awhile.   Hypertension    Sleep apnea    cpap use pressure 17    Past Surgical History:  Procedure Laterality Date   BACK SURGERY     BIOPSY  12/24/2020   Procedure: BIOPSY;  Surgeon: Rush Landmark Telford Nab., MD;  Location: [facility];  Service: Gastroenterology;;   CARDIAC CATHETERIZATION     10 yrs ago-normal   CHOLECYSTECTOMY     laparoscopic   COLONOSCOPY WITH PROPOFOL N/A 12/24/2020   Procedure: COLONOSCOPY WITH PROPOFOL;  Surgeon: Irving Copas., MD;  Location: [facility];  Service: Gastroenterology;  Laterality: N/A;   ESOPHAGOGASTRODUODENOSCOPY (EGD) WITH PROPOFOL N/A 12/24/2020   Procedure: ESOPHAGOGASTRODUODENOSCOPY (EGD) WITH PROPOFOL;  Surgeon: Rush Landmark Telford Nab., MD;  Location: [facility];  Service: Gastroenterology;  Laterality: N/A;   FINGER SURGERY Right    little finger tip reattachment(slight decrease sensation).   HEMOSTASIS CLIP PLACEMENT  12/24/2020   Procedure: HEMOSTASIS CLIP PLACEMENT;  Surgeon: Rush Landmark Telford Nab., MD;  Location: [facility];  Service: Gastroenterology;;   HERNIA REPAIR Left    LIH(left inguinal Hernia) repair   JOINT REPLACEMENT     LUMBAR LAMINECTOMY Bilateral 07/12/2018   Lumbar four-five Laminotomy/Foraminotomy   LUMBAR LAMINECTOMY/DECOMPRESSION MICRODISCECTOMY Bilateral 07/12/2018   Procedure: Bilateral Lumbar four-five Laminotomy/Foraminotomy;  Surgeon: Kristeen Miss, MD;  Location: [facility];  Service: Neurosurgery;  Laterality: Bilateral;   MOLE REMOVAL Left 07/01/2018   shoulder   MYRINGOTOMY Right 10/2017   POLYPECTOMY  12/24/2020    Procedure: POLYPECTOMY;  Surgeon: Mansouraty, Telford Nab., MD;  Location: [facility];  Service: Gastroenterology;;   SHOULDER ARTHROSCOPY DISTAL CLAVICLE EXCISION AND OPEN ROTATOR CUFF REPAIR Right    '09   TOTAL HIP ARTHROPLASTY Left 11/07/2015   Procedure: LEFT TOTAL HIP ARTHROPLASTY POSTERIOR  APPROACH;  Surgeon: Gaynelle Arabian, MD;  Location: [facility];  Service: Orthopedics;  Laterality: Left;   TOTAL HIP ARTHROPLASTY Right 2011   TOTAL HIP ARTHROPLASTY Bilateral     Family History  Problem Relation Age of Onset   Arthritis Mother    Diabetes Mother    Heart disease Father        stent placement   Heart attack Father    Stroke Maternal Uncle    Heart attack Paternal Uncle    Heart failure Maternal Grandfather    Colon cancer Neg Hx    Esophageal cancer Neg Hx    Inflammatory bowel disease Neg Hx    Liver disease Neg Hx    Pancreatic cancer Neg Hx    Rectal cancer Neg Hx    Stomach cancer Neg Hx     Social:  reports that he quit smoking about 37 years ago. His smoking use included cigarettes. He started smoking about 42 years ago. He has a 3.50 pack-year smoking history. He has never used smokeless tobacco. He reports that he does not drink alcohol and does not use drugs.  Allergies:  Allergies  Allergen Reactions   Tape Other (See Comments)    Blisters skin- paper tape seems to be ok **ADHESIVE**    Medications: Current Outpatient Medications  Medication Instructions   amoxicillin-clarithromycin-lansoprazole (PREVPAC) combo pack Oral, 2 times daily, Follow package directions.   aspirin EC 81 mg, Oral, Daily   buPROPion (WELLBUTRIN XL) 150 mg, Oral, 2 times daily   fluticasone (FLONASE) 50 MCG/ACT nasal spray 2 sprays, Each Nare, Daily PRN   gabapentin (NEURONTIN) 300 mg, Oral, 3 times daily   methocarbamol (ROBAXIN) 500 MG tablet 1 tablet, Oral, 2 times daily PRN   metoprolol succinate (TOPROL-XL) 50 mg, Oral, Daily   naproxen sodium (ALEVE) 440 mg, Oral, Daily    omeprazole (PRILOSEC) 20 mg, Oral, Daily   Propylene Glycol (SYSTANE COMPLETE) 0.6 % SOLN 1 drop, Both Eyes, Daily PRN   Vitamin D (Ergocalciferol) (DRISDOL) 50,000 Units, Oral, 2 times weekly    ROS - all of the below systems have been reviewed with the patient and positives are indicated with bold text General: chills, fever or night sweats Eyes: blurry vision or double vision ENT: epistaxis or sore throat Allergy/Immunology: itchy/watery eyes or nasal congestion Hematologic/Lymphatic: bleeding problems, blood clots or swollen lymph nodes Endocrine: temperature intolerance or unexpected weight changes Breast: new or changing breast lumps or nipple discharge Resp: cough, shortness of breath, or wheezing CV: chest pain or dyspnea on exertion GI: as per HPI GU: dysuria, trouble voiding, or hematuria MSK: joint pain or joint stiffness Neuro: TIA or stroke symptoms Derm: pruritus and skin lesion changes Psych: anxiety and depression  Objective   PE Blood pressure (!) 160/90, pulse 80, temperature 97.9 F (36.6 C), temperature source Oral, resp. rate (!) 22, height 6\' 1"  (1.854 m), weight (!) 205.2 kg, SpO2 96 %. Constitutional: NAD; conversant; no deformities Eyes: Moist conjunctiva; no lid lag; anicteric; PERRL Neck: Trachea midline; no thyromegaly Lungs: Normal respiratory effort; no tactile fremitus CV: RRR; no palpable thrills; no pitting edema GI: Abd Soft, non-tender; no palpable hepatosplenomegaly MSK: Normal range of motion of extremities; no clubbing/cyanosis Psychiatric: Appropriate affect; alert and oriented x3 Lymphatic: No palpable cervical or axillary lymphadenopathy  Results for orders placed or performed during the hospital encounter of 05/13/21 (from the past 24 hour(s))  Type and screen     Status: None (Preliminary result)   Collection Time: 05/13/21  6:39 AM  Result Value Ref Range   ABO/RH(D) PENDING    Antibody Screen PENDING    Sample Expiration       05/16/2021,2359 Performed at [facility] [place] [address], [place],  [address]     Imaging Orders  No imaging studies ordered today     Assessment and Plan   [name] is a 60 year old male with morbid obesity, BMI of 59, and obesity related comorbidities of hypertension and sleep apnea who presents for bariatric surgery evaluation. He initially was interested in a sleeve gastrectomy as it appeared to be a little less risk despite being a little less benefit. He underwent the following preoperative evaluations:  Lab work Nutrition Consult - completed with [employer] X-ray - completed 07/23/20 Cardiac Evaluation - Completed with Dr. Harl Bowie. Echo completed 09/26/20 Psychology evaluation - Completed with [facility] EGD consultation - Barrett's without dysplasia - GI recommends proceeding with surgery - H. Pylori which was proven to be eradicated on recent stool study  He presented today for a preoperative exam. We again reviewed the surgeries I offer and the risk, benefits, and alternatives. I expressed my concern of worsening his gastroesophageal reflux, and possibly worsening his Barrett's esophagus with a sleeve gastrectomy. There is a good chance his reflux improves  with a sleeve, however there is a small percentage of patients with sleeve gastrectomy who have worsening reflux despite weight loss. I recommended he consider a robotic gastric bypass as this would detach the majority of the acid producing cells from the esophagus as well as any bile reflux from the esophagus. I explained this using diagrams in detail with his wife present. We again reviewed the surgeries themselves as well as the risk, benefits, and alternatives. After full discussion all questions answered the patient agrees to proceed with a robotic gastric bypass with upper endoscopy. We will work to get him on the schedule for surgery.   Felicie Morn, MD  [facility] Use [email or web address] to contact on call provider

## 2021-05-13 NOTE — Progress Notes (Signed)
PHARMACY CONSULT FOR:  Risk Assessment for Post-Discharge VTE Following Bariatric Surgery  Post-Discharge VTE Risk Assessment: This patient's probability of 30-day post-discharge VTE is increased due to the factors marked:   Male   x Age >/=60 years   x BMI >/=50 kg/m2    CHF    Dyspnea at Rest    Paraplegia  x  Non-gastric-band surgery  x  Operation Time >/=3 hr    Return to OR     Length of Stay >/= 3 d   Hx of VTE   Hypercoagulable condition   Significant venous stasis    Predicted probability of 30-day post-discharge VTE: 1.55%  Other patient-specific factors to consider:   Recommendation for Discharge: Enoxaparin 60 mg [place] q12h x 4 weeks post-discharge  Marcus Hutchinson is a 60 y.o. male who underwent GASTRIC BYPASS ROUX-EN-Y WITH UPPER ENDO on 05/13/2021   Case start: 0802 Case end: 1114   Allergies  Allergen Reactions   Tape Other (See Comments)    Blisters skin- paper tape seems to be ok **ADHESIVE**    Patient Measurements: Height: 6\' 1"  (185.4 cm) Weight: (!) 205.2 kg (452 lb 6.4 oz) IBW/kg (Calculated) : 79.9 Body mass index is 59.69 kg/m.  No results for input(s): WBC, HGB, HCT, PLT, APTT, CREATININE, LABCREA, CREATININE, CREAT24HRUR, MG, PHOS, ALBUMIN, PROT, ALBUMIN, AST, ALT, ALKPHOS, BILITOT, BILIDIR, IBILI in the last 72 hours. Estimated Creatinine Clearance: 109.2 mL/min (A) (by C-G formula based on SCr of 1.29 mg/dL (H)).    Past Medical History:  Diagnosis Date   Anxiety    Arthritis    osteoarthritis, spinal stenosis   Depression    History of kidney stones    x 3 episodes- none in awhile.   Hypertension    Sleep apnea    cpap use pressure 17     Medications Prior to Admission  Medication Sig Dispense Refill Last Dose   buPROPion (WELLBUTRIN XL) 150 MG 24 hr tablet Take 150 mg by mouth 2 (two) times daily.  3 05/13/2021 at 0300   fluticasone (FLONASE) 50 MCG/ACT nasal spray Place 2 sprays into both nostrils daily as needed for  allergies.   05/13/2021 at 0300   gabapentin (NEURONTIN) 300 MG capsule Take 300 mg by mouth 3 (three) times daily.   05/13/2021 at 0300   metoprolol succinate (TOPROL-XL) 50 MG 24 hr tablet Take 50 mg by mouth daily.   05/13/2021 at 0300   naproxen sodium (ALEVE) 220 MG tablet Take 440 mg by mouth daily.    Past Month   omeprazole (PRILOSEC) 20 MG capsule Take 1 capsule (20 mg total) by mouth daily. 90 capsule 1 05/13/2021 at 0300   Propylene Glycol (SYSTANE COMPLETE) 0.6 % SOLN Place 1 drop into both eyes daily as needed (dry eyes).   05/12/2021   Vitamin D, Ergocalciferol, (DRISDOL) 1.25 MG (50000 UNIT) CAPS capsule Take 50,000 Units by mouth 2 (two) times a week.   Past Week   amoxicillin-clarithromycin-lansoprazole (PREVPAC) combo pack Take by mouth 2 (two) times daily. Follow package directions. (Patient not taking: No sig reported) 1 each 0 not taking   aspirin EC 81 MG tablet Take 81 mg by mouth daily.      methocarbamol (ROBAXIN) 500 MG tablet Take 1 tablet by mouth 2 (two) times daily as needed for muscle spasms.   More than a month       Marcus Hutchinson, PharmD, BCPS Pharmacy: [phone] 05/13/2021,2:05 PM

## 2021-05-13 NOTE — Plan of Care (Signed)
Patient ambulated to the room from stretcher. Patient very tired but able to demonstrate correct use of IS reaching up to 1750.

## 2021-05-13 NOTE — Op Note (Signed)
Patient: Marcus Hutchinson (09-Sep-1960, [id number])  Date of Surgery: 05/13/2021   Preoperative Diagnosis: MORBID OBESITY   Postoperative Diagnosis: MORBID OBESITY   Surgical Procedure: XI ROBOT ASSISTED GASTRIC BYPASS ROUX-EN-Y WITH UPPER ENDO:    Operative Team Members:  Surgeon(s) and Role:    * Marcus Hutchinson, Marcus Major, MD - Primary    * Marcus Riley, MD - Assisting   Anesthesiologist: Marcus Soman, MD CRNA: Marcus Cage, CRNA; Marcus Burow, CRNA   Anesthesia: General   Fluids:  Crystalloid  Complications: Operating Room Power Outage  Drains:  none   Specimen: none  Disposition:  PACU - hemodynamically stable.  Plan of Care: Admit to inpatient     Indications for Procedure:  Marcus Hutchinson is a 60 year old male with morbid obesity, BMI of 59, and obesity related comorbidities of hypertension and sleep apnea who presents for bariatric surgery evaluation. He initially was interested in a sleeve gastrectomy as it appeared to be a little less risk despite being a little less benefit. He underwent the following preoperative evaluations:  Lab work Nutrition Consult - completed with [employer] X-ray - completed 07/23/20 Cardiac Evaluation - Completed with Dr. Harl Hutchinson. Echo completed 09/26/20 Psychology evaluation - Completed with Marcus Hutchinson EGD consultation - Barrett's without dysplasia - GI recommends proceeding with surgery - H. Pylori which was proven to be eradicated on recent stool study  He presented today for a preoperative exam. We again reviewed the surgeries I offer and the risk, benefits, and alternatives. I expressed my concern of worsening his gastroesophageal reflux, and possibly worsening his Barrett's esophagus with a sleeve gastrectomy. There is a good chance his reflux improves with a sleeve, however there is a small percentage of patients with sleeve gastrectomy who have worsening reflux despite weight loss. I recommended he consider a  robotic gastric bypass as this would detach the majority of the acid producing cells from the esophagus as well as any bile reflux from the esophagus. I explained this using diagrams in detail with his wife present. We again reviewed the surgeries themselves as well as the risk, benefits, and alternatives. After full discussion all questions answered the patient agrees to proceed with a robotic gastric bypass with upper endoscopy. We will work to get him on the schedule for surgery.   Findings: Normal anatomy  Infection status: Patient: Private Patient Elective Case Case: Elective Infection Present At Time Of Surgery (PATOS): Some spillage of foregut  and jejunal contents while creating anastomoses   Description of Procedure:   On the date stated above, the patient was taken to the operating room suite and placed in supine positioning.  General endotracheal anesthesia was induced.  A timeout was completed verifying the correct patient, procedure, positioning and equipment needed for the case.  The patient's abdomen was prepped and draped in the usual sterile fashion.  A 5 mm trocar was inserted 22 cm down from the xiphoid and 4-6 cm to the left of midline using the optical technique.  There was no trauma to the underlying viscera with initial trocar placement.  Four robotic trocars were placed across the abdomen at this level.  The robotic stapling trocar was placed in the right most position.  The 5 mm trocar was replaced at this level with a 8 mm robotic trocar.  A 12 mm balloon assistant trocar was placed in the left lower quadrant tunneling through the rectus muscle.  The [facility] liver retractor was placed through the subxiphoid region and  under the left lobe of the liver and was connected to the rail of the bed.  The [place]ell was docked and we transitioned to robotic surgery.  Using the tip up grasper, fenestrated bipolar, 30 degree camera and Synchroseal from the patient's  right to left, we began by dissecting the angle of His off the left crus of the diaphragm.  The adhesions between the stomach, spleen and diaphragm were divided using the Synchroseal to define the angle of His.  I then started 4-6 cm down on the lesser curve of the stomach and created a defect in the gastrohepatic ligament tracking behind the lesser curve of the stomach to enter the lesser sac.  I then used multiple white loads of the robotic 60 mm Sureform linear stapler to form the gastric pouch.  I then directed my attention to the lower abdomen.  The omentum was divided with the Synchroseal and I identified the ligament of treitz.  The jejunum was run to a point 50 cm from the ligament of Treitz.  This loop of bowel was then brought into the left upper quadrant, over the transverse colon, between the split omentum.  A 2-0 v-loc suture was used to create the posterior outer row of the gastrojejunal anastomosis.  A window was made in the jejunal mesentery and the jejunum was divided just proximal to the gastrojejunal anastomosis using a white load of the robotic 60 mm Sureform linear stapler to divide the roux limb from the hepatobiliary limb.  I then continued to create the gastrojejunal anastomosis.  An approximately 2 cm gastrotomy was made in the pouch and a matching 2 cm enterotomy was created in the roux limb.  Then, 3-0 v-loc was used to create a posterior, inner, full thickness layer of the anastomosis.  A second 3-0 v-loc was used to create an anterior, inner, full thickness layer of the anastomosis.  While sewing the anterior inner layer, the Ewald tube was passed through the anastomosis to ensure patency.  The outer, anterior layer was then created using 2-0 v-loc suture.  At this point the gastrojejunal anastomosis was complete and the Ewald tube was removed.  The roux limb was then run 150 cm from the gastrojejunal anastomosis.  This loop of bowel was brought into the left upper quadrant for  anastomosis to the hepatobiliary limb.  A vicryl stay suture was placed from the antimesenteric border of the hepatobiliary limb to the roux limb.  Enterotomies were created in both limbs using the monopolar scissors.  A robotic 60 mm white load on the Sureform linear stapler was introduced into both limbs and fired to create the jejunojejunostomy.  A second white load on the Sureform 20mm linear stapler was fired from the common channel of the anastomosis to the distal roux limb creating a W- shaped anastomosis.  The common enterotomy of the anastomosis was closed using a white load of the Sureform 60mm linear stapler.  I began to place a 3-0 vicryl crotch stitch to reinforce the anastomosis between the hepatobiliary limb and the common channel.  Two throws were placed and I began sliding the knot down and the power cut off.  The backup generators failed.  I had no means for intra-abdominal visualization using the robot or laparoscope.  We obtained a battery powered bronchoscope.  We were able to visualize the suture, divide it with laparoscopic scissors and remove it with a laparoscopic needle driver.  We then undocked the robot and closed skin  using 4-0 monocryl and dermabond.  Local was injected in the subcutaneous space around the incisions.  The patient was awoken from anesthesia and transferred to the postoperative area.   Due to the power outage, I was unable to complete closure of the mesenteric defects, TAP block, and closure of the facial defect at the stapler incision in the right abdomen.  I will discuss with the patient and family how best to handle this situation.  He is at increased risk for internal hernia and port site hernia.  We will be vigilant to monitor for any issues and consider returning to the operating room to complete these steps either during this hospitalization or electively in the future.    Louanna Raw, MD General, Bariatric, & Minimally Invasive Surgery [facility], [place]

## 2021-05-13 NOTE — Transfer of Care (Signed)
Immediate Anesthesia Transfer of Care Note  Patient: Marcus Hutchinson  Procedure(s) Performed: XI ROBOT ASSISTED GASTRIC BYPASS ROUX-EN-Y WITH UPPER ENDO  Patient Location: PACU  Anesthesia Type:General  Level of Consciousness: awake, alert  and patient cooperative  Airway & Oxygen Therapy: Patient Spontanous Breathing and Patient connected to face mask oxygen  Post-op Assessment: Report given to RN and Post -op Vital signs reviewed and stable  Post vital signs: Reviewed and stable  Last Vitals:  Vitals Value Taken Time  BP 151/70 05/13/21 1133  Temp    Pulse 72 05/13/21 1139  Resp 19 05/13/21 1139  SpO2 99 % 05/13/21 1139  Vitals shown include unvalidated device data.  Last Pain:  Vitals:   05/13/21 0626  TempSrc:   PainSc: 5       Patients Stated Pain Goal: 4 (60/67/70 3403)  Complications: No notable events documented.

## 2021-05-13 NOTE — Anesthesia Procedure Notes (Signed)
Procedure Name: Intubation Date/Time: 05/13/2021 7:44 AM Performed by: Eben Burow, CRNA Pre-anesthesia Checklist: Patient identified, Emergency Drugs available, Suction available, Patient being monitored and Timeout performed Patient Re-evaluated:Patient Re-evaluated prior to induction Oxygen Delivery Method: Circle system utilized Preoxygenation: Pre-oxygenation with 100% oxygen Induction Type: IV induction Ventilation: Mask ventilation without difficulty, Oral airway inserted - appropriate to patient size and Two handed mask ventilation required Laryngoscope Size: 4 and Glidescope Tube type: Oral Number of attempts: 1 Airway Equipment and Method: Stylet Placement Confirmation: ETT inserted through vocal cords under direct vision, positive ETCO2 and breath sounds checked- equal and bilateral Secured at: 23 cm Tube secured with: Tape Dental Injury: Teeth and Oropharynx as per pre-operative assessment

## 2021-05-13 NOTE — Anesthesia Preprocedure Evaluation (Signed)
Anesthesia Evaluation  Patient identified by MRN, date of birth, ID band Patient awake    Reviewed: Allergy & Precautions, NPO status , Patient's Chart, lab work & pertinent test results  Airway Mallampati: II  TM Distance: <3 FB Neck ROM: Full    Dental no notable dental hx.    Pulmonary neg pulmonary ROS, former smoker,    Pulmonary exam normal breath sounds clear to auscultation + decreased breath sounds      Cardiovascular hypertension, negative cardio ROS Normal cardiovascular exam Rhythm:Regular Rate:Normal     Neuro/Psych negative neurological ROS  negative psych ROS   GI/Hepatic negative GI ROS, Neg liver ROS,   Endo/Other  Morbid obesity  Renal/GU negative Renal ROS  negative genitourinary   Musculoskeletal negative musculoskeletal ROS (+)   Abdominal (+) + obese,   Peds negative pediatric ROS (+)  Hematology negative hematology ROS (+)   Anesthesia Other Findings   Reproductive/Obstetrics negative OB ROS                             Anesthesia Physical Anesthesia Plan  ASA: 3  Anesthesia Plan: General   Post-op Pain Management:    Induction: Intravenous and Rapid sequence  PONV Risk Score and Plan: 2 and Ondansetron, Dexamethasone and Treatment may vary due to age or medical condition  Airway Management Planned: Oral ETT and Video Laryngoscope Planned  Additional Equipment:   Intra-op Plan:   Post-operative Plan: Extubation in OR  Informed Consent: I have reviewed the patients History and Physical, chart, labs and discussed the procedure including the risks, benefits and alternatives for the proposed anesthesia with the patient or authorized representative who has indicated his/her understanding and acceptance.     Dental advisory given  Plan Discussed with: CRNA and Surgeon  Anesthesia Plan Comments:         Anesthesia Quick Evaluation

## 2021-05-14 LAB — CBC WITH DIFFERENTIAL/PLATELET
Abs Immature Granulocytes: 0.06 10*3/uL (ref 0.00–0.07)
Basophils Absolute: 0 10*3/uL (ref 0.0–0.1)
Basophils Relative: 0 %
Eosinophils Absolute: 0 10*3/uL (ref 0.0–0.5)
Eosinophils Relative: 0 %
HCT: 38.4 % — ABNORMAL LOW (ref 39.0–52.0)
Hemoglobin: 12.5 g/dL — ABNORMAL LOW (ref 13.0–17.0)
Immature Granulocytes: 1 %
Lymphocytes Relative: 6 %
Lymphs Abs: 0.7 10*3/uL (ref 0.7–4.0)
MCH: 28.9 pg (ref 26.0–34.0)
MCHC: 32.6 g/dL (ref 30.0–36.0)
MCV: 88.9 fL (ref 80.0–100.0)
Monocytes Absolute: 0.8 10*3/uL (ref 0.1–1.0)
Monocytes Relative: 6 %
Neutro Abs: 11.3 10*3/uL — ABNORMAL HIGH (ref 1.7–7.7)
Neutrophils Relative %: 87 %
Platelets: 283 10*3/uL (ref 150–400)
RBC: 4.32 MIL/uL (ref 4.22–5.81)
RDW: 14.7 % (ref 11.5–15.5)
WBC: 12.9 10*3/uL — ABNORMAL HIGH (ref 4.0–10.5)
nRBC: 0 % (ref 0.0–0.2)

## 2021-05-14 MED ORDER — ONDANSETRON 4 MG PO TBDP: 4.0000 mg | ORAL_TABLET | Freq: Four times a day (QID) | ORAL | 0 refills | Status: DC | PRN

## 2021-05-14 MED ORDER — OXYCODONE HCL 5 MG PO TABS: 5.0000 mg | ORAL_TABLET | Freq: Four times a day (QID) | ORAL | 0 refills | Status: DC | PRN

## 2021-05-14 MED ORDER — ACETAMINOPHEN 500 MG PO TABS: 1000.0000 mg | ORAL_TABLET | Freq: Three times a day (TID) | ORAL | 0 refills | Status: AC

## 2021-05-14 MED ORDER — PANTOPRAZOLE SODIUM 40 MG PO TBEC: 40.0000 mg | DELAYED_RELEASE_TABLET | Freq: Every day | ORAL | 0 refills | Status: AC

## 2021-05-14 MED ORDER — ENOXAPARIN SODIUM 60 MG/0.6ML IJ SOSY: 1.0000 mg/kg | PREFILLED_SYRINGE | Freq: Two times a day (BID) | INTRAMUSCULAR | 0 refills | Status: DC

## 2021-05-14 NOTE — Progress Notes (Signed)
Progress Note: General Surgery Service   Chief Complaint/Subjective: Able to drink some liquids yesterday.  Gout pain improved.  Doing breathing exercise.  Objective: Vital signs in last 24 hours: Temp:  [97.7 F (36.5 C)-98.5 F (36.9 C)] 97.9 F (36.6 C) (10/04 0507) Pulse Rate:  [64-80] 80 (10/04 0507) Resp:  [13-28] 16 (10/04 0507) BP: (133-184)/(64-123) 133/84 (10/04 0507) SpO2:  [95 %-100 %] 97 % (10/04 0507)    Intake/Output from previous day: 10/03 0701 - 10/04 0700 In: 2896 [P.O.:240; I.V.:2556; IV Piggyback:100] Out: 277 [Urine:251; Stool:1; Blood:25] Intake/Output this shift: No intake/output data recorded.  GI: Abd Soft, tender around incisions, no bulge beneath incisions  Lab Results: CBC  Recent Labs    05/13/21 1547 05/14/21 0437  WBC 11.3* 12.9*  HGB 13.7 12.5*  HCT 43.8 38.4*  PLT 298 283   BMET Recent Labs    05/13/21 1547  CREATININE 1.93*   PT/INR No results for input(s): LABPROT, INR in the last 72 hours. ABG No results for input(s): PHART, HCO3 in the last 72 hours.  Invalid input(s): PCO2, PO2  Anti-infectives: Anti-infectives (From admission, onward)    Start     Dose/Rate Route Frequency Ordered Stop   05/13/21 0600  cefoTEtan (CEFOTAN) 2 g in sodium chloride 0.9 % 100 mL IVPB        2 g 200 mL/hr over 30 Minutes Intravenous On call to O.R. 05/13/21 0519 05/13/21 0746       Medications: Scheduled Meds:  acetaminophen  1,000 mg Oral Q8H   Or   acetaminophen (TYLENOL) oral liquid 160 mg/5 mL  1,000 mg Oral Q8H   aspirin EC  81 mg Oral Daily   buPROPion  150 mg Oral BID   enoxaparin (LOVENOX) injection  30 mg Subcutaneous Q12H   gabapentin  300 mg Oral TID   metoprolol succinate  50 mg Oral Daily   pantoprazole  40 mg Oral Daily   Ensure Max Protein  2 oz Oral Q2H   [START ON 05/16/2021] Vitamin D (Ergocalciferol)  50,000 Units Oral Once per day on Mon Thu   Continuous Infusions:  lactated ringers 75 mL/hr at 05/14/21  0513   PRN Meds:.fluticasone, methocarbamol, morphine injection, ondansetron (ZOFRAN) IV, oxyCODONE, simethicone  Assessment/Plan: Marcus Hutchinson is a 60 year old male s/p robotic roux-en-y gastric bypass on 05/13/21.  A power outage brought an early conclusion to the case so the internal hernia defects were not closed and the fascia of the 10mm trocar site in the right abdomen was not closed at the fascial level putting him at slightly increased risk for internal hernia and port site hernia compared to my usual technique.  This risk was discussed with the patient and his wife this morning.  I recommended observation and return to the OR if he were to develop symptoms of abdominal bulge or bowel obstruction.  It is difficult to predict his risk of internal hernia with the defect left open, but discussing with my mentors it is probably about 5 percent, and there are surgeons who routinely do not close these defects.  The plan at this point is to continue with the typical postoperative protocol.  Working on liquids, walking and breathing exercises.  Possible discharge later today vs. tomorrow.     Felicie Morn, MD  [facility] Use [email or web address] to contact on call provider

## 2021-05-14 NOTE — Progress Notes (Signed)
24hr fluid recall: 68mL.  Per dehydration protocol, will call pt to f/u within one week post op.

## 2021-05-14 NOTE — Progress Notes (Signed)
Patient alert and oriented, pain is controlled. Patient is tolerating fluids, advanced to protein shake today, patient is tolerating well. Reviewed Gastric Bypass discharge instructions with patient and patient is able to articulate understanding. Provided information on BELT program, Support Group and [facility] outpatient pharmacy. All questions answered, will continue to monitor.    

## 2021-05-14 NOTE — Discharge Summary (Signed)
Patient ID: Marcus Hutchinson [id number] 60 y.o. 1961/05/25  05/13/2021  Discharge date and time: 05/14/2021  Admitting Physician: [place]  Discharge Physician: Ashley  Admission Diagnoses: Morbid obesity ([place]) [E66.01] Patient Active Problem List   Diagnosis Date Noted   Morbid obesity ([place]) 05/13/2021   Eructation 02/12/2021   Hx of adenomatous colonic polyps 02/12/2021   Barrett's esophagus without dysplasia 78/24/2353   Helicobacter pylori infection 02/12/2021   Abnormal endoscopy of upper gastrointestinal tract 02/12/2021   Abnormal colonoscopy 02/12/2021   Lumbar stenosis with neurogenic claudication 07/12/2018   OA (osteoarthritis) of hip 11/07/2015     Discharge Diagnoses: Morbid Obesity Patient Active Problem List   Diagnosis Date Noted   Morbid obesity ([place]) 05/13/2021   Eructation 02/12/2021   Hx of adenomatous colonic polyps 02/12/2021   Barrett's esophagus without dysplasia 61/44/3154   Helicobacter pylori infection 02/12/2021   Abnormal endoscopy of upper gastrointestinal tract 02/12/2021   Abnormal colonoscopy 02/12/2021   Lumbar stenosis with neurogenic claudication 07/12/2018   OA (osteoarthritis) of hip 11/07/2015    Operations: Procedure(s): XI ROBOT ASSISTED GASTRIC BYPASS ROUX-EN-Y  Admission Condition: good  Discharged Condition: good  Indication for Admission: Morbid obesity  Hospital Course: Marcus Hutchinson presented for elective robotic roux-en-y gastric bypass with upper endoscopy.  The power in the OR failed during the operation so the procedure was aborted after 99% completed - the internal hernia defects were not closed, the fascia at the 12 port was not closed.  These are aspects of the procedure that some surgeons do not perform routinely.  He is at slightly increased risk of internal hernia or port site hernia.  I discussed this at length with the patient and his wife, he will call me with any concerning symptoms.   He recovered well in the hospital and was discharged.  Consults: None  Significant Diagnostic Studies: none  Treatments: surgery: as above  Disposition: Home  Patient Instructions:  Allergies as of 05/14/2021       Reactions   Tape Other (See Comments)   Blisters skin- paper tape seems to be ok **ADHESIVE**        Medication List     STOP taking these medications    naproxen sodium 220 MG tablet Commonly known as: ALEVE   traMADol 50 MG tablet Commonly known as: ULTRAM       TAKE these medications    acetaminophen 500 MG tablet Commonly known as: TYLENOL Take 2 tablets (1,000 mg total) by mouth every 8 (eight) hours for 5 days.   aspirin EC 81 MG tablet Take 81 mg by mouth daily.   buPROPion 150 MG 24 hr tablet Commonly known as: WELLBUTRIN XL Take 150 mg by mouth daily.   enoxaparin 60 MG/0.6ML injection Commonly known as: LOVENOX Inject 2.05 mLs (205 mg total) into the skin every 12 (twelve) hours for 28 days.   fluticasone 50 MCG/ACT nasal spray Commonly known as: FLONASE Place 2 sprays into both nostrils daily as needed for allergies.   gabapentin 300 MG capsule Commonly known as: NEURONTIN Take 300 mg by mouth 2 (two) times daily.   methocarbamol 500 MG tablet Commonly known as: ROBAXIN Take 1 tablet by mouth 2 (two) times daily as needed for muscle spasms.   metoprolol succinate 50 MG 24 hr tablet Commonly known as: TOPROL-XL Take 50 mg by mouth daily. Notes to patient: Monitor Blood Pressure Daily and keep a log for primary care physician.  You may need to  make changes to your medications with rapid weight loss.     omeprazole 20 MG capsule Commonly known as: PRILOSEC Take 1 capsule (20 mg total) by mouth daily.   ondansetron 4 MG disintegrating tablet Commonly known as: ZOFRAN-ODT Take 1 tablet (4 mg total) by mouth every 6 (six) hours as needed for nausea or vomiting.   oxyCODONE 5 MG immediate release tablet Commonly known as: Oxy  IR/ROXICODONE Take 1 tablet (5 mg total) by mouth every 6 (six) hours as needed for severe pain.   pantoprazole 40 MG tablet Commonly known as: PROTONIX Take 1 tablet (40 mg total) by mouth daily.   Systane Complete 0.6 % Soln Generic drug: Propylene Glycol Place 1 drop into both eyes daily as needed (dry eyes).   Vitamin D (Ergocalciferol) 1.25 MG (50000 UNIT) Caps capsule Commonly known as: DRISDOL Take 50,000 Units by mouth 2 (two) times a week. Mon, Thursday        Activity: no heavy lifting for 4 weeks Diet: regular diet Wound Care: keep wound clean and dry  Follow-up:  With Dr. Thermon Leyland per bariatric surgery follow up protocol.  Signed: [place], Bariatric, & Minimally Invasive Surgery [facility], [place]   05/14/2021, 12:11 PM

## 2021-05-14 NOTE — Progress Notes (Signed)
Patient given AVS and answered last minutes questions.  Called to verify with surgeon and pharmacy about Lovenox injection dosages after discharge.  Lovenox dosage clarified with patient and pharmacy for patient to give 60mg  injection twice a day for 28 days.  Will f/u with pt after discharge.

## 2021-05-14 NOTE — Plan of Care (Signed)
  Problem: Education: Goal: Ability to state signs and symptoms to report to health care provider will improve Outcome: Adequate for Discharge Goal: Knowledge of the prescribed self-care regimen will improve Outcome: Adequate for Discharge Goal: Knowledge of discharge needs will improve Outcome: Adequate for Discharge   Problem: Activity: Goal: Ability to tolerate increased activity will improve Outcome: Adequate for Discharge   Problem: Bowel/Gastric: Goal: Gastrointestinal status for postoperative course will improve Outcome: Adequate for Discharge Goal: Occurrences of nausea will decrease Outcome: Adequate for Discharge   Problem: Coping: Goal: Development of coping mechanisms to deal with changes in body function or appearance will improve Outcome: Adequate for Discharge   Problem: Fluid Volume: Goal: Maintenance of adequate hydration will improve Outcome: Adequate for Discharge   Problem: Nutritional: Goal: Nutritional status will improve Outcome: Adequate for Discharge   Problem: Clinical Measurements: Goal: Will show no signs or symptoms of venous thromboembolism Outcome: Adequate for Discharge Goal: Will remain free from infection Outcome: Adequate for Discharge Goal: Will show no signs of GI Leak Outcome: Adequate for Discharge   Problem: Respiratory: Goal: Will regain and/or maintain adequate ventilation Outcome: Adequate for Discharge   Problem: Pain Management: Goal: Pain level will decrease Outcome: Adequate for Discharge   Problem: Skin Integrity: Goal: Demonstration of wound healing without infection will improve Outcome: Adequate for Discharge

## 2021-05-15 NOTE — Anesthesia Postprocedure Evaluation (Signed)
Anesthesia Post Note  Patient: Marcus Hutchinson  Procedure(s) Performed: XI ROBOT ASSISTED GASTRIC BYPASS ROUX-EN-Y (Abdomen)     Patient location during evaluation: PACU Anesthesia Type: General Level of consciousness: awake and alert Pain management: pain level controlled Vital Signs Assessment: post-procedure vital signs reviewed and stable Respiratory status: spontaneous breathing, nonlabored ventilation, respiratory function stable and patient connected to nasal cannula oxygen Cardiovascular status: blood pressure returned to baseline and stable Postop Assessment: no apparent nausea or vomiting Anesthetic complications: no   No notable events documented.  Last Vitals:  Vitals:   05/14/21 1026 05/14/21 1352  BP: (!) 163/75 (!) 167/90  Pulse: 72 72  Resp: 19 18  Temp: 36.5 C 36.4 C  SpO2: 96% 96%    Last Pain:  Vitals:   05/14/21 0800  TempSrc:   PainSc: 3                  Ashaad Gaertner S

## 2021-05-16 ENCOUNTER — Telehealth (HOSPITAL_COMMUNITY): Payer: Self-pay | Admitting: *Deleted

## 2021-05-16 NOTE — Telephone Encounter (Signed)
1.  Tell me about your pain and pain management? Pt denies any acute pain. Pt c/o of a lot of indigestion.  Pt belched the entire time we were on the phone.  2.  Let's talk about fluid intake.  How much total fluid are you taking in? Pt states that he is getting in at least 64oz of fluid including protein shakes, bottled water, Powerade and broth.  3.  How much protein have you taken in the last 2 days? Pt states he is meeting his goal of 80g of protein each day with the protein shakes.  4.  Have you had nausea?  Tell me about when have experienced nausea and what you did to help? Pt denies nausea.   5.  Has the frequency or color changed with your urine? Pt states that he is urinating "fine" with no changes in frequency or urgency.     6.  Tell me what your incisions look like? "Incisions look fine" with the exception of 1 that has a bruise around it. Pt denies a fever, chills.  Pt states that incisions are not swollen, open, or draining.  Pt encouraged to call [facility] if symptoms worsen.   7.  Have you been passing gas? BM? Pt states that he is having BMs.  Pt's wife stated that pt had a bloody stool last night and spoke with Dr. Lanny Hurst.  Last BM this morning 05/16/21 which did not have blood in stool.   8.  If a problem or question were to arise who would you call?  Do you know contact numbers for [facility], [facility], and [facility]? Pt c/o "feeling tired".  Pt can describe s/sx of dehydration.  Pt knows to call [facility] for surgical, [facility] for nutrition, and [facility] for non-urgent questions or concerns. MD is aware.  Will continue to monitor.   9.  How has the walking going? Pt states he is walking around some but feels that his "gout is flaring back up" in his left foot.  Will continue to monitor.   10. Are you still using your incentive spirometer?  If so, how often? Pt states that he has not used the I.S. today. Pt encouraged to use incentive spirometer, at least 10x every hour while awake until he sees the  surgeon.  11.  How are your vitamins and calcium going?  How are you taking them? Pt states that he is taking his supplements and vitamins without difficulty.  Pt is taking Lovenox injections BID.  Pt was instructed to hold Lovenox on 05/15/21 pm for bloody stool and restart Lovenox on 05/17/21.  Pt to call [facility] if continued to have bloody stools or increased fatigue.  Reminded patient that the first 30 days post-operatively are important for successful recovery.  Practice good hand hygiene, wearing a mask when appropriate (since optional in most places), and minimizing exposure to people who live outside of the home, especially if they are exhibiting any respiratory, GI, or illness-like symptoms.

## 2021-05-22 ENCOUNTER — Telehealth (HOSPITAL_COMMUNITY): Payer: Self-pay | Admitting: *Deleted

## 2021-05-22 NOTE — Telephone Encounter (Signed)
Pt's wife called concerned about pt's flare up with gout and increased indigestion and "belching".  Wife states that pt has been able to consume approx 30 oz of water and a protein shake today.  Pt states that the protein shakes "feels thick and like it gets stuck and then comes back up".  Wife states that pt has not vomited, but has "spit up" protein shake.  Pt able to tolerate water.  Instructed pt and wife to try unflavored protein powder in liquid to meet goal.  Wife will go and purchase some and try it.  Pt also has virtual visit with PCP tomorrow morning for gout flare up.  Instructed pt and wife to call [facility] if symptoms not improved with recommendations.  Will continue to monitor and f/u.

## 2021-05-23 ENCOUNTER — Ambulatory Visit
Admission: RE | Admit: 2021-05-23 | Discharge: 2021-05-23 | Disposition: A | Discharge status: Home / Self Care | Payer: BC Managed Care – PPO | Source: Ambulatory Visit | Attending: Surgery | Admitting: Surgery

## 2021-05-23 ENCOUNTER — Other Ambulatory Visit: Payer: Self-pay | Admitting: Surgery

## 2021-05-23 ENCOUNTER — Other Ambulatory Visit: Payer: Self-pay

## 2021-05-23 DIAGNOSIS — Z01812 Encounter for preprocedural laboratory examination: Secondary | ICD-10-CM | POA: Diagnosis not present

## 2021-05-23 LAB — CBC WITH DIFFERENTIAL/PLATELET
Abs Immature Granulocytes: 0.06 10*3/uL (ref 0.00–0.07)
Basophils Absolute: 0 10*3/uL (ref 0.0–0.1)
Basophils Relative: 1 %
Eosinophils Absolute: 0.1 10*3/uL (ref 0.0–0.5)
Eosinophils Relative: 1 %
HCT: 38.5 % — ABNORMAL LOW (ref 39.0–52.0)
Hemoglobin: 12.3 g/dL — ABNORMAL LOW (ref 13.0–17.0)
Immature Granulocytes: 1 %
Lymphocytes Relative: 10 %
Lymphs Abs: 0.6 10*3/uL — ABNORMAL LOW (ref 0.7–4.0)
MCH: 28.3 pg (ref 26.0–34.0)
MCHC: 31.9 g/dL (ref 30.0–36.0)
MCV: 88.5 fL (ref 80.0–100.0)
Monocytes Absolute: 0.4 10*3/uL (ref 0.1–1.0)
Monocytes Relative: 7 %
Neutro Abs: 4.7 10*3/uL (ref 1.7–7.7)
Neutrophils Relative %: 80 %
Platelets: 322 10*3/uL (ref 150–400)
RBC: 4.35 MIL/uL (ref 4.22–5.81)
RDW: 14.4 % (ref 11.5–15.5)
WBC: 5.8 10*3/uL (ref 4.0–10.5)
nRBC: 0 % (ref 0.0–0.2)

## 2021-05-23 LAB — COMPREHENSIVE METABOLIC PANEL
ALT: 35 U/L (ref 0–44)
AST: 25 U/L (ref 15–41)
Albumin: 2.9 g/dL — ABNORMAL LOW (ref 3.5–5.0)
Alkaline Phosphatase: 44 U/L (ref 38–126)
Anion gap: 11 (ref 5–15)
BUN: 28 mg/dL — ABNORMAL HIGH (ref 8–23)
CO2: 24 mmol/L (ref 22–32)
Calcium: 8.4 mg/dL — ABNORMAL LOW (ref 8.9–10.3)
Chloride: 101 mmol/L (ref 98–111)
Creatinine, Ser: 1.6 mg/dL — ABNORMAL HIGH (ref 0.61–1.24)
GFR, Estimated: 48 mL/min — ABNORMAL LOW (ref >60)
Glucose, Bld: 103 mg/dL — ABNORMAL HIGH (ref 70–99)
Potassium: 3.8 mmol/L (ref 3.5–5.1)
Sodium: 136 mmol/L (ref 135–145)
Total Bilirubin: 0.9 mg/dL (ref 0.3–1.2)
Total Protein: 6.6 g/dL (ref 6.5–8.1)

## 2021-05-23 LAB — PHOSPHORUS: Phosphorus: 2.6 mg/dL (ref 2.5–4.6)

## 2021-05-23 LAB — MAGNESIUM: Magnesium: 1.7 mg/dL (ref 1.7–2.4)

## 2021-05-23 MED ORDER — THIAMINE HCL 100 MG/ML IJ SOLN
INTRAVENOUS | Status: DC
  Filled 2021-05-23 (×6): qty 1000

## 2021-05-23 MED ORDER — SODIUM CHLORIDE 0.9 % IV BOLUS
1000.0000 mL | Freq: Once | INTRAVENOUS | Status: AC
  Administered 2021-05-23: 1000 mL via INTRAVENOUS

## 2021-05-28 ENCOUNTER — Encounter: Payer: BC Managed Care – PPO | Attending: Surgery | Admitting: Skilled Nursing Facility1

## 2021-05-28 ENCOUNTER — Other Ambulatory Visit: Payer: Self-pay

## 2021-05-28 DIAGNOSIS — Z9884 Bariatric surgery status: Secondary | ICD-10-CM | POA: Insufficient documentation

## 2021-05-28 DIAGNOSIS — Z6841 Body Mass Index (BMI) 40.0 and over, adult: Secondary | ICD-10-CM | POA: Insufficient documentation

## 2021-05-28 NOTE — Progress Notes (Signed)
2 Week Post-Operative Nutrition Class   Patient was seen on 05/28/2021 for Post-Operative Nutrition education at [facility]    Surgery date: 05/13/2021 Surgery type: sleeve Start weight at [facility]: 475.8 pounds Weight today: 451.5 pounds Bowel Habits: Every day to every other day no complaints   Pt arrives in a wheel chair due to a painful gouty flare: Dietitian advseid pt to contact his PCP for medications that are not NSAIDs for the flare due to the risks of immobility immediatly after surgery    The following the learning objectives were met by the patient during this course: Identifies Phase 3 (Soft, High Proteins) Dietary Goals and will begin from 2 weeks post-operatively to 2 months post-operatively Identifies appropriate sources of fluids and proteins  Identifies appropriate fat sources and healthy verses unhealthy fat types   States protein recommendations and appropriate sources post-operatively Identifies the need for appropriate texture modifications, mastication, and bite sizes when consuming solids Identifies appropriate fat consumption and sources Identifies appropriate multivitamin and calcium sources post-operatively Describes the need for physical activity post-operatively and will follow MD recommendations States when to call healthcare provider regarding medication questions or post-operative complications   Handouts given during class include: Phase 3A: Soft, High Protein Diet Handout Phase 3 High Protein Meals Healthy Fats   Follow-Up Plan: Patient will follow-up at [facility] in 6 weeks for 2 month post-op nutrition visit for diet advancement per MD.  

## 2021-06-03 ENCOUNTER — Telehealth: Payer: Self-pay | Admitting: Skilled Nursing Facility1

## 2021-06-03 NOTE — Telephone Encounter (Signed)
RD called pt to verify fluid intake once starting soft, solid proteins 2 week post-bariatric surgery.   Daily Fluid intake: 64 oz Daily Protein intake: 80 g Bowel Habits: states he has a Hemorid which is pretty bad but no issues otherwise   Concerns/issues:   None stated.

## 2021-06-19 ENCOUNTER — Telehealth: Payer: Self-pay | Admitting: Skilled Nursing Facility1

## 2021-06-19 NOTE — Telephone Encounter (Signed)
Pt had some supplement questions.   Dietitian sent pt the link for the multivitamin.

## 2021-07-01 ENCOUNTER — Encounter: Payer: BC Managed Care – PPO | Attending: Surgery | Admitting: Skilled Nursing Facility1

## 2021-07-01 ENCOUNTER — Other Ambulatory Visit: Payer: Self-pay

## 2021-07-01 NOTE — Progress Notes (Signed)
Bariatric Nutrition Follow-Up Visit Medical Nutrition Therapy   NUTRITION ASSESSMENT    Surgery date: 05/13/2021 Surgery type: sleeve Start weight at [facility]: 475.8 pounds Weight today: 414.7 pounds  Clinical  Medical hx: depression, anxiety, sleep apnea, kidney stones Medications: see list Labs: Vitamin D, A1C 5.9, TSH 5.67, testosterone 89 Notable signs/symptoms: hip pain (both replaced), back pain, neck pain Any previous deficiencies? Vitamin D (14)   Lifestyle & Dietary Hx  Pt states he is currently taking medication for Gout.  Pt states his balance has been off. Pt states he bought an exercise bike recently. Pt states he has been feeling he has weak legs and short of breath.   Pt states he is having trouble with some textures just being gross not from a functional issue   Hot dogs are okay, tuna salad is okay, yogurt is heavy in the mouth (not stomach) and beef was weird in his mouth, beans are okay, cheese is okay, chicken is okay Pt states he experiences a lot of gas. Pt states he does not eat solid foods stating he does not want them, he does not want solid foods.   Out of breath: since before surgery; no change weak legs: since before surgery; no change Off balance: since before surgery; no change   Estimated daily fluid intake:  oz Estimated daily protein intake: 80 g Supplements: multi and calcium  Current average weekly physical activity: 5-6 days a week 5 minutes   24-Hr Dietary Recall First Meal: protein shake Snack:   Second Meal: protein shake Snack:   Third Meal: protein shake Snack:  Beverages: powerade shake + protein powder, water  Post-Op Goals/ Signs/ Symptoms Using straws: no Drinking while eating: no Chewing/swallowing difficulties: no Changes in vision: no Changes to mood/headaches: no Hair loss/changes to skin/nails: no Difficulty focusing/concentrating: no Sweating: no Limb weakness: no Dizziness/lightheadedness: no Palpitations: no   Carbonated/caffeinated beverages: no N/V/D/C/Gas: no Abdominal pain: no Dumping syndrome: no    NUTRITION DIAGNOSIS  Overweight/obesity ([place]-3.3) related to past poor dietary habits and physical inactivity as evidenced by completed bariatric surgery and following dietary guidelines for continued weight loss and healthy nutrition status.     NUTRITION INTERVENTION Nutrition counseling (C-1) and education (E-2) to facilitate bariatric surgery goals, including: Diet advancement to the next phase (phase 4) now including non starchy vegetables The importance of consuming adequate calories as well as certain nutrients daily due to the body's need for essential vitamins, minerals, and fats The importance of daily physical activity and to reach a goal of at least 150 minutes of moderate to vigorous physical activity weekly (or as directed by their physician) due to benefits such as increased musculature and improved lab values The importance of intuitive eating specifically learning hunger-satiety cues and understanding the importance of learning a new body: The importance of mindful eating to avoid grazing behaviors  Goals: -Continue to aim for a minimum of 64 fluid ounces 7 days a week with at least 30 ounces being plain water  -Eat non-starchy vegetables 2 times a day 7 days a week  -Start out with soft cooked vegetables today and tomorrow; if tolerated begin to eat raw vegetables or cooked including salads  -Eat your 3 ounces of protein first then start in on your non-starchy vegetables; once you understand how much of your meal leads to satisfaction and not full while still eating 3 ounces of protein and non-starchy vegetables you can eat them in any order   -Continue to aim for  30 minutes of activity at least 5 times a week  -Do NOT cook with/add to your food: alfredo sauce, cheese sauce, barbeque sauce, ketchup, fat back, butter, bacon grease, grease, Crisco, OR SUGAR  -aim to have at  least one solid meal per day with the ending goal being 3   Handouts Provided Include  Phase 4  Learning Style & Readiness for Change Teaching method utilized: Visual & Auditory  Demonstrated degree of understanding via: Teach Back  Readiness Level: contemplative  Barriers to learning/adherence to lifestyle change: texture aversions   RD's Notes for Next Visit Assess adherence to pt chosen goals     MONITORING & EVALUATION Dietary intake, weekly physical activity, body weight  Next Steps Patient is to follow-up in February

## 2021-07-10 NOTE — Progress Notes (Signed)
Preoperative testing

## 2021-09-10 ENCOUNTER — Other Ambulatory Visit: Payer: Self-pay | Admitting: Gastroenterology

## 2021-10-30 ENCOUNTER — Encounter: Payer: Self-pay | Admitting: Gastroenterology

## 2021-10-31 ENCOUNTER — Telehealth: Payer: Self-pay | Admitting: Skilled Nursing Facility1

## 2021-10-31 NOTE — Telephone Encounter (Signed)
Returned pts call. ? ? ?Pt states he would like an apt. ? ?Set up for March 28th 11:30 ? ? ?

## 2021-11-05 ENCOUNTER — Encounter: Payer: BC Managed Care – PPO | Attending: Surgery | Admitting: Skilled Nursing Facility1

## 2021-11-05 ENCOUNTER — Other Ambulatory Visit: Payer: Self-pay

## 2021-11-05 DIAGNOSIS — Z713 Dietary counseling and surveillance: Secondary | ICD-10-CM | POA: Insufficient documentation

## 2021-11-05 DIAGNOSIS — Z6841 Body Mass Index (BMI) 40.0 and over, adult: Secondary | ICD-10-CM | POA: Diagnosis not present

## 2021-11-05 NOTE — Progress Notes (Signed)
Bariatric Nutrition Follow-Up Visit ?Medical Nutrition Therapy  ? ?NUTRITION ASSESSMENT ?  ? ?Surgery date: 05/13/2021 ?Surgery type: RYGB ?Start weight at [facility]: 475.8 pounds ?Weight today: 354.2 pounds ? ?Clinical  ?Medical hx: depression, anxiety, sleep apnea, kidney stones ?Medications: see list ?Labs: none update in EMR ?Notable signs/symptoms: hip pain (both replaced), back pain, neck pain ?Any previous deficiencies? Vitamin D ? ?Body Composition Scale 11/05/2021  ?Current Body Weight 354.2  ?Total Body Fat % 40  ?Visceral Fat 39  ?Fat-Free Mass % 59.9  ? Total Body Water % 40.9  ?Muscle-Mass lbs 66.9  ?BMI 46.6  ?Body Fat Displacement   ?       Torso  lbs 88.1  ?       Left Leg  lbs 17.6  ?       Right Leg  lbs 17.6  ?       Left Arm  lbs 8.8  ?       Right Arm   lbs 8.8  ? ? ?  ?Lifestyle & Dietary Hx ? ?Out of breath: since before surgery; reports having gotten better ?weak legs: since before surgery; no change ?Off balance: since before surgery; no change ? ?Pt states he does not have an appetite and he does not experience hunger (Dietitian explained both and gave specific examples so pt could answer fully).  ?Pt states he wakes about 8am.  ?Pt states he will eat carrots, coleslaw, cucumbers, green beans, but that is it for non-starchy vegetables. ?Pt reports back pain, which is keeping him from being active. ?Pt reports fatigue and tiredness in a general malaise with his food choices. ?Pt states he is bored with the current phases and does not feel he has a lot of options with his foods. ? ?Pt states with the addition of these new foods he will feel better overall and is excited to move forward. ? ? ?Estimated daily fluid intake: 80 oz ?Estimated daily protein intake: 80 g ?Supplements: multi and calcium ?Physical activity: ADL's some on his bike sometimes  ? ?24-Hr Dietary Recall ?First Meal 11am: protein shake (powerade zero + unflavored protein powder) ?Snack:  ?Second Meal 1pm: pork rinds ?Snack 4:   quest protein bar ?Third Meal 5:30-6: chicken ?Snack:  ?Beverages:  water ? ?Post-Op Goals/ Signs/ Symptoms ?Using straws: no ?Drinking while eating: no ?Chewing/swallowing difficulties: no ?Changes in vision: no ?Changes to mood/headaches: no ?Hair loss/changes to skin/nails: no ?Difficulty focusing/concentrating: no ?Sweating: no ?Limb weakness: no ?Dizziness/lightheadedness: no ?Palpitations: no  ?Carbonated/caffeinated beverages: no ?N/V/D/C/Gas: no ?Abdominal pain: no ?Dumping syndrome: no ? ?  ?NUTRITION DIAGNOSIS  ?Overweight/obesity ([place]-3.3) related to past poor dietary habits and physical inactivity as evidenced by completed bariatric surgery and following dietary guidelines for continued weight loss and healthy nutrition status. ?  ?  ?NUTRITION INTERVENTION ?Nutrition counseling (C-1) and education (E-2) to facilitate bariatric surgery goals, including: ?Diet advancement to the next phase now including starchy and fruit ?The importance of consuming adequate calories as well as certain nutrients daily due to the body's need for essential vitamins, minerals, and fats ?The importance of daily physical activity and to reach a goal of at least 150 minutes of moderate to vigorous physical activity weekly (or as directed by their physician) due to benefits such as increased musculature and improved lab values ?The importance of intuitive eating specifically learning hunger-satiety cues and understanding the importance of learning a new body: The importance of mindful eating to avoid grazing behaviors  ?Macronutrient distribution,  inclusive of complex vs. Simple carbohydrates, and unsaturated v. Saturated fats ? ?Importance of vegetables ?To have an overall healthy diet, adult men and women are recommended to consume anywhere from 2-3 cups of vegetables daily. Vegetables provide a wide range of vitamins and minerals such as vitamin A, vitamin C, potassium, and folic acid. According to the [employer], including fruit and vegetables daily may reduce the risk of cardiovascular disease, certain cancers, and other non-communicable diseases. ?Why physical activity is needed:  Physical activity has various benefits for the body such as reducing stress, helping control blood glucose levels, strengthening the heart, reducing incidence of cognitive impairment, and has a neuroprotective effect on memory function. Research reports that physical activity (whether it be aerobic, resistance training, or high intensity interval training) help control the level of reactive oxygen species which can lead to oxidative stress on the brain therefor possibly impairing memory, learning abilities, and other cognitive functions. The CDC recognizes that physical activity can help prevent premature death, may prevent the development of type 2 diabetes, different cancers, and heart disease. Not only are there are health benefits to physical activity, the CDC also reports that physical activity would decrease health care costs, increase property values, and people who are physically active generally have less sick days. ?Resource https://www-sciencedirect-com.libproxy.http://www.castillo-fisher.org/, CDC ? ? ?Goals: ?-Continue to aim for a minimum of 64 fluid ounces 7 days a week with at least 30 ounces being plain water ? ?-Eat non-starchy vegetables 2 times a day 7 days a week ? ?-Eat your 3 ounces of protein first then start in on your non-starchy vegetables; once you understand how much of your meal leads to satisfaction and not full while still eating 3 ounces of protein and non-starchy vegetables you can eat them in any order  ? ?-Continue to aim for 30 minutes of activity at least 5 times a week ? ?-Do NOT cook with/add to your food: alfredo sauce, cheese sauce, barbeque sauce, ketchup, fat back, butter, bacon grease, grease, Crisco, OR SUGAR ? ?-Aim to eat every 3 hours, to consume adequate  nutrition. ? ?-Take advantage of the days you are not in pain and exercise 10 minutes on the bike. ? ?-Choose complex carbohydrates, avoiding simple carbohydrates. ? ?-Choose plant based fats (unsaturated fats), over animal based fats (saturated fats). ? ? ?Handouts Provided Include  ?Bariatric My Plate ?Portion size / serving size handout ? ?Learning Style & Readiness for Change ?Teaching method utilized: Visual & Auditory  ?Demonstrated degree of understanding via: Teach Back  ?Readiness Level: contemplative  ?Barriers to learning/adherence to lifestyle change: texture aversions  ? ?RD's Notes for Next Visit ?Assess adherence to pt chosen goals ?  ? ? ?MONITORING & EVALUATION ?Dietary intake, weekly physical activity, body weight ? ?Next Steps ?Patient is to follow-up in 6 weeks ? ?

## 2021-12-17 ENCOUNTER — Ambulatory Visit: Payer: BC Managed Care – PPO | Admitting: Skilled Nursing Facility1

## 2021-12-17 ENCOUNTER — Encounter: Payer: BC Managed Care – PPO | Attending: Surgery | Admitting: Skilled Nursing Facility1

## 2021-12-17 DIAGNOSIS — Z6841 Body Mass Index (BMI) 40.0 and over, adult: Secondary | ICD-10-CM | POA: Diagnosis not present

## 2021-12-17 DIAGNOSIS — Z713 Dietary counseling and surveillance: Secondary | ICD-10-CM | POA: Insufficient documentation

## 2021-12-17 NOTE — Progress Notes (Signed)
Bariatric Nutrition Follow-Up Visit ?Medical Nutrition Therapy  ? ?NUTRITION ASSESSMENT ?  ? ?Surgery date: 05/13/2021 ?Surgery type: RYGB ?Start weight at [facility]: 475.8 pounds ?Weight today: 343 pounds ? ?Clinical  ?Medical hx: depression, anxiety, sleep apnea, kidney stones ?Medications: see list ?Labs: none update in EMR ?Notable signs/symptoms: hip pain (both replaced), back pain, neck pain ?Any previous deficiencies? Vitamin D ? ?Body Composition Scale 11/05/2021  ?Current Body Weight 354.2  ?Total Body Fat % 40  ?Visceral Fat 39  ?Fat-Free Mass % 59.9  ? Total Body Water % 40.9  ?Muscle-Mass lbs 66.9  ?BMI 46.6  ?Body Fat Displacement   ?       Torso  lbs 88.1  ?       Left Leg  lbs 17.6  ?       Right Leg  lbs 17.6  ?       Left Arm  lbs 8.8  ?       Right Arm   lbs 8.8  ? ? ?  ?Lifestyle & Dietary Hx ? ?Out of breath: since before surgery; reports having gotten better; even better still! ?weak legs: since before surgery; gotten better! ?Off balance: since before surgery; gotten better! ? ? ?Pt states with the addition of these new foods he will feel better overall and is excited to move forward. ? ? ?Pt states he has been able to move more and breath better since he has been more active. Pt states he is aiming for daily getting on his bike hitting every other day, hitting 13 minutes up from 5 minutes. ?Pt states he has cut back on pork rinds.  ?Pt states he not much of a beef fan anymore.  ?Pt states he does like raw broccoli, cucmbers, carrots but not many other non starchy vegetables but is trying to incorporate more.  ? ?Try vegetables in a different way  ? ?Pt states his Balance is better since getting on his bike more often.  ? ? ?Estimated daily fluid intake: 80 oz ?Estimated daily protein intake: 80 g ?Supplements: multi and calcium ?Physical activity: on his bike 13 minutes every other day ? ?24-Hr Dietary Recall ?First Meal 11am: atkins bar and protein shake ?Snack:  ?Second Meal 2-3 pm: high fiber  tortilla + chicken + lettuce + pickles  + onion ?Snack 4:  nuts ?Third Meal 5:30-6: chicken nuggets and french fries  ?Snack:  ?Beverages:  water, poweder protein  ? ?Post-Op Goals/ Signs/ Symptoms ?Using straws: no ?Drinking while eating: no ?Chewing/swallowing difficulties: no ?Changes in vision: no ?Changes to mood/headaches: no ?Hair loss/changes to skin/nails: no ?Difficulty focusing/concentrating: no ?Sweating: no ?Limb weakness: no ?Dizziness/lightheadedness: no ?Palpitations: no  ?Carbonated/caffeinated beverages: no ?N/V/D/C/Gas: no ?Abdominal pain: no ?Dumping syndrome: no ? ?  ?NUTRITION DIAGNOSIS  ?Overweight/obesity ([place]-3.3) related to past poor dietary habits and physical inactivity as evidenced by completed bariatric surgery and following dietary guidelines for continued weight loss and healthy nutrition status. ?  ?  ?NUTRITION INTERVENTION ?Nutrition counseling (C-1) and education (E-2) to facilitate bariatric surgery goals, including: ?Diet advancement to the next phase now including starchy and fruit ?The importance of consuming adequate calories as well as certain nutrients daily due to the body's need for essential vitamins, minerals, and fats ?The importance of daily physical activity and to reach a goal of at least 150 minutes of moderate to vigorous physical activity weekly (or as directed by their physician) due to benefits such as increased musculature and improved lab values ?  The importance of intuitive eating specifically learning hunger-satiety cues and understanding the importance of learning a new body: The importance of mindful eating to avoid grazing behaviors  ?Macronutrient distribution, inclusive of complex vs. Simple carbohydrates, and unsaturated v. Saturated fats ? ?Importance of vegetables ?To have an overall healthy diet, adult men and women are recommended to consume anywhere from 2-3 cups of vegetables daily. Vegetables provide a wide range of vitamins and minerals such as  vitamin A, vitamin C, potassium, and folic acid. According to the [employer], including fruit and vegetables daily may reduce the risk of cardiovascular disease, certain cancers, and other non-communicable diseases. ?Why physical activity is needed:  Physical activity has various benefits for the body such as reducing stress, helping control blood glucose levels, strengthening the heart, reducing incidence of cognitive impairment, and has a neuroprotective effect on memory function. Research reports that physical activity (whether it be aerobic, resistance training, or high intensity interval training) help control the level of reactive oxygen species which can lead to oxidative stress on the brain therefor possibly impairing memory, learning abilities, and other cognitive functions. The CDC recognizes that physical activity can help prevent premature death, may prevent the development of type 2 diabetes, different cancers, and heart disease. Not only are there are health benefits to physical activity, the CDC also reports that physical activity would decrease health care costs, increase property values, and people who are physically active generally have less sick days. ?Resource https://www-sciencedirect-com.libproxy.http://www.castillo-fisher.org/, CDC ? ? ?Goals: ?-Continue to aim for a minimum of 64 fluid ounces 7 days a week with at least 30 ounces being plain water ? ?-Eat non-starchy vegetables 2 times a day 7 days a week ? ?-Eat your 3 ounces of protein first then start in on your non-starchy vegetables; once you understand how much of your meal leads to satisfaction and not full while still eating 3 ounces of protein and non-starchy vegetables you can eat them in any order  ? ?-Continue to aim for 30 minutes of activity at least 5 times a week ? ?-Do NOT cook with/add to your food: alfredo sauce, cheese sauce, barbeque sauce, ketchup, fat back, butter, bacon grease, grease,  Crisco, OR SUGAR ? ?-Aim to eat every 3 hours, to consume adequate nutrition. ? ?-Take advantage of the days you are not in pain and exercise 10 minutes on the bike. ? ?-Choose complex carbohydrates, avoiding simple carbohydrates. ? ?-Choose plant based fats (unsaturated fats), over animal based fats (saturated fats). ? ? ?Handouts Provided Include  ?Bariatric My Plate ?Portion size / serving size handout ? ?Learning Style & Readiness for Change ?Teaching method utilized: Visual & Auditory  ?Demonstrated degree of understanding via: Teach Back  ?Readiness Level: contemplative  ?Barriers to learning/adherence to lifestyle change: texture aversions  ? ?RD's Notes for Next Visit ?Assess adherence to pt chosen goals ?  ? ? ?MONITORING & EVALUATION ?Dietary intake, weekly physical activity, body weight ? ?Next Steps ?Patient is to follow-up in 6 weeks ? ?

## 2021-12-18 ENCOUNTER — Encounter: Payer: Self-pay | Admitting: Skilled Nursing Facility1

## 2022-05-13 ENCOUNTER — Other Ambulatory Visit: Payer: Self-pay

## 2022-05-13 ENCOUNTER — Emergency Department (HOSPITAL_COMMUNITY): Payer: BC Managed Care – PPO

## 2022-05-13 ENCOUNTER — Emergency Department (HOSPITAL_COMMUNITY)
Admission: EM | Admit: 2022-05-13 | Discharge: 2022-05-13 | Disposition: A | Discharge status: Home / Self Care | Payer: BC Managed Care – PPO | Attending: Emergency Medicine | Admitting: Emergency Medicine

## 2022-05-13 ENCOUNTER — Encounter (HOSPITAL_COMMUNITY): Payer: Self-pay | Admitting: Emergency Medicine

## 2022-05-13 DIAGNOSIS — N200 Calculus of kidney: Secondary | ICD-10-CM | POA: Diagnosis not present

## 2022-05-13 DIAGNOSIS — Z79899 Other long term (current) drug therapy: Secondary | ICD-10-CM | POA: Diagnosis not present

## 2022-05-13 DIAGNOSIS — Z7982 Long term (current) use of aspirin: Secondary | ICD-10-CM | POA: Insufficient documentation

## 2022-05-13 DIAGNOSIS — I10 Essential (primary) hypertension: Secondary | ICD-10-CM | POA: Diagnosis not present

## 2022-05-13 DIAGNOSIS — R109 Unspecified abdominal pain: Secondary | ICD-10-CM | POA: Diagnosis present

## 2022-05-13 LAB — BASIC METABOLIC PANEL
Anion gap: 9 (ref 5–15)
BUN: 14 mg/dL (ref 8–23)
CO2: 23 mmol/L (ref 22–32)
Calcium: 9.3 mg/dL (ref 8.9–10.3)
Chloride: 107 mmol/L (ref 98–111)
Creatinine, Ser: 1.57 mg/dL — ABNORMAL HIGH (ref 0.61–1.24)
GFR, Estimated: 49 mL/min — ABNORMAL LOW (ref >60)
Glucose, Bld: 110 mg/dL — ABNORMAL HIGH (ref 70–99)
Potassium: 3.3 mmol/L — ABNORMAL LOW (ref 3.5–5.1)
Sodium: 139 mmol/L (ref 135–145)

## 2022-05-13 LAB — URINALYSIS, ROUTINE W REFLEX MICROSCOPIC
Bilirubin Urine: NEGATIVE
Glucose, UA: NEGATIVE mg/dL
Hgb urine dipstick: NEGATIVE
Ketones, ur: 5 mg/dL — AB
Leukocytes,Ua: NEGATIVE
Nitrite: NEGATIVE
Protein, ur: NEGATIVE mg/dL
Specific Gravity, Urine: 1.015 (ref 1.005–1.030)
pH: 5 (ref 5.0–8.0)

## 2022-05-13 LAB — CBC
HCT: 46.5 % (ref 39.0–52.0)
Hemoglobin: 15.7 g/dL (ref 13.0–17.0)
MCH: 29.7 pg (ref 26.0–34.0)
MCHC: 33.8 g/dL (ref 30.0–36.0)
MCV: 88.1 fL (ref 80.0–100.0)
Platelets: 213 10*3/uL (ref 150–400)
RBC: 5.28 MIL/uL (ref 4.22–5.81)
RDW: 13.4 % (ref 11.5–15.5)
WBC: 8.8 10*3/uL (ref 4.0–10.5)
nRBC: 0 % (ref 0.0–0.2)

## 2022-05-13 MED ORDER — TAMSULOSIN HCL 0.4 MG PO CAPS: 0.4000 mg | ORAL_CAPSULE | Freq: Every day | ORAL | 0 refills | Status: DC

## 2022-05-13 MED ORDER — MORPHINE SULFATE (PF) 4 MG/ML IV SOLN
4.0000 mg | Freq: Once | INTRAVENOUS | Status: AC
  Administered 2022-05-13: 4 mg via INTRAVENOUS
  Filled 2022-05-13: qty 1

## 2022-05-13 MED ORDER — ONDANSETRON HCL 4 MG/2ML IJ SOLN
4.0000 mg | Freq: Once | INTRAMUSCULAR | Status: AC
  Administered 2022-05-13: 4 mg via INTRAVENOUS
  Filled 2022-05-13: qty 2

## 2022-05-13 MED ORDER — SODIUM CHLORIDE 0.9 % IV BOLUS
500.0000 mL | Freq: Once | INTRAVENOUS | Status: AC
  Administered 2022-05-13: 500 mL via INTRAVENOUS

## 2022-05-13 MED ORDER — HYDROCODONE-ACETAMINOPHEN 5-325 MG PO TABS: 1.0000 | ORAL_TABLET | ORAL | 0 refills | Status: DC | PRN

## 2022-05-13 MED ORDER — ONDANSETRON 4 MG PO TBDP: 4.0000 mg | ORAL_TABLET | Freq: Three times a day (TID) | ORAL | 0 refills | Status: DC | PRN

## 2022-05-13 NOTE — ED Provider Notes (Signed)
[facility] EMERGENCY DEPARTMENT Provider Note   CSN: [id number] Arrival date & time: 05/13/22  1108     History  Chief Complaint  Patient presents with   Flank Pain    Marcus Hutchinson is a 61 y.o. male.  Pt is a 61 yo male with a pmhx significant for kidney stones, OSA, HTN, arthritis and depression.  Pt woke up this am around 0300 with right sided flank pain.  It does feel like prior kidney stones, but he has not had one in awhile.  Pt has nausea, but no vomiting. No fevers.       Home Medications Prior to Admission medications   Medication Sig Start Date End Date Taking? Authorizing Provider  aspirin EC 81 MG tablet Take 81 mg by mouth daily.   Yes [provider]  buPROPion (WELLBUTRIN XL) 150 MG 24 hr tablet Take 150 mg by mouth daily. 02/07/16  Yes [provider]  Calcium Carbonate (SM CALCIUM 600 PO) Take 600 mg by mouth 3 (three) times daily.   Yes [provider]  colchicine 0.6 MG tablet Take 1 tablet by mouth daily. 05/12/22  Yes [provider]  fluticasone (FLONASE) 50 MCG/ACT nasal spray Place 2 sprays into both nostrils daily as needed for allergies. 08/13/20  Yes [provider]  gabapentin (NEURONTIN) 300 MG capsule Take 300 mg by mouth 2 (two) times daily. 09/12/20  Yes [provider]  HYDROcodone-acetaminophen (NORCO/VICODIN) 5-325 MG tablet Take 1 tablet by mouth every 4 (four) hours as needed. 05/13/22  Yes Isla Pence, MD  methocarbamol (ROBAXIN) 500 MG tablet Take 1 tablet by mouth 2 (two) times daily as needed for muscle spasms. 12/13/20  Yes [provider]  metoprolol succinate (TOPROL-XL) 50 MG 24 hr tablet Take 50 mg by mouth daily. 09/12/20  Yes [provider]  Multiple Vitamins-Minerals (BARIATRIC FUSION PO) Take 1 tablet by mouth daily.   Yes [provider]  ondansetron (ZOFRAN-ODT) 4 MG disintegrating tablet Take 1 tablet (4 mg total) by mouth every 8 (eight) hours as  needed. 05/13/22  Yes Isla Pence, MD  Propylene Glycol (SYSTANE COMPLETE) 0.6 % SOLN Place 1 drop into both eyes daily as needed (dry eyes).   Yes [provider]  tamsulosin (FLOMAX) 0.4 MG CAPS capsule Take 1 capsule (0.4 mg total) by mouth daily. 05/13/22  Yes Isla Pence, MD  Vitamin D, Ergocalciferol, (DRISDOL) 1.25 MG (50000 UNIT) CAPS capsule Take 50,000 Units by mouth 2 (two) times a week. Mon, Thursday 09/12/20  Yes [provider]  enoxaparin (LOVENOX) 60 MG/0.6ML injection Inject 2.05 mLs (205 mg total) into the skin every 12 (twelve) hours for 28 days. 05/14/21 06/11/21  Stechschulte, Nickola Major, MD  omeprazole (PRILOSEC) 20 MG capsule Take 1 capsule (20 mg total) by mouth daily. Patient not taking: Reported on 05/13/2022 02/12/21   Mansouraty, Telford Nab., MD  oxyCODONE (OXY IR/ROXICODONE) 5 MG immediate release tablet Take 1 tablet (5 mg total) by mouth every 6 (six) hours as needed for severe pain. Patient not taking: Reported on 05/13/2022 05/14/21   Stechschulte, Nickola Major, MD  pantoprazole (PROTONIX) 40 MG tablet Take 1 tablet (40 mg total) by mouth daily. Patient not taking: Reported on 05/13/2022 05/14/21   Stechschulte, Nickola Major, MD      Allergies    Tape    Review of Systems   Review of Systems  Genitourinary:  Positive for flank pain.  All other systems reviewed and are negative.  Physical Exam Updated Vital Signs BP (!) 168/97   Pulse (!) 55   Temp 98.6 F (37 C) (Oral)   Resp 18   Ht '6\' 1"'$  (1.854 m)   Wt (!) 145.2 kg   SpO2 100%   BMI 42.22 kg/m  Physical Exam Vitals and nursing note reviewed.  Constitutional:      Appearance: Normal appearance.  HENT:     Head: Normocephalic and atraumatic.     Right Ear: External ear normal.     Left Ear: External ear normal.     Nose: Nose normal.     Mouth/Throat:     Mouth: Mucous membranes are moist.     Pharynx: Oropharynx is clear.  Eyes:     Extraocular Movements: Extraocular movements intact.      Conjunctiva/sclera: Conjunctivae normal.     Pupils: Pupils are equal, round, and reactive to light.  Cardiovascular:     Rate and Rhythm: Normal rate and regular rhythm.     Pulses: Normal pulses.     Heart sounds: Normal heart sounds.  Pulmonary:     Effort: Pulmonary effort is normal.     Breath sounds: Normal breath sounds.  Abdominal:     General: Abdomen is flat. Bowel sounds are normal.     Palpations: Abdomen is soft.  Musculoskeletal:        General: Normal range of motion.     Cervical back: Normal range of motion and neck supple.  Skin:    General: Skin is warm.     Capillary Refill: Capillary refill takes less than 2 seconds.  Neurological:     General: No focal deficit present.     Mental Status: He is alert and oriented to person, place, and time.  Psychiatric:        Mood and Affect: Mood normal.        Behavior: Behavior normal.     ED Results / Procedures / Treatments   Labs (all labs ordered are listed, but only abnormal results are displayed) Labs Reviewed  URINALYSIS, ROUTINE W REFLEX MICROSCOPIC - Abnormal; Notable for the following components:      Result Value   Ketones, ur 5 (*)    All other components within normal limits  BASIC METABOLIC PANEL - Abnormal; Notable for the following components:   Potassium 3.3 (*)    Glucose, Bld 110 (*)    Creatinine, Ser 1.57 (*)    GFR, Estimated 49 (*)    All other components within normal limits  URINE CULTURE  CBC    EKG None  Radiology CT Renal Stone Study  Result Date: 05/13/2022 CLINICAL DATA:  Right flank pain with decreased urine since 3 a.m. Denies hematuria EXAM: CT ABDOMEN AND PELVIS WITHOUT CONTRAST TECHNIQUE: Multidetector CT imaging of the abdomen and pelvis was performed following the standard protocol without IV contrast. RADIATION DOSE REDUCTION: This exam was performed according to the departmental dose-optimization program which includes automated exposure control, adjustment of the mA  and/or kV according to patient size and/or use of iterative reconstruction technique. COMPARISON:  CT lumbar spine May 27, 2018. FINDINGS: Lower chest: 7 mm ground-glass pulmonary nodule in the right lower lobe on image 14/4. Hepatobiliary: Unremarkable noncontrast enhanced appearance of the hepatic parenchyma. Gallbladder is surgically absent. No biliary ductal dilation. Pancreas: No pancreatic ductal dilation or evidence of acute inflammation. Spleen: No splenomegaly. Adrenals/Urinary Tract: Bilateral adrenal glands appear normal. Right-sided perinephric/periureteric stranding with hydroureteronephrosis to a 2 mm stone in the  distal right ureter near the ureterovesicular junction. No left-sided hydronephrosis. Exophytic 17 mm left interpolar renal lesion measures fluid density consistent with a cyst and is considered benign requiring no independent imaging follow-up. Evaluation of the urinary bladder is limited by streak artifact from bilateral hip prostheses and underdistention. Stomach/Bowel: No radiopaque enteric contrast material was administered. Prior gastric bypass surgery. No pathologic dilation of small or large bowel. The appendix and terminal ileum appear normal. Colonic diverticulosis without findings of acute diverticulitis. Vascular/Lymphatic: Normal caliber abdominal aorta. No significant abdominopelvic free fluid. Reproductive: Evaluation is limited by streak artifact from bilateral hip arthroplasties. Other: No significant abdominopelvic free fluid. Musculoskeletal: Advanced multilevel degenerative changes spine. Bilateral total hip arthroplasties IMPRESSION: 1. Right-sided perinephric/periureteric stranding with hydroureteronephrosis to a 2 mm stone in the distal right ureter near the ureterovesicular junction. Suggest correlation with laboratory values to exclude superimposed infection. 2. 7 mm right ground-glass pulmonary nodule. Per Fleischner Society Guidelines, recommend a non-contrast  Chest CT at 6-12 months to confirm persistence, then additional non-contrast Chest CTs every 2 years until 5 years. If nodule grows or develops solid component(s), consider resection. These guidelines do not apply to immunocompromised patients and patients with cancer. Follow up in patients with significant comorbidities as clinically warranted. For lung cancer screening, adhere to Lung-RADS guidelines. Reference: Radiology. 2017; 284(1):228-43. 3. Colonic diverticulosis without findings of acute diverticulitis. Electronically Signed   By: Dahlia Bailiff M.D.   On: 05/13/2022 13:22    Procedures Procedures    Medications Ordered in ED Medications  sodium chloride 0.9 % bolus 500 mL (0 mLs Intravenous Stopped 05/13/22 1436)  morphine (PF) 4 MG/ML injection 4 mg (4 mg Intravenous Given 05/13/22 1308)  ondansetron (ZOFRAN) injection 4 mg (4 mg Intravenous Given 05/13/22 1307)    ED Course/ Medical Decision Making/ A&P                           Medical Decision Making Amount and/or Complexity of Data Reviewed Labs: ordered. Radiology: ordered.  Risk Prescription drug management.   This patient presents to the ED for concern of flank pain, this involves an extensive number of treatment options, and is a complaint that carries with it a high risk of complications and morbidity.  The differential diagnosis includes kidney stone, pyelo, uti   Co morbidities that complicate the patient evaluation  kidney stones, OSA, HTN, arthritis and depression   Additional history obtained:  Additional history obtained from epic chart review External records from outside source obtained and reviewed including family   Lab Tests:  I Ordered, and personally interpreted labs.  The pertinent results include:  cbc nl; bmp with k slightly low at 3.3 and cr slightly elevated at 1.57 (chronic); ua neg other than ketones   Imaging Studies ordered:  I ordered imaging studies including ct renal  I  independently visualized and interpreted imaging which showed  CT renal: IMPRESSION:  1. Right-sided perinephric/periureteric stranding with  hydroureteronephrosis to a 2 mm stone in the distal right ureter  near the ureterovesicular junction. Suggest correlation with  laboratory values to exclude superimposed infection.  2. 7 mm right ground-glass pulmonary nodule. Per Fleischner Society  Guidelines, recommend a non-contrast Chest CT at 6-12 months to  confirm persistence, then additional non-contrast Chest CTs every 2  years until 5 years. If nodule grows or develops solid component(s),  consider resection.   I agree with the radiologist interpretation   Cardiac Monitoring:  The patient  was maintained on a cardiac monitor.  I personally viewed and interpreted the cardiac monitored which showed an underlying rhythm of: nsr   Medicines ordered and prescription drug management:  I ordered medication including morphine/zofran  for pain/nausea  Reevaluation of the patient after these medicines showed that the patient improved I have reviewed the patients home medicines and have made adjustments as needed   Test Considered:  ct   Critical Interventions:  Pain control     Problem List / ED Course:  Flank pain:  due to a 2 mm stone.  He has no evidence of UTI.  He is feeling much better.  He is stable for d/c.  He is to return if worse.  F/u with urology.   Reevaluation:  After the interventions noted above, I reevaluated the patient and found that they have :improved   Social Determinants of Health:  Lives at home   Dispostion:  After consideration of the diagnostic results and the patients response to treatment, I feel that the patent would benefit from discharge with outpatient f/u.          Final Clinical Impression(s) / ED Diagnoses Final diagnoses:  Kidney stone    Rx / DC Orders ED Discharge Orders          Ordered     HYDROcodone-acetaminophen (NORCO/VICODIN) 5-325 MG tablet  Every 4 hours PRN        05/13/22 1511    ondansetron (ZOFRAN-ODT) 4 MG disintegrating tablet  Every 8 hours PRN        05/13/22 1511    tamsulosin (FLOMAX) 0.4 MG CAPS capsule  Daily        05/13/22 1511              Isla Pence, MD 05/13/22 1513

## 2022-05-13 NOTE — ED Triage Notes (Signed)
Pt presents with right flank pain with decreased urination since 3 am this morning, last voided around 10 am today, PMH includes kidney stones.

## 2022-05-13 NOTE — ED Notes (Signed)
Patient transported to CT 

## 2022-05-13 NOTE — ED Notes (Signed)
Bladder scanned pt and found only 56m

## 2022-05-15 LAB — URINE CULTURE: Culture: NO GROWTH

## 2022-05-19 ENCOUNTER — Ambulatory Visit: Payer: BC Managed Care – PPO | Admitting: Skilled Nursing Facility1

## 2023-10-08 DIAGNOSIS — Z299 Encounter for prophylactic measures, unspecified: Secondary | ICD-10-CM | POA: Diagnosis not present

## 2023-10-08 DIAGNOSIS — R6889 Other general symptoms and signs: Secondary | ICD-10-CM | POA: Diagnosis not present

## 2023-12-07 DIAGNOSIS — H35033 Hypertensive retinopathy, bilateral: Secondary | ICD-10-CM | POA: Diagnosis not present

## 2023-12-07 DIAGNOSIS — H524 Presbyopia: Secondary | ICD-10-CM | POA: Diagnosis not present

## 2023-12-17 ENCOUNTER — Encounter (HOSPITAL_COMMUNITY): Payer: Self-pay | Admitting: *Deleted

## 2023-12-28 DIAGNOSIS — H25813 Combined forms of age-related cataract, bilateral: Secondary | ICD-10-CM | POA: Diagnosis not present

## 2023-12-28 DIAGNOSIS — H01004 Unspecified blepharitis left upper eyelid: Secondary | ICD-10-CM | POA: Diagnosis not present

## 2023-12-28 DIAGNOSIS — H01001 Unspecified blepharitis right upper eyelid: Secondary | ICD-10-CM | POA: Diagnosis not present

## 2023-12-28 DIAGNOSIS — H01002 Unspecified blepharitis right lower eyelid: Secondary | ICD-10-CM | POA: Diagnosis not present

## 2024-01-22 DIAGNOSIS — M109 Gout, unspecified: Secondary | ICD-10-CM | POA: Diagnosis not present

## 2024-01-22 DIAGNOSIS — I10 Essential (primary) hypertension: Secondary | ICD-10-CM | POA: Diagnosis not present

## 2024-02-07 ENCOUNTER — Encounter (HOSPITAL_COMMUNITY): Payer: Self-pay

## 2024-02-07 ENCOUNTER — Other Ambulatory Visit: Payer: Self-pay

## 2024-02-07 ENCOUNTER — Encounter (HOSPITAL_COMMUNITY)
Admission: EM | Disposition: A | Discharge status: Home / Self Care | Payer: Self-pay | Source: Other Acute Inpatient Hospital | Attending: Surgery

## 2024-02-07 ENCOUNTER — Inpatient Hospital Stay (HOSPITAL_COMMUNITY)
Admission: EM | Admit: 2024-02-07 | Discharge: 2024-02-09 | DRG: 357 | Disposition: A | Discharge status: Home / Self Care | Source: Other Acute Inpatient Hospital | Attending: Surgery | Admitting: Surgery

## 2024-02-07 ENCOUNTER — Emergency Department (HOSPITAL_COMMUNITY): Admitting: Anesthesiology

## 2024-02-07 DIAGNOSIS — Z6841 Body Mass Index (BMI) 40.0 and over, adult: Secondary | ICD-10-CM | POA: Diagnosis not present

## 2024-02-07 DIAGNOSIS — Z8249 Family history of ischemic heart disease and other diseases of the circulatory system: Secondary | ICD-10-CM | POA: Diagnosis not present

## 2024-02-07 DIAGNOSIS — K458 Other specified abdominal hernia without obstruction or gangrene: Secondary | ICD-10-CM | POA: Diagnosis present

## 2024-02-07 DIAGNOSIS — I10 Essential (primary) hypertension: Secondary | ICD-10-CM

## 2024-02-07 DIAGNOSIS — G4730 Sleep apnea, unspecified: Secondary | ICD-10-CM | POA: Diagnosis present

## 2024-02-07 DIAGNOSIS — Z91048 Other nonmedicinal substance allergy status: Secondary | ICD-10-CM | POA: Diagnosis not present

## 2024-02-07 DIAGNOSIS — Z87891 Personal history of nicotine dependence: Secondary | ICD-10-CM

## 2024-02-07 DIAGNOSIS — R112 Nausea with vomiting, unspecified: Secondary | ICD-10-CM | POA: Diagnosis not present

## 2024-02-07 DIAGNOSIS — Z9884 Bariatric surgery status: Secondary | ICD-10-CM | POA: Diagnosis not present

## 2024-02-07 DIAGNOSIS — Z7901 Long term (current) use of anticoagulants: Secondary | ICD-10-CM

## 2024-02-07 DIAGNOSIS — N281 Cyst of kidney, acquired: Secondary | ICD-10-CM | POA: Diagnosis not present

## 2024-02-07 DIAGNOSIS — K56609 Unspecified intestinal obstruction, unspecified as to partial versus complete obstruction: Principal | ICD-10-CM

## 2024-02-07 DIAGNOSIS — Z96643 Presence of artificial hip joint, bilateral: Secondary | ICD-10-CM | POA: Diagnosis not present

## 2024-02-07 DIAGNOSIS — Z7982 Long term (current) use of aspirin: Secondary | ICD-10-CM

## 2024-02-07 DIAGNOSIS — E66813 Obesity, class 3: Secondary | ICD-10-CM | POA: Diagnosis present

## 2024-02-07 DIAGNOSIS — R079 Chest pain, unspecified: Secondary | ICD-10-CM | POA: Diagnosis not present

## 2024-02-07 DIAGNOSIS — Z79899 Other long term (current) drug therapy: Secondary | ICD-10-CM

## 2024-02-07 DIAGNOSIS — Z833 Family history of diabetes mellitus: Secondary | ICD-10-CM | POA: Diagnosis not present

## 2024-02-07 DIAGNOSIS — R109 Unspecified abdominal pain: Secondary | ICD-10-CM | POA: Diagnosis not present

## 2024-02-07 DIAGNOSIS — K469 Unspecified abdominal hernia without obstruction or gangrene: Secondary | ICD-10-CM

## 2024-02-07 DIAGNOSIS — Z8261 Family history of arthritis: Secondary | ICD-10-CM | POA: Diagnosis not present

## 2024-02-07 DIAGNOSIS — E872 Acidosis, unspecified: Secondary | ICD-10-CM | POA: Diagnosis not present

## 2024-02-07 DIAGNOSIS — M1990 Unspecified osteoarthritis, unspecified site: Secondary | ICD-10-CM | POA: Diagnosis not present

## 2024-02-07 DIAGNOSIS — Z9049 Acquired absence of other specified parts of digestive tract: Secondary | ICD-10-CM | POA: Diagnosis not present

## 2024-02-07 DIAGNOSIS — Z9109 Other allergy status, other than to drugs and biological substances: Secondary | ICD-10-CM | POA: Diagnosis not present

## 2024-02-07 DIAGNOSIS — K460 Unspecified abdominal hernia with obstruction, without gangrene: Secondary | ICD-10-CM | POA: Diagnosis not present

## 2024-02-07 DIAGNOSIS — K5730 Diverticulosis of large intestine without perforation or abscess without bleeding: Secondary | ICD-10-CM | POA: Diagnosis not present

## 2024-02-07 DIAGNOSIS — F32A Depression, unspecified: Secondary | ICD-10-CM | POA: Diagnosis present

## 2024-02-07 DIAGNOSIS — F419 Anxiety disorder, unspecified: Secondary | ICD-10-CM | POA: Diagnosis present

## 2024-02-07 HISTORY — PX: UMBILICAL HERNIA REPAIR: SHX196

## 2024-02-07 HISTORY — PX: LAPAROSCOPY: SHX197

## 2024-02-07 SURGERY — LAPAROSCOPY, DIAGNOSTIC
Anesthesia: General | Site: Abdomen

## 2024-02-07 MED ORDER — 0.9 % SODIUM CHLORIDE (POUR BTL) OPTIME
TOPICAL | Status: DC | PRN
  Administered 2024-02-07: 1000 mL

## 2024-02-07 MED ORDER — MIDAZOLAM HCL 2 MG/2ML IJ SOLN
INTRAMUSCULAR | Status: AC
  Filled 2024-02-07: qty 2

## 2024-02-07 MED ORDER — FENTANYL CITRATE (PF) 250 MCG/5ML IJ SOLN
INTRAMUSCULAR | Status: DC | PRN
  Administered 2024-02-07: 100 ug via INTRAVENOUS

## 2024-02-07 MED ORDER — ROCURONIUM 10MG/ML (10ML) SYRINGE FOR MEDFUSION PUMP - OPTIME
INTRAVENOUS | Status: DC | PRN
  Administered 2024-02-07: 40 mg via INTRAVENOUS

## 2024-02-07 MED ORDER — ROCURONIUM BROMIDE 10 MG/ML (PF) SYRINGE
PREFILLED_SYRINGE | INTRAVENOUS | Status: AC
  Filled 2024-02-07: qty 10

## 2024-02-07 MED ORDER — EPHEDRINE SULFATE (PRESSORS) 50 MG/ML IJ SOLN
INTRAMUSCULAR | Status: DC | PRN
  Administered 2024-02-07: 10 mg via INTRAVENOUS
  Administered 2024-02-07: 15 mg via INTRAVENOUS
  Administered 2024-02-07 (×2): 10 mg via INTRAVENOUS
  Administered 2024-02-07: 5 mg via INTRAVENOUS

## 2024-02-07 MED ORDER — DEXAMETHASONE SODIUM PHOSPHATE 10 MG/ML IJ SOLN
INTRAMUSCULAR | Status: DC | PRN
  Administered 2024-02-07: 10 mg via INTRAVENOUS

## 2024-02-07 MED ORDER — ONDANSETRON HCL 4 MG/2ML IJ SOLN
INTRAMUSCULAR | Status: DC | PRN
  Administered 2024-02-07: 4 mg via INTRAVENOUS

## 2024-02-07 MED ORDER — BUPIVACAINE-EPINEPHRINE (PF) 0.25% -1:200000 IJ SOLN
INTRAMUSCULAR | Status: AC
  Filled 2024-02-07: qty 30

## 2024-02-07 MED ORDER — CEFAZOLIN SODIUM 1 G IJ SOLR
INTRAMUSCULAR | Status: AC
Start: 2024-02-07 — End: 2024-02-07
  Filled 2024-02-07: qty 30

## 2024-02-07 MED ORDER — LIDOCAINE HCL (CARDIAC) PF 100 MG/5ML IV SOSY
PREFILLED_SYRINGE | INTRAVENOUS | Status: DC | PRN
  Administered 2024-02-07: 80 mg via INTRAVENOUS

## 2024-02-07 MED ORDER — PROPOFOL 10 MG/ML IV BOLUS
INTRAVENOUS | Status: AC
  Filled 2024-02-07: qty 20

## 2024-02-07 MED ORDER — ALBUMIN HUMAN 5 % IV SOLN: INTRAVENOUS | Status: DC | PRN

## 2024-02-07 MED ORDER — BUPIVACAINE HCL (PF) 0.25 % IJ SOLN
INTRAMUSCULAR | Status: AC
  Filled 2024-02-07: qty 30

## 2024-02-07 MED ORDER — MIDAZOLAM HCL 2 MG/2ML IJ SOLN
INTRAMUSCULAR | Status: DC | PRN
  Administered 2024-02-07: 2 mg via INTRAVENOUS

## 2024-02-07 MED ORDER — DEXAMETHASONE SODIUM PHOSPHATE 10 MG/ML IJ SOLN
INTRAMUSCULAR | Status: AC
  Filled 2024-02-07: qty 1

## 2024-02-07 MED ORDER — ONDANSETRON HCL 4 MG/2ML IJ SOLN
INTRAMUSCULAR | Status: AC
  Filled 2024-02-07: qty 2

## 2024-02-07 MED ORDER — LIDOCAINE 2% (20 MG/ML) 5 ML SYRINGE
INTRAMUSCULAR | Status: AC
  Filled 2024-02-07: qty 5

## 2024-02-07 MED ORDER — LACTATED RINGERS IV SOLN: INTRAVENOUS | Status: DC | PRN

## 2024-02-07 MED ORDER — PHENYLEPHRINE HCL-NACL 20-0.9 MG/250ML-% IV SOLN
INTRAVENOUS | Status: AC
  Filled 2024-02-07: qty 250

## 2024-02-07 MED ORDER — CEFAZOLIN SODIUM 10 G IJ SOLR
INTRAMUSCULAR | Status: DC | PRN
  Administered 2024-02-07: 3 g via INTRAVENOUS

## 2024-02-07 MED ORDER — PHENYLEPHRINE HCL-NACL 20-0.9 MG/250ML-% IV SOLN
INTRAVENOUS | Status: DC | PRN
  Administered 2024-02-07: 25 ug/min via INTRAVENOUS

## 2024-02-07 MED ORDER — SUCCINYLCHOLINE CHLORIDE 200 MG/10ML IV SOSY
PREFILLED_SYRINGE | INTRAVENOUS | Status: AC
  Filled 2024-02-07: qty 10

## 2024-02-07 MED ORDER — SODIUM CHLORIDE 0.9 % IR SOLN
Status: DC | PRN
  Administered 2024-02-07: 1000 mL

## 2024-02-07 MED ORDER — PROPOFOL 10 MG/ML IV BOLUS
INTRAVENOUS | Status: DC | PRN
Start: 2024-02-07 — End: 2024-02-08
  Administered 2024-02-07: 200 mg via INTRAVENOUS

## 2024-02-07 MED ORDER — FENTANYL CITRATE (PF) 250 MCG/5ML IJ SOLN
INTRAMUSCULAR | Status: AC
  Filled 2024-02-07: qty 5

## 2024-02-07 MED ORDER — SUCCINYLCHOLINE 20MG/ML (10ML) SYRINGE FOR MEDFUSION PUMP - OPTIME
INTRAMUSCULAR | Status: DC | PRN
  Administered 2024-02-07: 200 mg via INTRAVENOUS

## 2024-02-07 SURGICAL SUPPLY — 42 items
BAG COUNTER SPONGE SURGICOUNT (BAG) ×2 IMPLANT
BLADE CLIPPER SURG (BLADE) IMPLANT
CANISTER SUCTION 3000ML PPV (SUCTIONS) IMPLANT
CHLORAPREP W/TINT 26 (MISCELLANEOUS) ×2 IMPLANT
COVER SURGICAL LIGHT HANDLE (MISCELLANEOUS) ×2 IMPLANT
DERMABOND ADVANCED .7 DNX12 (GAUZE/BANDAGES/DRESSINGS) ×2 IMPLANT
DEVICE SECURE STRAP 25 ABSORB (INSTRUMENTS) IMPLANT
DEVICE TROCAR PUNCTURE CLOSURE (ENDOMECHANICALS) ×2 IMPLANT
ELECTRODE REM PT RTRN 9FT ADLT (ELECTROSURGICAL) ×2 IMPLANT
GLOVE BIOGEL PI IND STRL 6 (GLOVE) ×2 IMPLANT
GLOVE BIOGEL PI MICRO STRL 5.5 (GLOVE) ×2 IMPLANT
GLOVE SURG SYN 5.5 (GLOVE) IMPLANT
GLOVE SURG SYN 5.5 PF PI (GLOVE) IMPLANT
GOWN STRL REUS W/ TWL LRG LVL3 (GOWN DISPOSABLE) ×6 IMPLANT
GRASPER SUT TROCAR 14GX15 (MISCELLANEOUS) ×2 IMPLANT
IRRIGATION SUCT STRKRFLW 2 WTP (MISCELLANEOUS) IMPLANT
KIT BASIN OR (CUSTOM PROCEDURE TRAY) ×2 IMPLANT
KIT TURNOVER KIT B (KITS) ×2 IMPLANT
MARKER SKIN DUAL TIP RULER LAB (MISCELLANEOUS) ×2 IMPLANT
NDL INSUFFLATION 14GA 120MM (NEEDLE) ×2 IMPLANT
NDL SPNL 22GX3.5 QUINCKE BK (NEEDLE) ×2 IMPLANT
NEEDLE INSUFFLATION 14GA 120MM (NEEDLE) ×2 IMPLANT
NEEDLE SPNL 22GX3.5 QUINCKE BK (NEEDLE) ×1 IMPLANT
NS IRRIG 1000ML POUR BTL (IV SOLUTION) ×2 IMPLANT
PAD ARMBOARD POSITIONER FOAM (MISCELLANEOUS) ×4 IMPLANT
SCISSORS LAP 5X35 DISP (ENDOMECHANICALS) ×2 IMPLANT
SET TUBE SMOKE EVAC HIGH FLOW (TUBING) ×2 IMPLANT
SHEARS HARMONIC ACE PLUS 36CM (ENDOMECHANICALS) IMPLANT
SLEEVE Z-THREAD 5X100MM (TROCAR) ×2 IMPLANT
SUT MNCRL AB 4-0 PS2 18 (SUTURE) ×2 IMPLANT
SUT NOVA NAB DX-16 0-1 5-0 T12 (SUTURE) ×2 IMPLANT
SUT SILK 2 0 SH (SUTURE) IMPLANT
SUT VIC AB 0 CT1 27XBRD ANBCTR (SUTURE) IMPLANT
SUT VICRYL 0 UR6 27IN ABS (SUTURE) IMPLANT
TOWEL GREEN STERILE (TOWEL DISPOSABLE) ×2 IMPLANT
TOWEL GREEN STERILE FF (TOWEL DISPOSABLE) ×2 IMPLANT
TRAY FOLEY MTR SLVR 14FR STAT (SET/KITS/TRAYS/PACK) IMPLANT
TRAY LAPAROSCOPIC MC (CUSTOM PROCEDURE TRAY) ×2 IMPLANT
TROCAR 11X100 Z THREAD (TROCAR) IMPLANT
TROCAR Z-THREAD OPTICAL 5X100M (TROCAR) ×2 IMPLANT
WARMER LAPAROSCOPE (MISCELLANEOUS) ×2 IMPLANT
WATER STERILE IRR 1000ML POUR (IV SOLUTION) ×2 IMPLANT

## 2024-02-07 NOTE — Anesthesia Procedure Notes (Signed)
 Procedure Name: Intubation Date/Time: 02/07/2024 10:50 PM  Performed by: Vaughn Zebedee HERO, CRNAPre-anesthesia Checklist: Patient identified, Emergency Drugs available, Suction available and Patient being monitored Patient Re-evaluated:Patient Re-evaluated prior to induction Oxygen Delivery Method: Circle system utilized Preoxygenation: Pre-oxygenation with 100% oxygen Induction Type: IV induction, Rapid sequence and Cricoid Pressure applied Laryngoscope Size: Glidescope and 4 Grade View: Grade I Tube type: Oral Tube size: 7.5 mm Number of attempts: 1 Airway Equipment and Method: Stylet Placement Confirmation: ETT inserted through vocal cords under direct vision, positive ETCO2 and breath sounds checked- equal and bilateral Secured at: 24 cm Tube secured with: Tape Dental Injury: Teeth and Oropharynx as per pre-operative assessment

## 2024-02-07 NOTE — ED Provider Notes (Signed)
 [place] EMERGENCY DEPARTMENT AT [facility] Provider Note   CSN: [id number] Arrival date & time: 02/07/24  2107     Patient presents with: GI Problem and Transfer   Marcus Hutchinson is a 63 y.o. male.   HPI Patient seen on transfer from another health system.  Patient went there with nausea, vomiting, was found to have CT evidence for bowel with transition point possible internal hernia.  My evaluation the patient is awake and alert, initially with transport, then with family members.  Patient feels better with NG tube in place, though it is painful.    Prior to Admission medications   Medication Sig Start Date End Date Taking? Authorizing Provider  aspirin  EC 81 MG tablet Take 81 mg by mouth daily.    [provider]  buPROPion  (WELLBUTRIN  XL) 150 MG 24 hr tablet Take 150 mg by mouth daily. 02/07/16   [provider]  Calcium Carbonate (SM CALCIUM 600 PO) Take 600 mg by mouth 3 (three) times daily.    [provider]  colchicine 0.6 MG tablet Take 1 tablet by mouth daily. 05/12/22   [provider]  enoxaparin  (LOVENOX ) 60 MG/0.6ML injection Inject 2.05 mLs (205 mg total) into the skin every 12 (twelve) hours for 28 days. 05/14/21 06/11/21  Stechschulte, Deward PARAS, MD  fluticasone  (FLONASE ) 50 MCG/ACT nasal spray Place 2 sprays into both nostrils daily as needed for allergies. 08/13/20   [provider]  gabapentin  (NEURONTIN ) 300 MG capsule Take 300 mg by mouth 2 (two) times daily. 09/12/20   [provider]  HYDROcodone -acetaminophen  (NORCO/VICODIN) 5-325 MG tablet Take 1 tablet by mouth every 4 (four) hours as needed. 05/13/22   Dean Clarity, MD  methocarbamol  (ROBAXIN ) 500 MG tablet Take 1 tablet by mouth 2 (two) times daily as needed for muscle spasms. 12/13/20   [provider]  metoprolol  succinate (TOPROL -XL) 50 MG 24 hr tablet Take 50 mg by mouth daily. 09/12/20   [provider]  Multiple  Vitamins-Minerals (BARIATRIC FUSION PO) Take 1 tablet by mouth daily.    [provider]  omeprazole  (PRILOSEC) 20 MG capsule Take 1 capsule (20 mg total) by mouth daily. Patient not taking: Reported on 05/13/2022 02/12/21   Mansouraty, Aloha Raddle., MD  ondansetron  (ZOFRAN -ODT) 4 MG disintegrating tablet Take 1 tablet (4 mg total) by mouth every 8 (eight) hours as needed. 05/13/22   Dean Clarity, MD  oxyCODONE  (OXY IR/ROXICODONE ) 5 MG immediate release tablet Take 1 tablet (5 mg total) by mouth every 6 (six) hours as needed for severe pain. Patient not taking: Reported on 05/13/2022 05/14/21   Stechschulte, Deward PARAS, MD  pantoprazole  (PROTONIX ) 40 MG tablet Take 1 tablet (40 mg total) by mouth daily. Patient not taking: Reported on 05/13/2022 05/14/21   Stechschulte, Deward PARAS, MD  Propylene Glycol (SYSTANE COMPLETE) 0.6 % SOLN Place 1 drop into both eyes daily as needed (dry eyes).    [provider]  tamsulosin  (FLOMAX ) 0.4 MG CAPS capsule Take 1 capsule (0.4 mg total) by mouth daily. 05/13/22   Dean Clarity, MD  Vitamin D , Ergocalciferol , (DRISDOL ) 1.25 MG (50000 UNIT) CAPS capsule Take 50,000 Units by mouth 2 (two) times a week. Mon, Thursday 09/12/20   [provider]    Allergies: Tape    Review of Systems  Updated Vital Signs BP 127/68   Pulse (!) 58   Temp (!) 97.5 F (36.4 C) (Temporal)   Resp 14   Ht 6' 1 (  1.854 m)   Wt (!) 140.6 kg   SpO2 98%   BMI 40.90 kg/m   Physical Exam Vitals and nursing note reviewed.  Constitutional:      General: Marcus Hutchinson is not in acute distress.    Appearance: Marcus Hutchinson is well-developed.  HENT:     Head: Normocephalic and atraumatic.    Eyes:     Conjunctiva/sclera: Conjunctivae normal.    Cardiovascular:     Rate and Rhythm: Normal rate and regular rhythm.  Pulmonary:     Effort: Pulmonary effort is normal.   Skin:    General: Skin is warm and dry.   Neurological:     Mental Status: Marcus Hutchinson is alert and oriented to person,  place, and time.     (all labs ordered are listed, but only abnormal results are displayed) Labs Reviewed - No data to display  EKG: None  Radiology: No results found.   Procedures   Medications Ordered in the ED - No data to display                                  Medical Decision Making Patient with CT evidence for bowel obstruction presents as a transfer for our surgical team.  Patient is awake, alert, speaking clearly, has improvement of his nausea with NG tube in place.  I discussed this case with our surgical colleagues, patient will be admitted for bowel obstruction.  Amount and/or Complexity of Data Reviewed Independent Historian:     Details: Transport team  Risk Decision regarding hospitalization.   Final diagnoses:  SBO (small bowel obstruction) (HCC)     Marcus Charleston, MD 02/07/24 2137

## 2024-02-07 NOTE — ED Triage Notes (Addendum)
 Pt arrived via [facility] from [facility] for surgery consult for small bowel obstruction. Pt was walk in admit for abdominal pain, chest pain. NG tube placed, 20 RAC. A&Ox4, GCS 15. Pt received 4mg  morphine , 8mg  Zofran  prior to transport. Vital signs stable in route.   EMS report that pt was set to be transferred to [facility] when they left [facility], they were contacted by their dispatch to change location to bring patient to [facility] instead. MD Garrick notified.   BP 122/75 HR 55 Spo2 95% RA RR 16

## 2024-02-07 NOTE — ED Notes (Signed)
 Patient able to void with no issues or complaints, states that he had urinated prior to transport.

## 2024-02-07 NOTE — ED Notes (Addendum)
 Surgeon, Dr. Dasie at bedside.

## 2024-02-07 NOTE — Anesthesia Preprocedure Evaluation (Addendum)
 Anesthesia Evaluation  Patient identified by MRN, date of birth, ID band Patient awake    Reviewed: Allergy & Precautions, NPO status , Patient's Chart, lab work & pertinent test results  History of Anesthesia Complications Negative for: history of anesthetic complications  Airway Mallampati: III  TM Distance: >3 FB Neck ROM: Full    Dental  (+) Dental Advisory Given   Pulmonary sleep apnea , former smoker   breath sounds clear to auscultation       Cardiovascular hypertension, Pt. on medications and Pt. on home beta blockers  Rhythm:Regular     Neuro/Psych  PSYCHIATRIC DISORDERS Anxiety Depression       GI/Hepatic   Endo/Other    Class 3 obesity  Renal/GU Renal InsufficiencyRenal disease     Musculoskeletal  (+) Arthritis ,    Abdominal   Peds  Hematology negative hematology ROS (+)   Anesthesia Other Findings   Reproductive/Obstetrics                              Anesthesia Physical Anesthesia Plan  ASA: 3 and emergent  Anesthesia Plan: General   Post-op Pain Management: Ofirmev  IV (intra-op)*   Induction: Intravenous, Rapid sequence and Cricoid pressure planned  PONV Risk Score and Plan: 3 and Ondansetron , Dexamethasone  and Midazolam   Airway Management Planned: Oral ETT  Additional Equipment: None  Intra-op Plan:   Post-operative Plan: Extubation in OR  Informed Consent: I have reviewed the patients History and Physical, chart, labs and discussed the procedure including the risks, benefits and alternatives for the proposed anesthesia with the patient or authorized representative who has indicated his/her understanding and acceptance.     Dental advisory given  Plan Discussed with: CRNA and Surgeon  Anesthesia Plan Comments:          Anesthesia Quick Evaluation

## 2024-02-07 NOTE — ED Notes (Signed)
 Rainbow sent down to main lab will call when blood orders placed

## 2024-02-07 NOTE — H&P (Signed)
 Marcus Hutchinson [birth year]  [id number].     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 Lyndel. He presented to the ED at [facility] today with abdominal pain, which started around 3pm today. He has also had nausea, vomiting and sweating. His labs showed a mildly elevated lactate of 3.3. A CT scan showed small bowel dilation and SBO, with suspicion for an internal hernia. An NG tube was placed and he was transferred to [facility]. He continues to have pain across the upper abdomen. He is hemodynamically stable.   His only prior abdominal surgery in addition to the robotic gastric bypass is a lap chole.  ROS: Review of Systems  Constitutional:  Negative for chills and fever.  Respiratory:  Negative for shortness of breath.   Gastrointestinal:  Positive for abdominal pain, nausea and vomiting.    Family History  Problem Relation Age of Onset   Arthritis Mother    Diabetes Mother    Heart disease Father        stent placement   Heart attack Father    Stroke Maternal Uncle    Heart attack Paternal Uncle    Heart failure Maternal Grandfather    Colon cancer Neg Hx    Esophageal cancer Neg Hx    Inflammatory bowel disease Neg Hx    Liver disease Neg Hx    Pancreatic cancer Neg Hx    Rectal cancer Neg Hx    Stomach cancer Neg Hx     Past Medical History:  Diagnosis Date   Anxiety    Arthritis    osteoarthritis, spinal stenosis   Depression    History of kidney stones    x 3 episodes- none in awhile.   Hypertension    Sleep apnea    cpap use pressure 17    Past Surgical History:  Procedure Laterality Date   BACK SURGERY     BIOPSY  12/24/2020   Procedure: BIOPSY;  Surgeon: Wilhelmenia Aloha Raddle., MD;  Location: [facility];  Service: Gastroenterology;;   CARDIAC CATHETERIZATION     10 yrs ago-normal   CHOLECYSTECTOMY     laparoscopic   COLONOSCOPY WITH PROPOFOL  N/A 12/24/2020   Procedure: COLONOSCOPY WITH  PROPOFOL ;  Surgeon: Wilhelmenia Aloha Raddle., MD;  Location: [facility];  Service: Gastroenterology;  Laterality: N/A;   ESOPHAGOGASTRODUODENOSCOPY (EGD) WITH PROPOFOL  N/A 12/24/2020   Procedure: ESOPHAGOGASTRODUODENOSCOPY (EGD) WITH PROPOFOL ;  Surgeon: Wilhelmenia Aloha Raddle., MD;  Location: [facility];  Service: Gastroenterology;  Laterality: N/A;   FINGER SURGERY Right    little finger tip reattachment(slight decrease sensation).   HEMOSTASIS CLIP PLACEMENT  12/24/2020   Procedure: HEMOSTASIS CLIP PLACEMENT;  Surgeon: Wilhelmenia Aloha Raddle., MD;  Location: [facility];  Service: Gastroenterology;;   HERNIA REPAIR Left    LIH(left inguinal Hernia) repair   JOINT REPLACEMENT     LUMBAR LAMINECTOMY Bilateral 07/12/2018   Lumbar four-five Laminotomy/Foraminotomy   LUMBAR LAMINECTOMY/DECOMPRESSION MICRODISCECTOMY Bilateral 07/12/2018   Procedure: Bilateral Lumbar four-five Laminotomy/Foraminotomy;  Surgeon: Colon Shove, MD;  Location: [facility];  Service: Neurosurgery;  Laterality: Bilateral;   MOLE REMOVAL Left 07/01/2018   shoulder   MYRINGOTOMY Right 10/2017   POLYPECTOMY  12/24/2020   Procedure: POLYPECTOMY;  Surgeon: Mansouraty, Aloha Raddle., MD;  Location: [facility];  Service: Gastroenterology;;   SHOULDER ARTHROSCOPY DISTAL CLAVICLE EXCISION AND OPEN ROTATOR CUFF REPAIR Right    '09   TOTAL HIP ARTHROPLASTY Left 11/07/2015   Procedure:  LEFT TOTAL HIP ARTHROPLASTY POSTERIOR  APPROACH;  Surgeon: Dempsey Moan, MD;  Location: [facility];  Service: Orthopedics;  Laterality: Left;   TOTAL HIP ARTHROPLASTY Right 2011   TOTAL HIP ARTHROPLASTY Bilateral     Social History:  reports that he quit smoking about 39 years ago. His smoking use included cigarettes. He started smoking about 44 years ago. He has a 3.5 pack-year smoking history. He has never used smokeless tobacco. He reports that he does not drink alcohol and does not use drugs.  Allergies:  Allergies  Allergen Reactions   Tape  Other (See Comments)    Blisters skin- paper tape seems to be ok **ADHESIVE**    (Not in a hospital admission)    Physical Exam: Blood pressure 127/68, pulse (!) 58, temperature (!) 97.5 F (36.4 C), temperature source Temporal, resp. rate 14, height 6' 1 (1.854 m), weight (!) 140.6 kg, SpO2 98%. General: resting comfortably, appears stated age, no apparent distress Neurological: alert and oriented, no focal deficits, cranial nerves grossly in tact HEENT: normocephalic, atraumatic CV: regular rate and rhythm, extremities warm and well-perfused Respiratory: normal work of breathing on room air Abdomen: soft, nondistended, mildly tender to palpation in epigastric area. Extremities: warm and well-perfused, no deformities, moving all extremities spontaneously Psychiatric: normal mood and affect Skin: warm and dry, no jaundice, no rashes or lesions   No results found for this or any previous visit (from the past 48 hours). No results found.    Assessment/Plan 63 yo male with a history of RYGB 3 years ago, presenting with abdominal pain and likely internal hernia. I personally reviewed his outside labs and imaging. There is small bowel in the LUQ with a mesenteric swirl, consistent with an internal hernia. Will proceed to the OR tonight for diagnostic laparoscopy and internal hernia repair, with possible exploratory laparotomy. I reviewed the procedure details with the patient, including the benefits and risks. He expressed understanding and agrees to proceed with surgery. All questions were answered and informed consent obtained. Will be admitted postoperatively.   Leonor Dawn, MD [facility] General, Hepatobiliary and Pancreatic Surgery 02/07/24 10:05 PM

## 2024-02-08 ENCOUNTER — Encounter (HOSPITAL_COMMUNITY): Payer: Self-pay | Admitting: Surgery

## 2024-02-08 DIAGNOSIS — E66813 Obesity, class 3: Secondary | ICD-10-CM | POA: Diagnosis present

## 2024-02-08 DIAGNOSIS — Z8261 Family history of arthritis: Secondary | ICD-10-CM | POA: Diagnosis not present

## 2024-02-08 DIAGNOSIS — E872 Acidosis, unspecified: Secondary | ICD-10-CM | POA: Diagnosis present

## 2024-02-08 DIAGNOSIS — Z9049 Acquired absence of other specified parts of digestive tract: Secondary | ICD-10-CM | POA: Diagnosis not present

## 2024-02-08 DIAGNOSIS — I10 Essential (primary) hypertension: Secondary | ICD-10-CM | POA: Diagnosis present

## 2024-02-08 DIAGNOSIS — Z8249 Family history of ischemic heart disease and other diseases of the circulatory system: Secondary | ICD-10-CM | POA: Diagnosis not present

## 2024-02-08 DIAGNOSIS — F32A Depression, unspecified: Secondary | ICD-10-CM | POA: Diagnosis present

## 2024-02-08 DIAGNOSIS — Z7901 Long term (current) use of anticoagulants: Secondary | ICD-10-CM | POA: Diagnosis not present

## 2024-02-08 DIAGNOSIS — Z87891 Personal history of nicotine dependence: Secondary | ICD-10-CM | POA: Diagnosis not present

## 2024-02-08 DIAGNOSIS — M1990 Unspecified osteoarthritis, unspecified site: Secondary | ICD-10-CM | POA: Diagnosis present

## 2024-02-08 DIAGNOSIS — F419 Anxiety disorder, unspecified: Secondary | ICD-10-CM | POA: Diagnosis present

## 2024-02-08 DIAGNOSIS — K458 Other specified abdominal hernia without obstruction or gangrene: Secondary | ICD-10-CM | POA: Diagnosis present

## 2024-02-08 DIAGNOSIS — K460 Unspecified abdominal hernia with obstruction, without gangrene: Secondary | ICD-10-CM | POA: Diagnosis present

## 2024-02-08 DIAGNOSIS — Z7982 Long term (current) use of aspirin: Secondary | ICD-10-CM | POA: Diagnosis not present

## 2024-02-08 DIAGNOSIS — Z9109 Other allergy status, other than to drugs and biological substances: Secondary | ICD-10-CM | POA: Diagnosis not present

## 2024-02-08 DIAGNOSIS — Z96643 Presence of artificial hip joint, bilateral: Secondary | ICD-10-CM | POA: Diagnosis present

## 2024-02-08 DIAGNOSIS — Z79899 Other long term (current) drug therapy: Secondary | ICD-10-CM | POA: Diagnosis not present

## 2024-02-08 DIAGNOSIS — R112 Nausea with vomiting, unspecified: Secondary | ICD-10-CM | POA: Diagnosis present

## 2024-02-08 DIAGNOSIS — Z833 Family history of diabetes mellitus: Secondary | ICD-10-CM | POA: Diagnosis not present

## 2024-02-08 DIAGNOSIS — Z9884 Bariatric surgery status: Secondary | ICD-10-CM | POA: Diagnosis not present

## 2024-02-08 DIAGNOSIS — G4730 Sleep apnea, unspecified: Secondary | ICD-10-CM | POA: Diagnosis present

## 2024-02-08 DIAGNOSIS — Z6841 Body Mass Index (BMI) 40.0 and over, adult: Secondary | ICD-10-CM | POA: Diagnosis not present

## 2024-02-08 LAB — BASIC METABOLIC PANEL WITH GFR
Anion gap: 11 (ref 5–15)
BUN: 11 mg/dL (ref 8–23)
CO2: 21 mmol/L — ABNORMAL LOW (ref 22–32)
Calcium: 8.8 mg/dL — ABNORMAL LOW (ref 8.9–10.3)
Chloride: 105 mmol/L (ref 98–111)
Creatinine, Ser: 1.3 mg/dL — ABNORMAL HIGH (ref 0.61–1.24)
GFR, Estimated: >60 mL/min (ref >60)
Glucose, Bld: 192 mg/dL — ABNORMAL HIGH (ref 70–99)
Potassium: 3.9 mmol/L (ref 3.5–5.1)
Sodium: 137 mmol/L (ref 135–145)

## 2024-02-08 MED ORDER — BARIATRIC FUSION PO CHEW: CHEWABLE_TABLET | Freq: Every day | ORAL | Status: DC

## 2024-02-08 MED ORDER — ACETAMINOPHEN 10 MG/ML IV SOLN
INTRAVENOUS | Status: AC
  Filled 2024-02-08: qty 100

## 2024-02-08 MED ORDER — DEXTROSE IN LACTATED RINGERS 5 % IV SOLN: INTRAVENOUS | Status: AC

## 2024-02-08 MED ORDER — SUGAMMADEX SODIUM 200 MG/2ML IV SOLN
INTRAVENOUS | Status: DC | PRN
  Administered 2024-02-08: 300 mg via INTRAVENOUS

## 2024-02-08 MED ORDER — ACETAMINOPHEN 10 MG/ML IV SOLN: 1000.0000 mg | Freq: Once | INTRAVENOUS | Status: DC | PRN

## 2024-02-08 MED ORDER — ACETAMINOPHEN 10 MG/ML IV SOLN
INTRAVENOUS | Status: DC | PRN
  Administered 2024-02-08: 1000 mg via INTRAVENOUS

## 2024-02-08 MED ORDER — BUPIVACAINE-EPINEPHRINE 0.25% -1:200000 IJ SOLN
INTRAMUSCULAR | Status: DC | PRN
  Administered 2024-02-08: 20 mL

## 2024-02-08 MED ORDER — DIPHENHYDRAMINE HCL 12.5 MG/5ML PO ELIX
12.5000 mg | ORAL_SOLUTION | Freq: Four times a day (QID) | ORAL | Status: DC | PRN
Start: 2024-02-08 — End: 2024-02-09

## 2024-02-08 MED ORDER — ACETAMINOPHEN 500 MG PO TABS
1000.0000 mg | ORAL_TABLET | Freq: Three times a day (TID) | ORAL | Status: DC
  Administered 2024-02-08 – 2024-02-09 (×4): 1000 mg via ORAL
  Filled 2024-02-08 (×4): qty 2

## 2024-02-08 MED ORDER — DIPHENHYDRAMINE HCL 50 MG/ML IJ SOLN: 12.5000 mg | Freq: Four times a day (QID) | INTRAMUSCULAR | Status: DC | PRN

## 2024-02-08 MED ORDER — OXYCODONE HCL 5 MG PO TABS: 5.0000 mg | ORAL_TABLET | ORAL | Status: DC | PRN

## 2024-02-08 MED ORDER — VITAMIN D (ERGOCALCIFEROL) 1.25 MG (50000 UNIT) PO CAPS
50000.0000 [IU] | ORAL_CAPSULE | ORAL | Status: DC
  Administered 2024-02-08: 50000 [IU] via ORAL
  Filled 2024-02-08: qty 1

## 2024-02-08 MED ORDER — PANTOPRAZOLE SODIUM 40 MG PO TBEC
40.0000 mg | DELAYED_RELEASE_TABLET | Freq: Every day | ORAL | Status: DC
  Administered 2024-02-08 – 2024-02-09 (×2): 40 mg via ORAL
  Filled 2024-02-08 (×2): qty 1

## 2024-02-08 MED ORDER — ADULT MULTIVITAMIN W/MINERALS CH
1.0000 | ORAL_TABLET | Freq: Every day | ORAL | Status: DC
  Administered 2024-02-08 – 2024-02-09 (×2): 1 via ORAL
  Filled 2024-02-08 (×2): qty 1

## 2024-02-08 MED ORDER — ONDANSETRON HCL 4 MG/2ML IJ SOLN: 4.0000 mg | Freq: Four times a day (QID) | INTRAMUSCULAR | Status: DC | PRN

## 2024-02-08 MED ORDER — HYDROMORPHONE HCL 1 MG/ML IJ SOLN: 0.5000 mg | INTRAMUSCULAR | Status: DC | PRN

## 2024-02-08 MED ORDER — OXYCODONE HCL 5 MG/5ML PO SOLN: 5.0000 mg | Freq: Once | ORAL | Status: DC | PRN

## 2024-02-08 MED ORDER — BUPROPION HCL ER (XL) 150 MG PO TB24
150.0000 mg | ORAL_TABLET | Freq: Every day | ORAL | Status: DC
  Administered 2024-02-08 – 2024-02-09 (×2): 150 mg via ORAL
  Filled 2024-02-08 (×2): qty 1

## 2024-02-08 MED ORDER — FENTANYL CITRATE (PF) 100 MCG/2ML IJ SOLN: 25.0000 ug | INTRAMUSCULAR | Status: DC | PRN

## 2024-02-08 MED ORDER — ENOXAPARIN SODIUM 40 MG/0.4ML IJ SOSY
40.0000 mg | PREFILLED_SYRINGE | Freq: Every day | INTRAMUSCULAR | Status: DC
  Administered 2024-02-08 – 2024-02-09 (×2): 40 mg via SUBCUTANEOUS
  Filled 2024-02-08 (×2): qty 0.4

## 2024-02-08 MED ORDER — HYDROMORPHONE HCL 1 MG/ML IJ SOLN: 0.2500 mg | INTRAMUSCULAR | Status: DC | PRN

## 2024-02-08 MED ORDER — METHOCARBAMOL 500 MG PO TABS: 500.0000 mg | ORAL_TABLET | Freq: Four times a day (QID) | ORAL | Status: DC | PRN

## 2024-02-08 MED ORDER — DOCUSATE SODIUM 100 MG PO CAPS
100.0000 mg | ORAL_CAPSULE | Freq: Two times a day (BID) | ORAL | Status: DC
  Administered 2024-02-08 – 2024-02-09 (×3): 100 mg via ORAL
  Filled 2024-02-08 (×3): qty 1

## 2024-02-08 MED ORDER — OXYCODONE HCL 5 MG PO TABS: 5.0000 mg | ORAL_TABLET | Freq: Once | ORAL | Status: DC | PRN

## 2024-02-08 NOTE — Plan of Care (Signed)
  Problem: Education: Goal: Knowledge of General Education information will improve Description: Including pain rating scale, medication(s)/side effects and non-pharmacologic comfort measures Outcome: Progressing   Problem: Health Behavior/Discharge Planning: Goal: Ability to manage health-related needs will improve Outcome: Progressing   Problem: Clinical Measurements: Goal: Respiratory complications will improve Outcome: Progressing   Problem: Activity: Goal: Risk for activity intolerance will decrease Outcome: Progressing   Problem: Nutrition: Goal: Adequate nutrition will be maintained Outcome: Progressing   Problem: Pain Managment: Goal: General experience of comfort will improve and/or be controlled Outcome: Progressing   Problem: Safety: Goal: Ability to remain free from injury will improve Outcome: Progressing

## 2024-02-08 NOTE — Progress Notes (Signed)
 1 Day Post-Op  Subjective: Patient states that the postop pain has been well-managed overall.  States that he has been tolerating his clear liquid diet without nausea or vomiting.  Patient denies having a bowel movement but has endorsed passing flatus.  Patient had some concern about restarting some of his home meds.  Explained that we will proceed with this cautiously awaiting full restart of bowel functions.   Objective: Vital signs in last 24 hours: Temp:  [97.5 F (36.4 C)-98.3 F (36.8 C)] 98.3 F (36.8 C) (06/30 0746) Pulse Rate:  [58-78] 70 (06/30 0746) Resp:  [11-18] 18 (06/30 0746) BP: (127-153)/(68-91) 146/85 (06/30 0746) SpO2:  [97 %-100 %] 97 % (06/30 0746) Weight:  [140.6 kg] 140.6 kg (06/29 2118)    Intake/Output from previous day: 06/29 0701 - 06/30 0700 In: 1581.8 [I.V.:1061.8; IV Piggyback:520] Out: 115 [Urine:100; Blood:15] Intake/Output this shift: Total I/O In: -  Out: 300 [Urine:300]  PE: Physical Exam Constitutional:      General: He is not in acute distress.  Cardiovascular:     Rate and Rhythm: Normal rate.  Pulmonary:     Effort: Pulmonary effort is normal.  Abdominal:     General: There is no distension.     Palpations: Abdomen is soft.     Tenderness: There is no guarding.   Skin:    General: Skin is warm and dry.   Neurological:     General: No focal deficit present.     Mental Status: He is alert and oriented to person, place, and time.     Lab Results:  No results for input(s): WBC, HGB, HCT, PLT in the last 72 hours. BMET Recent Labs    02/08/24 0702  NA 137  K 3.9  CL 105  CO2 21*  GLUCOSE 192*  BUN 11  CREATININE 1.30*  CALCIUM 8.8*   PT/INR No results for input(s): LABPROT, INR in the last 72 hours. CMP     Component Value Date/Time   NA 137 02/08/2024 0702   K 3.9 02/08/2024 0702   CL 105 02/08/2024 0702   CO2 21 (L) 02/08/2024 0702   GLUCOSE 192 (H) 02/08/2024 0702   BUN 11 02/08/2024 0702    CREATININE 1.30 (H) 02/08/2024 0702   CALCIUM 8.8 (L) 02/08/2024 0702   PROT 6.6 05/23/2021 1218   ALBUMIN 2.9 (L) 05/23/2021 1218   AST 25 05/23/2021 1218   ALT 35 05/23/2021 1218   ALKPHOS 44 05/23/2021 1218   BILITOT 0.9 05/23/2021 1218   GFRNONAA >60 02/08/2024 0702   GFRAA >60 11/09/2015 0353   Lipase  No results found for: LIPASE  Studies/Results: No results found.  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 Lyndel. Outside imaging showed that there was small bowel in the LUQ with a mesenteric swirl, consistent with an internal hernia.   FEN - CLD VTE - SCDs, Lovenox  ID - None  Foley - None Plan - continue pain management in a multimodal fashion, monitor labs and outcome of surgery. Check for possible post-op ileus. F/u imaging may be needed, if no return of bowel function to r/o SBO vs post-op ileus.NGT as needed for decompression.  Would restart some of his home meds with caution pending full bowel awakening.  I reviewed nursing notes,  last 24 h vitals and pain scores, last 48 h intake and output, last 24 h labs and trends, and last 24 h imaging results.  This care required moderate level of medical decision making.    LOS: 0 days    Eulah Hammonds, [facility] Surgery 02/08/2024, 9:52 AM Please see Amion for pager number during day hours 7:00am-4:30pm

## 2024-02-08 NOTE — Plan of Care (Signed)
   Problem: Clinical Measurements: Goal: Will remain free from infection Outcome: Progressing   Problem: Pain Managment: Goal: General experience of comfort will improve and/or be controlled Outcome: Progressing   Problem: Safety: Goal: Ability to remain free from injury will improve Outcome: Progressing   Problem: Skin Integrity: Goal: Risk for impaired skin integrity will decrease Outcome: Progressing

## 2024-02-08 NOTE — Progress Notes (Signed)
   02/08/24 0914  TOC Brief Assessment  Insurance and Status Reviewed  Patient has primary care physician Yes  Home environment has been reviewed spouse  Prior level of function: independent  Prior/Current Home Services No current home services  Social Drivers of Health Review SDOH reviewed no interventions necessary  Readmission risk has been reviewed Yes  Transition of care needs no transition of care needs at this time     Transition of Care Department [facility]) has reviewed patient and no TOC needs have been identified at this time. We will continue to monitor patient advancement through interdisciplinary progression rounds. If new patient transition needs arise, please place a TOC consult.

## 2024-02-08 NOTE — Op Note (Signed)
 Date: 02/07/2024  Patient: Marcus Hutchinson MRN: [id number]  Preoperative Diagnosis: Internal hernia Postoperative Diagnosis: Same  Procedure: Diagnostic laparoscopy, repair of internal hernia  Surgeon: Leonor Dawn, MD Assistant: Richerd Silversmith, MD  EBL: Minimal  Anesthesia: General endotracheal  Specimens: None  Indications: Marcus Hutchinson is a 63 yo male who underwent a laparoscopic gastric bypass 3 years ago. He presented to the ED this evening with acute onset abdominal pain. A CT scan showed a mesenteric swirl, suspicious for an internal hernia. After a discussion of the risks and benefits of surgery, he was brought urgently to the operating room for exploration.  Findings: Internal hernia of the small bowel through Petersen's space. No evidence of bowel ischemia. The hernia was reduced and Petersen's space was closed.  Procedure details: Informed consent was obtained in the preoperative area prior to the procedure. The patient was brought to the operating room and placed on the table in the supine position. General anesthesia was induced and appropriate lines and drains were placed for intraoperative monitoring. Perioperative antibiotics were administered per SCIP guidelines. The abdomen was prepped and draped in the usual sterile fashion. A pre-procedure timeout was taken verifying patient identity, surgical site and procedure to be performed.  A small skin incision was made in the left upper quadrant, the fascia was grasped and elevated, and a Veress needle was inserted through the fascia.  Intraperitoneal placement was confirmed with the saline drop test, the abdomen was insufflated, and a 5 mm Visiport was placed.  The abdomen was inspected with no evidence of visceral or vascular injury.  There were dilated loops of small bowel in the left upper quadrant.  An 11 mm port was placed in the supraumbilical position, and two 5 mm ports were placed in the right upper quadrant.  The  terminal ileum was then identified and the small bowel was run starting at the terminal ileum and working proximally.  The common channel appeared decompressed distally.  When running the small bowel proximally, it became apparent that there was twisting of the mesentery with an internal hernia through [place]phi space.  The Roux limb was identified which was antecolic.  The small bowel was then reduced through Petersen's space by pulling the bowel to the left side of the roux limb mesentery.  There was a very small serosal tear less than 1cm in length created during this reduction, which was superficial and not repaired.  Once the bowel was completely reduced, the mesentery all appeared flat and lay proper orientation.  The small bowel was then run again starting at the gastric pouch and working distally.  The JJ anastomosis was identified, and the bowel was run up to the ligament of Treitz.  The common channel was then run.  There was mild dilation of the Roux limb in the biliary limb, but more distally the common channel was decompressed.  There were no signs of bowel injury.  Petersen's space was closed using interrupted 2-0 silk sutures.  The entire small bowel was run again and there were no signs of injury. The surgical site was irrigated and appeared hemostatic.  The 11 mm port site fascia was closed with a 0 Vicryl suture using a PMI.  The remaining ports were removed and the pneumoperitoneum was evacuated.  The skin at each port site was closed with subcuticular 4 Monocryl suture.  Dermabond was applied.  The patient tolerated the procedure well with no apparent complications.  All counts were correct x2 at the end of  the procedure. The patient was extubated and taken to PACU in stable condition.  Leonor Dawn, MD 02/08/24 12:34 AM

## 2024-02-08 NOTE — Plan of Care (Signed)
  Problem: Pain Managment: Goal: General experience of comfort will improve and/or be controlled Outcome: Progressing   Problem: Safety: Goal: Ability to remain free from injury will improve Outcome: Progressing

## 2024-02-08 NOTE — Transfer of Care (Signed)
 Immediate Anesthesia Transfer of Care Note  Patient: Marcus Hutchinson  Procedure(s) Performed: LAPAROSCOPY, DIAGNOSTIC with repair internal hernia (Abdomen) REPAIR, HERNIA, internal (Abdomen)  Patient Location: PACU  Anesthesia Type:General  Level of Consciousness: sedated  Airway & Oxygen Therapy: Patient connected to face mask oxygen  Post-op Assessment: Report given to RN and Post -op Vital signs reviewed and stable  Post vital signs: Reviewed and stable  Last Vitals:  Vitals Value Taken Time  BP 141/81 02/08/24 00:34  Temp 97.8 02/08/24 00:39  Pulse 81 02/08/24 00:38  Resp 17 02/08/24 00:38  SpO2 99 % 02/08/24 00:38  Vitals shown include unfiled device data.  Last Pain:  Vitals:   02/07/24 2116  TempSrc: Temporal  PainSc:          Complications: No notable events documented.

## 2024-02-09 ENCOUNTER — Other Ambulatory Visit (HOSPITAL_COMMUNITY): Payer: Self-pay

## 2024-02-09 MED ORDER — OXYCODONE-ACETAMINOPHEN 5-325 MG PO TABS
1.0000 | ORAL_TABLET | ORAL | 0 refills | Status: AC | PRN
  Filled 2024-02-09: qty 20, 4d supply, fill #0

## 2024-02-09 NOTE — Plan of Care (Signed)

## 2024-02-09 NOTE — Discharge Instructions (Signed)
 POST OPERATIVE INSTRUCTIONS  Thinking Clearly  The anesthesia may cause you to feel different for 1 or 2 days. Do not drive, drink alcohol, or make any big decisions for at least 2 days.  Nutrition When you wake up, you will be able to drink small amounts of liquid. If you do not feel sick, you can slowly advance your diet to regular foods. Continue to drink lots of fluids, usually about 8 to 10 glasses per day. Eat a high-fiber diet so you don't strain during bowel movements. High-Fiber Foods Foods high in fiber include beans, bran cereals and whole-grain breads, peas, dried fruit (figs, apricots, and dates), raspberries, blackberries, strawberries, sweet corn, broccoli, baked potatoes with skin, plums, pears, apples, greens, and nuts. Activity Slowly increase your activity. Be sure to get up and walk every hour or so to prevent blood clots. No heavy lifting or strenuous activity for 4 weeks following surgery to prevent hernias at your incision sites It is normal to feel tired. You may need more sleep than usual.  Get your rest but make sure to get up and move around frequently to prevent blood clots and pneumonia.  Work and Return to [employer] can go back to work when you feel well enough. Discuss the timing with your surgeon. You can usually go back to school or work 1 week or less after an operation for an unruptured appendix and up to 2 weeks after a ruptured appendix. If your work requires heavy lifting or strenuous activity you need to be placed on light duty for 4 weeks following surgery. You can return to gym class, sports or other physical activities 4 weeks after surgery.  Wound Care Always wash your hands before and after touching near your incision site. Do not soak in a bathtub until cleared at your follow up appointment. You may take a shower 24 hours after surgery. A small amount of drainage from the incision is normal. If the drainage is thick and yellow or the site is  red, you may have an infection, so call your surgeon. If you have a drain in one of your incisions, it will be taken out in office when the drainage stops. Steri-Strips will fall off in 7 to 10 days or they will be removed during your first office visit. If you have dermabond glue covering over the incision, allow the glue to flake off on its own. Avoid wearing tight or rough clothing. It may rub your incisions and make it harder for them to heal. Protect the new skin, especially from the sun. The sun can burn and cause darker scarring. Your scar will heal in about 4 to 6 weeks and will become softer and continue to fade over the next year.  The cosmetic appearance of the incisions will improve over the course of the first year after surgery. Sensation around your incision will return in a few weeks or months.  Bowel Movements After intestinal surgery, you may have loose watery stools for several days. If watery diarrhea lasts longer than 3 days, contact your surgeon. Pain medication (narcotics) can cause constipation. Increase the fiber in your diet with high-fiber foods if you are constipated. You can take an over the counter stool softener like Colace to avoid constipation.  Additional over the counter medications can also be used if Colace isn't sufficient (for example, Milk of Magnesia or Miralax ).  Pain The amount of pain is different for each person. Some people need only 1 to 3  doses of pain control medication, while others need more. Take alternating doses of tylenol  and ibuprofen around the clock for the first five days following surgery.  This will provide a baseline of pain control and help with inflammation.  Take the narcotic pain medication in addition if needed for severe pain.  Contact Your Surgeon at [phone], if you have: Pain that will not go away Pain that gets worse A fever of more than 101F (38.3C) Repeated vomiting Swelling, redness, bleeding, or bad-smelling  drainage from your wound site Strong abdominal pain No bowel movement or unable to pass gas for 3 days Watery diarrhea lasting longer than 3 days  Pain Control The goal of pain control is to minimize pain, keep you moving and help you heal. Your surgical team will work with you on your pain plan. Most often a combination of therapies and medications are used to control your pain. You may also be given medication (local anesthetic) at the surgical site. This may help control your pain for several days. Extreme pain puts extra stress on your body at a time when your body needs to focus on healing. Do not wait until your pain has reached a level "10" or is unbearable before telling your doctor or nurse. It is much easier to control pain before it becomes severe. Following a laparoscopic procedure, pain is sometimes felt in the shoulder. This is due to the gas inserted into your abdomen during the procedure. Moving and walking helps to decrease the gas and the right shoulder pain.  Use the guide below for ways to manage your post-operative pain. Learn more by going to facs.org/safepaincontrol.  How Intense Is My Pain Common Therapies to Feel Better       I hardly notice my pain, and it does not interfere with my activities.  I notice my pain and it distracts me, but I can still do activities (sitting up, walking, standing).  Non-Medication Therapies  Ice (in a bag, applied over clothing at the surgical site), elevation, rest, meditation, massage, distraction (music, TV, play) walking and mild exercise Splinting the abdomen with pillows +  Non-Opioid Medications Acetaminophen  (Tylenol ) AVOID NSAIDs like Ibuprofen      My pain is hard to ignore and is more noticeable even when I rest.  My pain interferes with my usual activities.  Non-Medication Therapies  +  Non-Opioid medications  Take on a regular schedule (around-the-clock) instead of as needed. (For example, Tylenol  every 6  hours at 9:00 am, 3:00 pm, 9:00 pm, 3:00 am and Motrin every 6 hours at 12:00 am, 6:00 am, 12:00 pm, 6:00 pm)         I am focused on my pain, and I am not doing my daily activities.  I am groaning in pain, and I cannot sleep. I am unable to do anything.  My pain is as bad as it could be, and nothing else matters.  Non-Medication Therapies  +  Around-the-Clock Non-Opioid Medications  +  Short-acting opioids  Opioids should be used with other medications to manage severe pain. Opioids block pain and give a feeling of euphoria (feel high). Addiction, a serious side effect of opioids, is rare with short-term (a few days) use.  Examples of short-acting opioids include: Tramadol  (Ultram ), Hydrocodone  (Norco, Vicodin), Hydromorphone  (Dilaudid ), Oxycodone  (Oxycontin )     The above directions have been adapted from the [employer] Surgical Patient Education Program.  Please refer to the ACS website if needed: http://aguilar-moyer.com/.  Deward Foy, MD [facility] [address], [place], [place]  [address] ?  [address] [address], [place], [place]   [address] [phone] ? [phone] ? FAX [phone] Web site: [email or web address]

## 2024-02-09 NOTE — Progress Notes (Signed)
 Elspeth LOISE Holland to be D/C'd  per MD order.  Discussed with the patient and all questions fully answered.  VSS, Skin clean, dry and intact without evidence of skin break down, no evidence of skin tears noted.  IV catheter discontinued intact. Site without signs and symptoms of complications. Dressing and pressure applied.  An After Visit Summary was printed and given to the patient. Patient received medications from [facility].  D/c education completed with patient/family including follow up instructions, medication list, d/c activities limitations if indicated, with other d/c instructions as indicated by MD - patient able to verbalize understanding, all questions fully answered.   Patient instructed to return to ED, call 911, or call MD for any changes in condition.   Patient to be escorted via WC, and D/C home via private auto.

## 2024-02-09 NOTE — Discharge Summary (Signed)
 Patient ID: Marcus Hutchinson [id number] 63 y.o. [birth year]  02/07/2024  Discharge date and time: 02/09/2024  Admitting Physician: Deward PARAS Tahjai Schetter  Discharge Physician: Deward PARAS Jaeleah Smyser  Admission Diagnoses: SBO (small bowel obstruction) [facility]) [K56.609] Internal hernia [K45.8] Patient Active Problem List   Diagnosis Date Noted   Internal hernia 02/08/2024   Morbid obesity (HCC) 05/13/2021   Eructation 02/12/2021   Hx of adenomatous colonic polyps 02/12/2021   Barrett's esophagus without dysplasia 02/12/2021   Helicobacter pylori infection 02/12/2021   Abnormal endoscopy of upper gastrointestinal tract 02/12/2021   Abnormal colonoscopy 02/12/2021   Lumbar stenosis with neurogenic claudication 07/12/2018   OA (osteoarthritis) of hip 11/07/2015     Discharge Diagnoses:  Patient Active Problem List   Diagnosis Date Noted   Internal hernia 02/08/2024   Morbid obesity (HCC) 05/13/2021   Eructation 02/12/2021   Hx of adenomatous colonic polyps 02/12/2021   Barrett's esophagus without dysplasia 02/12/2021   Helicobacter pylori infection 02/12/2021   Abnormal endoscopy of upper gastrointestinal tract 02/12/2021   Abnormal colonoscopy 02/12/2021   Lumbar stenosis with neurogenic claudication 07/12/2018   OA (osteoarthritis) of hip 11/07/2015    Operations: Procedure(s): LAPAROSCOPY, DIAGNOSTIC with repair internal hernia REPAIR, HERNIA, internal  Admission Condition: good  Discharged Condition: good  Indication for Admission: Internal hernia  Hospital Course: Laparoscopic reduction of internal hernia with closure of retro-roux defect by Dr. Leonor Dawn on 02/08/24  Consults: None  Significant Diagnostic Studies: None  Treatments: surgery: As above  Disposition: Home  Patient Instructions:  Allergies as of 02/09/2024       Reactions   Tape Other (See Comments)   Blisters skin- paper tape seems to be ok **ADHESIVE**        Medication List     STOP  taking these medications    predniSONE 10 MG tablet Commonly known as: DELTASONE       TAKE these medications    allopurinol 300 MG tablet Commonly known as: ZYLOPRIM Take 300 mg by mouth daily.   aspirin  EC 81 MG tablet Take 81 mg by mouth daily.   BARIATRIC FUSION PO Take 1 tablet by mouth daily.   buPROPion  150 MG 24 hr tablet Commonly known as: WELLBUTRIN  XL Take 150 mg by mouth daily.   calcium citrate 950 (200 Ca) MG tablet Commonly known as: CALCITRATE - dosed in mg elemental calcium Take 600 mg by mouth daily.   colchicine 0.6 MG tablet Take 1 tablet by mouth daily.   gabapentin  300 MG capsule Commonly known as: NEURONTIN  Take 300 mg by mouth 2 (two) times daily.   metoprolol  succinate 50 MG 24 hr tablet Commonly known as: TOPROL -XL Take 50 mg by mouth daily.   oxyCODONE -acetaminophen  5-325 MG tablet Commonly known as: Percocet Take 1 tablet by mouth every 4 (four) hours as needed for severe pain (pain score 7-10).   pantoprazole  40 MG tablet Commonly known as: PROTONIX  Take 1 tablet (40 mg total) by mouth daily.   Systane Complete 0.6 % Soln Generic drug: Propylene Glycol Place 1 drop into both eyes daily as needed (dry eyes).   Vitamin D  (Ergocalciferol ) 1.25 MG (50000 UNIT) Caps capsule Commonly known as: DRISDOL  Take 50,000 Units by mouth 2 (two) times a week. Mon, Thursday        Activity: no heavy lifting for 4 weeks Diet: Regular bariatric diet Wound Care: keep wound clean and dry  Follow-up:  With Dr. Lyndel in 4 weeks.  Signed: Deward PARAS Jaccob Czaplicki General, Bariatric, &  Minimally Invasive Surgery [facility]   02/09/2024, 8:03 AM

## 2024-02-13 NOTE — Anesthesia Postprocedure Evaluation (Signed)
 Anesthesia Post Note  Patient: Marcus Hutchinson  Procedure(s) Performed: LAPAROSCOPY, DIAGNOSTIC with repair internal hernia (Abdomen) REPAIR, HERNIA, internal (Abdomen)     Patient location during evaluation: PACU Anesthesia Type: General Level of consciousness: awake and patient cooperative Pain management: pain level controlled Vital Signs Assessment: post-procedure vital signs reviewed and stable Respiratory status: spontaneous breathing, nonlabored ventilation and respiratory function stable Cardiovascular status: blood pressure returned to baseline and stable Postop Assessment: no apparent nausea or vomiting Anesthetic complications: no   No notable events documented.  Last Vitals:  Vitals:   02/09/24 0624 02/09/24 0909  BP: (!) 145/72 (!) 155/82  Pulse: (!) 53 (!) 52  Resp: 18 17  Temp: 36.5 C 36.6 C  SpO2: 95% 94%    Last Pain:  Vitals:   02/09/24 0909  TempSrc: Oral  PainSc:                  Thomasina Housley

## 2024-02-16 NOTE — H&P (Signed)
 Surgical History & Physical  Patient Name: Marcus Hutchinson  DOB: 30-Nov-1960  Surgery: Cataract extraction with intraocular lens implant phacoemulsification; Right Eye Surgeon: Lynwood Hermann MD Surgery Date: 02/26/2024 Pre-Op Date: 12/28/2023  HPI: A 32 Yr. old male patient 1.  The patient is here for a cataract eval Ref: Dr. Nicholaus. Pt. complains of difficulty when driving at night. Both eyes are affected. The episode is constant. Distance vision is worse than near. The complaint is associated with blurry vision. This is negatively affecting the patient's quality of life and the patient is unable to function adequately in life with the current level of vision. The vision is worse in the right eye. This is negatively affecting the patient's quality of life and the patient is unable to function adequately in life with the current level of vision. HPI Completed by Dr. Lynwood Hermann  Medical History: Cataracts  Depression, Vit. D deficiency, Neuropathy High Blood Pressure  Review of Systems Cardiovascular High Blood Pressure Neurological Neuropathy Psychiatry Depression All recorded systems are negative except as noted above.  Social Unknown if ever smoked   Medication Dry Eye Relief,  Colchicine, Metoprolol  succinate, Oseltamivir, Cefuroxime axetil, Bupropion  HCl, Prednisolone acetate, Ilevro, Moxifloxacin  Sx/Procedures Back Surgery, Gastric Bypass, Shoulder surgery, Hip Replacement  Drug Allergies NKDA  History & Physical: Heent: cataracts NECK: supple without bruits LUNGS: lungs clear to auscultation CV: regular rate and rhythm Abdomen: soft and non-tender  Impression & Plan: Assessment: 1.  COMBINED FORMS AGE RELATED CATARACT; Both Eyes (H25.813) 2.  BLEPHARITIS; Right Upper Lid, Right Lower Lid, Left Upper Lid, Left Lower Lid (H01.001, H01.002,H01.004,H01.005) 3.  DERMATOCHALASIS, no surgery; Right Upper Lid, Left Upper Lid (H02.831, H02.834) 4.  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  VITREOUS DETACHMENT PVD; Left Eye [phone])  Plan: 1.  Cataract accounts for the patient's decreased vision. This visual impairment is not correctable with a tolerable change in glasses or contact lenses. Cataract surgery with an implantation of a new lens should significantly improve the visual and functional status of the patient. Discussed all risks, benefits, alternatives, and potential complications. Discussed the procedures and recovery. Patient desires to have surgery. A-scan ordered and performed today for intra-ocular lens calculations. The surgery will be performed in order to improve vision for driving, reading, and for eye examinations. Recommend phacoemulsification with intra-ocular lens. Recommend Dextenza  for post-operative pain and inflammation. Right Eye worse - first. Dilates well - shugarcaine by protocol. Fuchs - DuoVisc.  2.  Blepharitis is present - recommend regular lid cleaning.  3.  Asymptomatic, recommend observation for now. Findings, prognosis and treatment options reviewed.  4.  Mild - monitor.  5.  Old Asymptomatic. RD precautions given. Patient to call with increase in flashing lights/floaters/dark curtain.

## 2024-02-18 DIAGNOSIS — H25811 Combined forms of age-related cataract, right eye: Secondary | ICD-10-CM | POA: Diagnosis not present

## 2024-02-19 ENCOUNTER — Encounter (HOSPITAL_COMMUNITY)
Admission: RE | Admit: 2024-02-19 | Discharge: 2024-02-19 | Disposition: A | Discharge status: Home / Self Care | Source: Ambulatory Visit | Attending: Ophthalmology | Admitting: Ophthalmology

## 2024-02-25 NOTE — Anesthesia Preprocedure Evaluation (Signed)
 Anesthesia Evaluation  Patient identified by MRN, date of birth, ID band Patient awake    Reviewed: Allergy & Precautions, NPO status , Patient's Chart, lab work & pertinent test results  History of Anesthesia Complications Negative for: history of anesthetic complications  Airway Mallampati: III  TM Distance: >3 FB Neck ROM: Full    Dental no notable dental hx. (+) Dental Advisory Given, Teeth Intact   Pulmonary sleep apnea , former smoker   Pulmonary exam normal breath sounds clear to auscultation       Cardiovascular hypertension, Pt. on medications and Pt. on home beta blockers Normal cardiovascular exam Rhythm:Regular Rate:Normal     Neuro/Psych  PSYCHIATRIC DISORDERS Anxiety Depression       GI/Hepatic Barretts   Endo/Other    Class 3 obesity  Renal/GU Renal InsufficiencyRenal disease     Musculoskeletal  (+) Arthritis ,    Abdominal   Peds  Hematology negative hematology ROS (+)   Anesthesia Other Findings   Reproductive/Obstetrics                              Anesthesia Physical Anesthesia Plan  ASA: 3  Anesthesia Plan: MAC   Post-op Pain Management: Minimal or no pain anticipated   Induction:   PONV Risk Score and Plan:   Airway Management Planned: Nasal Cannula and Natural Airway  Additional Equipment: None  Intra-op Plan:   Post-operative Plan:   Informed Consent: I have reviewed the patients History and Physical, chart, labs and discussed the procedure including the risks, benefits and alternatives for the proposed anesthesia with the patient or authorized representative who has indicated his/her understanding and acceptance.     Dental advisory given  Plan Discussed with: CRNA  Anesthesia Plan Comments:          Anesthesia Quick Evaluation

## 2024-02-26 ENCOUNTER — Ambulatory Visit (HOSPITAL_COMMUNITY): Admitting: Anesthesiology

## 2024-02-26 ENCOUNTER — Ambulatory Visit (HOSPITAL_BASED_OUTPATIENT_CLINIC_OR_DEPARTMENT_OTHER): Admitting: Anesthesiology

## 2024-02-26 ENCOUNTER — Encounter (HOSPITAL_COMMUNITY): Payer: Self-pay | Admitting: Ophthalmology

## 2024-02-26 ENCOUNTER — Ambulatory Visit (HOSPITAL_COMMUNITY)
Admission: RE | Admit: 2024-02-26 | Discharge: 2024-02-26 | Disposition: A | Discharge status: Home / Self Care | Attending: Ophthalmology | Admitting: Ophthalmology

## 2024-02-26 ENCOUNTER — Other Ambulatory Visit: Payer: Self-pay

## 2024-02-26 ENCOUNTER — Encounter (HOSPITAL_COMMUNITY)
Admission: RE | Disposition: A | Discharge status: Home / Self Care | Payer: Self-pay | Source: Home / Self Care | Attending: Ophthalmology

## 2024-02-26 DIAGNOSIS — H5711 Ocular pain, right eye: Secondary | ICD-10-CM | POA: Insufficient documentation

## 2024-02-26 DIAGNOSIS — Z6841 Body Mass Index (BMI) 40.0 and over, adult: Secondary | ICD-10-CM | POA: Insufficient documentation

## 2024-02-26 DIAGNOSIS — I10 Essential (primary) hypertension: Secondary | ICD-10-CM

## 2024-02-26 DIAGNOSIS — F418 Other specified anxiety disorders: Secondary | ICD-10-CM

## 2024-02-26 DIAGNOSIS — H25811 Combined forms of age-related cataract, right eye: Secondary | ICD-10-CM

## 2024-02-26 DIAGNOSIS — G4730 Sleep apnea, unspecified: Secondary | ICD-10-CM | POA: Diagnosis not present

## 2024-02-26 DIAGNOSIS — Z87891 Personal history of nicotine dependence: Secondary | ICD-10-CM | POA: Diagnosis not present

## 2024-02-26 DIAGNOSIS — E66813 Obesity, class 3: Secondary | ICD-10-CM | POA: Diagnosis not present

## 2024-02-26 DIAGNOSIS — H2511 Age-related nuclear cataract, right eye: Secondary | ICD-10-CM | POA: Diagnosis not present

## 2024-02-26 HISTORY — PX: CATARACT EXTRACTION W/PHACO: SHX586

## 2024-02-26 HISTORY — PX: INSERTION, STENT, DRUG-ELUTING, LACRIMAL CANALICULUS: SHX7453

## 2024-02-26 SURGERY — PHACOEMULSIFICATION, CATARACT, WITH IOL INSERTION
Anesthesia: Monitor Anesthesia Care | Site: Eye | Laterality: Right

## 2024-02-26 MED ORDER — LIDOCAINE HCL (PF) 1 % IJ SOLN
INTRAMUSCULAR | Status: DC | PRN
  Administered 2024-02-26: 1 mL

## 2024-02-26 MED ORDER — LACTATED RINGERS IV SOLN: INTRAVENOUS | Status: DC

## 2024-02-26 MED ORDER — MIDAZOLAM HCL 2 MG/2ML IJ SOLN
INTRAMUSCULAR | Status: DC | PRN
  Administered 2024-02-26: 2 mg via INTRAVENOUS

## 2024-02-26 MED ORDER — LIDOCAINE HCL 3.5 % OP GEL
1.0000 | Freq: Once | OPHTHALMIC | Status: AC
  Administered 2024-02-26: 1 via OPHTHALMIC

## 2024-02-26 MED ORDER — TROPICAMIDE 1 % OP SOLN
1.0000 [drp] | OPHTHALMIC | Status: AC | PRN
  Administered 2024-02-26 (×3): 1 [drp] via OPHTHALMIC

## 2024-02-26 MED ORDER — SODIUM CHLORIDE 0.9% FLUSH
INTRAVENOUS | Status: DC | PRN
  Administered 2024-02-26: 10 mL via INTRAVENOUS

## 2024-02-26 MED ORDER — SIGHTPATH DOSE#1 NA HYALUR & NA CHOND-NA HYALUR IO KIT
PACK | INTRAOCULAR | Status: DC | PRN
  Administered 2024-02-26: 1 via OPHTHALMIC

## 2024-02-26 MED ORDER — MIDAZOLAM HCL 2 MG/2ML IJ SOLN
INTRAMUSCULAR | Status: AC
  Filled 2024-02-26: qty 2

## 2024-02-26 MED ORDER — DEXAMETHASONE 0.4 MG OP INST
VAGINAL_INSERT | OPHTHALMIC | Status: DC | PRN
  Administered 2024-02-26: .4 mg via OPHTHALMIC

## 2024-02-26 MED ORDER — PHENYLEPHRINE-KETOROLAC 1-0.3 % IO SOLN
INTRAOCULAR | Status: DC | PRN
  Administered 2024-02-26: 500 mL via OPHTHALMIC

## 2024-02-26 MED ORDER — POVIDONE-IODINE 5 % OP SOLN
OPHTHALMIC | Status: DC | PRN
  Administered 2024-02-26: 1 via OPHTHALMIC

## 2024-02-26 MED ORDER — TETRACAINE HCL 0.5 % OP SOLN
1.0000 [drp] | OPHTHALMIC | Status: AC | PRN
  Administered 2024-02-26 (×3): 1 [drp] via OPHTHALMIC

## 2024-02-26 MED ORDER — PHENYLEPHRINE HCL 2.5 % OP SOLN
1.0000 [drp] | OPHTHALMIC | Status: AC | PRN
  Administered 2024-02-26 (×3): 1 [drp] via OPHTHALMIC

## 2024-02-26 MED ORDER — BSS IO SOLN
INTRAOCULAR | Status: DC | PRN
  Administered 2024-02-26: 15 mL via INTRAOCULAR

## 2024-02-26 MED ORDER — DEXAMETHASONE 0.4 MG OP INST
VAGINAL_INSERT | OPHTHALMIC | Status: AC
  Filled 2024-02-26: qty 1

## 2024-02-26 MED ORDER — MOXIFLOXACIN HCL 5 MG/ML IO SOLN
INTRAOCULAR | Status: DC | PRN
Start: 2024-02-26 — End: 2024-02-26
  Administered 2024-02-26: .3 mL via INTRACAMERAL

## 2024-02-26 MED ORDER — STERILE WATER FOR IRRIGATION IR SOLN
Status: DC | PRN
  Administered 2024-02-26: 1

## 2024-02-26 SURGICAL SUPPLY — 12 items
CATARACT SUITE SIGHTPATH (MISCELLANEOUS) ×1 IMPLANT
CLOTH BEACON ORANGE TIMEOUT ST (SAFETY) ×2 IMPLANT
EYE SHIELD UNIVERSAL CLEAR (GAUZE/BANDAGES/DRESSINGS) IMPLANT
FEE CATARACT SUITE SIGHTPATH (MISCELLANEOUS) ×2 IMPLANT
GLOVE BIOGEL PI IND STRL 7.0 (GLOVE) ×4 IMPLANT
LENS IOL TECNIS EYHANCE 13.0 (Intraocular Lens) IMPLANT
NDL HYPO 18GX1.5 BLUNT FILL (NEEDLE) ×2 IMPLANT
NEEDLE HYPO 18GX1.5 BLUNT FILL (NEEDLE) ×1 IMPLANT
PAD ARMBOARD POSITIONER FOAM (MISCELLANEOUS) ×2 IMPLANT
SYR TB 1ML LL NO SAFETY (SYRINGE) ×2 IMPLANT
TAPE PAPER 2X10 WHT MICROPORE (GAUZE/BANDAGES/DRESSINGS) IMPLANT
WATER STERILE IRR 250ML POUR (IV SOLUTION) ×2 IMPLANT

## 2024-02-26 NOTE — Interval H&P Note (Signed)
 History and Physical Interval Note:  02/26/2024 7:51 AM  Marcus Hutchinson  has presented today for surgery, with the diagnosis of combined forms age related cataract, right eye.  The various methods of treatment have been discussed with the patient and family. After consideration of risks, benefits and other options for treatment, the patient has consented to  Procedure(s): PHACOEMULSIFICATION, CATARACT, WITH IOL INSERTION (Right) INSERTION, STENT, DRUG-ELUTING, LACRIMAL CANALICULUS (Right) as a surgical intervention.  The patient's history has been reviewed, patient examined, no change in status, stable for surgery.  I have reviewed the patient's chart and labs.  Questions were answered to the patient's satisfaction.     HARRIE AGENT

## 2024-02-26 NOTE — Transfer of Care (Signed)
 Immediate Anesthesia Transfer of Care Note  Patient: Marcus Hutchinson  Procedure(s) Performed: PHACOEMULSIFICATION, CATARACT, WITH IOL INSERTION (Right: Eye) INSERTION, STENT, DRUG-ELUTING, LACRIMAL CANALICULUS (Right: Eye)  Patient Location: Short Stay  Anesthesia Type:MAC  Level of Consciousness: awake, alert , oriented, and patient cooperative  Airway & Oxygen Therapy: Patient Spontanous Breathing  Post-op Assessment: Report given to RN, Post -op Vital signs reviewed and stable, and Patient moving all extremities X 4  Post vital signs: Reviewed and stable  Last Vitals:  Vitals Value Taken Time  BP 135/80 02/26/24 08:20  Temp 37.2 C 02/26/24 08:20  Pulse 61 02/26/24 08:20  Resp 12 02/26/24 08:20  SpO2 100 % 02/26/24 08:20    Last Pain:  Vitals:   02/26/24 0820  TempSrc: Oral  PainSc: 0-No pain      Patients Stated Pain Goal: 2 (02/26/24 9341)  Complications: No notable events documented.

## 2024-02-26 NOTE — Discharge Instructions (Addendum)
 Please discharge patient when stable, will follow up today with Dr. June Leap at the [facility] office immediately following discharge.  Leave shield in place until visit.  All paperwork with discharge instructions will be given at the office.  [facility] Address:  [address]  [place], [place] [address]

## 2024-02-26 NOTE — Anesthesia Postprocedure Evaluation (Signed)
 Anesthesia Post Note  Patient: Marcus Hutchinson  Procedure(s) Performed: PHACOEMULSIFICATION, CATARACT, WITH IOL INSERTION (Right: Eye) INSERTION, STENT, DRUG-ELUTING, LACRIMAL CANALICULUS (Right: Eye)  Patient location during evaluation: Phase II Anesthesia Type: MAC Level of consciousness: awake and alert Pain management: pain level controlled Vital Signs Assessment: post-procedure vital signs reviewed and stable Respiratory status: spontaneous breathing, nonlabored ventilation and respiratory function stable Cardiovascular status: stable and blood pressure returned to baseline Postop Assessment: no apparent nausea or vomiting Anesthetic complications: no   There were no known notable events for this encounter.   Last Vitals:  Vitals:   02/26/24 0658 02/26/24 0820  BP: 127/88 135/80  Pulse: 64 61  Resp: 16 12  Temp: 36.7 C 37.2 C  SpO2: 100% 100%    Last Pain:  Vitals:   02/26/24 0820  TempSrc: Oral  PainSc: 0-No pain                 Mellonie Guess L Trude Cansler

## 2024-02-26 NOTE — Op Note (Signed)
 Date of procedure: 02/26/24  Pre-operative diagnosis:  Visually significant combined form age-related cataract, Right Eye (H25.811)  Post-operative diagnosis:   1. Visually significant combined form age-related cataract, Right Eye (H25.811) 2. Pain and inflammation following cataract surgery Right Eye (H57.11)  Procedure:  Removal of cataract via phacoemulsification and insertion of intra-ocular lens [employer] DIB00 +13.0D into the capsular bag of the Right Eye 2. Placement of Dextenza  insert, Right Eye  Attending surgeon: Lynwood LABOR. Annaly Skop, MD, MA  Anesthesia: MAC, Topical Akten  Complications: None  Estimated Blood Loss: <49mL (minimal)  Specimens: None  Implants: As above  Indications:  Visually significant age-related cataract, Right Eye  Procedure:  The patient was seen and identified in the pre-operative area. The operative eye was identified and dilated.  The operative eye was marked.  Topical anesthesia was administered to the operative eye.     The patient was then to the operative suite and placed in the supine position.  A timeout was performed confirming the patient, procedure to be performed, and all other relevant information.   The patient's face was prepped and draped in the usual fashion for intra-ocular surgery.  A lid speculum was placed into the operative eye and the surgical microscope moved into place and focused.  A superotemporal paracentesis was created using a 20 gauge paracentesis blade. Omidria was injected into the anterior chamber. Shugarcaine was injected into the anterior chamber.  Viscoelastic was injected into the anterior chamber.  A temporal clear-corneal main wound incision was created using a 2.35mm microkeratome.  A continuous curvilinear capsulorrhexis was initiated using an irrigating cystitome and completed using capsulorrhexis forceps.  Hydrodissection and hydrodeliniation were performed.  Viscoelastic was injected into the anterior  chamber.  A phacoemulsification handpiece and a chopper as a second instrument were used to remove the nucleus and epinucleus. The irrigation/aspiration handpiece was used to remove any remaining cortical material.   The capsular bag was reinflated with viscoelastic, checked, and found to be intact.  The intraocular lens was inserted into the capsular bag.  The irrigation/aspiration handpiece was used to remove any remaining viscoelastic.  The clear corneal wound and paracentesis wounds were then hydrated and checked with Weck-Cels to be watertight. 0.1mL of moxifloxacin was injected into the anterior chamber.  The lid-speculum was removed. The lower punctum was dilated. A Dextenza  implant was placed in the lower canaliculus without complication.  The drape was removed.  The patient's face was cleaned with a wet and dry 4x4. A clear shield was taped over the eye. The patient was taken to the post-operative care unit in good condition, having tolerated the procedure well.  Post-Op Instructions: The patient will follow up at [facility] for a same day post-operative evaluation and will receive all other orders and instructions.

## 2024-02-29 ENCOUNTER — Encounter (HOSPITAL_COMMUNITY): Payer: Self-pay | Admitting: Ophthalmology

## 2024-03-08 DIAGNOSIS — H25812 Combined forms of age-related cataract, left eye: Secondary | ICD-10-CM | POA: Diagnosis not present

## 2024-03-09 ENCOUNTER — Encounter (HOSPITAL_COMMUNITY): Payer: Self-pay

## 2024-03-09 ENCOUNTER — Encounter (HOSPITAL_COMMUNITY)
Admission: RE | Admit: 2024-03-09 | Discharge: 2024-03-09 | Disposition: A | Discharge status: Home / Self Care | Source: Ambulatory Visit | Attending: Ophthalmology | Admitting: Ophthalmology

## 2024-03-09 NOTE — H&P (Signed)
 Surgical History & Physical  Patient Name: Marcus Hutchinson  DOB: 1961-07-11  Surgery: Cataract extraction with intraocular lens implant phacoemulsification; Left Eye Surgeon: Lynwood Hermann MD Surgery Date: 03/11/2024 Pre-Op Date: 03/08/2024  HPI: A 51 Yr. old male patient Returns for 10 day PO OD PCIOL/preop OS. LEE 02-26-24 with Dr. Hermann. Vision is improving OD since sx. Combo BID OD, LI last night. Denies eye pain, new floaters, flashes, diplopia. Wishes to proceed with OS sx, c/o: glare day and night, poor night VA, hazy/blurry VA, trouble recognizing ppl at distance and seeing captions on TV. This is negatively affecting the patient's quality of life and the patient is unable to function adequately in life with the current level of vision. HPI Completed by Dr. Lynwood Hermann  Medical History: Cataracts  Depression, Vit. D deficiency, Neuropathy High Blood Pressure  Review of Systems Cardiovascular High Blood Pressure Neurological Neuropathy Psychiatry Depression All recorded systems are negative except as noted above.  Social Unknown if ever smoked  Medication Dry Eye Relief, Prednisolone-moxiflox-bromfen,  Colchicine, Metoprolol  succinate, Oseltamivir, Cefuroxime axetil, Bupropion  HCl, Prednisolone acetate, Ilevro, Moxifloxacin   Sx/Procedures Phaco c IOL OD- dextenza ,  Back Surgery, Gastric Bypass, Shoulder surgery, Hip Replacement  Drug Allergies  NKDA  History & Physical: Heent: cataract NECK: supple without bruits LUNGS: lungs clear to auscultation CV: regular rate and rhythm Abdomen: soft and non-tender  Impression & Plan: Assessment: 1.  CATARACT EXTRACTION STATUS; Right Eye (Z98.41) 2.  COMBINED FORMS AGE RELATED CATARACT; Left Eye (H25.812)  Plan: 1.  2 weeks after cataract surgery. Doing well with improved vision and normal eye pressure. Call with any problems or concerns. Continue Pred-Mox-Brom Combo drop 2x/day for 4 more days.  2.  Cataract accounts  for the patient's decreased vision. This visual impairment is not correctable with a tolerable change in glasses or contact lenses. Cataract surgery with an implantation of a new lens should significantly improve the visual and functional status of the patient. Discussed all risks, benefits, alternatives, and potential complications. Discussed the procedures and recovery. Patient desires to have surgery. A-scan ordered and performed today for intra-ocular lens calculations. The surgery will be performed in order to improve vision for driving, reading, and for eye examinations. Recommend phacoemulsification with intra-ocular lens. Recommend Dextenza  for post-operative pain and inflammation. History of refractive Surgery: None Use of Eye Pressure Lowering Drops: None Left Eye. Surgery required to correct imbalance of vision. Dilates well - shugarcaine or Lidocaine +Omidira by protocol. Fuchs - Use DuoVisc instead of Healon 5

## 2024-03-11 ENCOUNTER — Encounter (HOSPITAL_COMMUNITY)
Admission: RE | Disposition: A | Discharge status: Home / Self Care | Payer: Self-pay | Source: Home / Self Care | Attending: Ophthalmology

## 2024-03-11 ENCOUNTER — Ambulatory Visit (HOSPITAL_COMMUNITY): Admitting: Anesthesiology

## 2024-03-11 ENCOUNTER — Encounter (HOSPITAL_COMMUNITY): Payer: Self-pay | Admitting: Ophthalmology

## 2024-03-11 ENCOUNTER — Ambulatory Visit (HOSPITAL_COMMUNITY)
Admission: RE | Admit: 2024-03-11 | Discharge: 2024-03-11 | Disposition: A | Discharge status: Home / Self Care | Attending: Ophthalmology | Admitting: Ophthalmology

## 2024-03-11 DIAGNOSIS — H25812 Combined forms of age-related cataract, left eye: Secondary | ICD-10-CM | POA: Diagnosis not present

## 2024-03-11 DIAGNOSIS — I10 Essential (primary) hypertension: Secondary | ICD-10-CM | POA: Insufficient documentation

## 2024-03-11 DIAGNOSIS — Z87891 Personal history of nicotine dependence: Secondary | ICD-10-CM | POA: Diagnosis not present

## 2024-03-11 DIAGNOSIS — M1990 Unspecified osteoarthritis, unspecified site: Secondary | ICD-10-CM | POA: Insufficient documentation

## 2024-03-11 DIAGNOSIS — G4730 Sleep apnea, unspecified: Secondary | ICD-10-CM | POA: Insufficient documentation

## 2024-03-11 DIAGNOSIS — F32A Depression, unspecified: Secondary | ICD-10-CM | POA: Diagnosis not present

## 2024-03-11 DIAGNOSIS — Z9841 Cataract extraction status, right eye: Secondary | ICD-10-CM | POA: Diagnosis not present

## 2024-03-11 DIAGNOSIS — Z6841 Body Mass Index (BMI) 40.0 and over, adult: Secondary | ICD-10-CM

## 2024-03-11 DIAGNOSIS — K2270 Barrett's esophagus without dysplasia: Secondary | ICD-10-CM | POA: Diagnosis not present

## 2024-03-11 DIAGNOSIS — H5712 Ocular pain, left eye: Secondary | ICD-10-CM | POA: Diagnosis not present

## 2024-03-11 DIAGNOSIS — F419 Anxiety disorder, unspecified: Secondary | ICD-10-CM | POA: Insufficient documentation

## 2024-03-11 DIAGNOSIS — E66813 Obesity, class 3: Secondary | ICD-10-CM | POA: Diagnosis not present

## 2024-03-11 DIAGNOSIS — H2512 Age-related nuclear cataract, left eye: Secondary | ICD-10-CM | POA: Diagnosis not present

## 2024-03-11 HISTORY — PX: INSERTION, STENT, DRUG-ELUTING, LACRIMAL CANALICULUS: SHX7453

## 2024-03-11 HISTORY — PX: CATARACT EXTRACTION W/PHACO: SHX586

## 2024-03-11 SURGERY — PHACOEMULSIFICATION, CATARACT, WITH IOL INSERTION
Anesthesia: Monitor Anesthesia Care | Site: Eye | Laterality: Left

## 2024-03-11 MED ORDER — TROPICAMIDE 1 % OP SOLN
1.0000 [drp] | OPHTHALMIC | Status: AC | PRN
  Administered 2024-03-11 (×3): 1 [drp] via OPHTHALMIC

## 2024-03-11 MED ORDER — PHENYLEPHRINE HCL 2.5 % OP SOLN
1.0000 [drp] | OPHTHALMIC | Status: AC | PRN
  Administered 2024-03-11 (×3): 1 [drp] via OPHTHALMIC

## 2024-03-11 MED ORDER — STERILE WATER FOR IRRIGATION IR SOLN
Status: DC | PRN
  Administered 2024-03-11: 1

## 2024-03-11 MED ORDER — SIGHTPATH DOSE#1 NA HYALUR & NA CHOND-NA HYALUR IO KIT
PACK | INTRAOCULAR | Status: DC | PRN
  Administered 2024-03-11: 1 via OPHTHALMIC

## 2024-03-11 MED ORDER — SODIUM HYALURONATE 10 MG/ML IO SOLUTION: PREFILLED_SYRINGE | INTRAOCULAR | Status: DC | PRN

## 2024-03-11 MED ORDER — BSS IO SOLN
INTRAOCULAR | Status: DC | PRN
  Administered 2024-03-11: 15 mL via INTRAOCULAR

## 2024-03-11 MED ORDER — LACTATED RINGERS IV SOLN: INTRAVENOUS | Status: DC

## 2024-03-11 MED ORDER — LIDOCAINE HCL (PF) 1 % IJ SOLN
INTRAMUSCULAR | Status: DC | PRN
  Administered 2024-03-11: 1 mL

## 2024-03-11 MED ORDER — LIDOCAINE HCL 3.5 % OP GEL
1.0000 | Freq: Once | OPHTHALMIC | Status: AC
  Administered 2024-03-11: 1 via OPHTHALMIC

## 2024-03-11 MED ORDER — POVIDONE-IODINE 5 % OP SOLN
OPHTHALMIC | Status: DC | PRN
  Administered 2024-03-11: 1 via OPHTHALMIC

## 2024-03-11 MED ORDER — MIDAZOLAM HCL 2 MG/2ML IJ SOLN
INTRAMUSCULAR | Status: AC
Start: 2024-03-11 — End: 2024-03-11
  Filled 2024-03-11: qty 2

## 2024-03-11 MED ORDER — SODIUM CHLORIDE 0.9% FLUSH
INTRAVENOUS | Status: DC | PRN
Start: 2024-03-11 — End: 2024-03-11
  Administered 2024-03-11: 10 mL via INTRAVENOUS

## 2024-03-11 MED ORDER — DEXAMETHASONE 0.4 MG OP INST
VAGINAL_INSERT | OPHTHALMIC | Status: DC | PRN
  Administered 2024-03-11: .4 mg via OPHTHALMIC

## 2024-03-11 MED ORDER — DEXAMETHASONE 0.4 MG OP INST
VAGINAL_INSERT | OPHTHALMIC | Status: AC
  Filled 2024-03-11: qty 1

## 2024-03-11 MED ORDER — MIDAZOLAM HCL 2 MG/2ML IJ SOLN
INTRAMUSCULAR | Status: DC | PRN
  Administered 2024-03-11: 2 mg via INTRAVENOUS

## 2024-03-11 MED ORDER — MOXIFLOXACIN HCL 5 MG/ML IO SOLN
INTRAOCULAR | Status: DC | PRN
Start: 2024-03-11 — End: 2024-03-11
  Administered 2024-03-11: .2 mL via INTRACAMERAL

## 2024-03-11 MED ORDER — TETRACAINE HCL 0.5 % OP SOLN
1.0000 [drp] | OPHTHALMIC | Status: AC | PRN
  Administered 2024-03-11 (×3): 1 [drp] via OPHTHALMIC

## 2024-03-11 MED ORDER — PHENYLEPHRINE-KETOROLAC 1-0.3 % IO SOLN
INTRAOCULAR | Status: DC | PRN
  Administered 2024-03-11: 500 mL via OPHTHALMIC

## 2024-03-11 SURGICAL SUPPLY — 12 items
CATARACT SUITE SIGHTPATH (MISCELLANEOUS) ×1 IMPLANT
CLOTH BEACON ORANGE TIMEOUT ST (SAFETY) ×2 IMPLANT
EYE SHIELD UNIVERSAL CLEAR (GAUZE/BANDAGES/DRESSINGS) IMPLANT
FEE CATARACT SUITE SIGHTPATH (MISCELLANEOUS) ×2 IMPLANT
GLOVE BIOGEL PI IND STRL 7.0 (GLOVE) ×4 IMPLANT
LENS IOL TECNIS EYHANCE 13.0 (Intraocular Lens) IMPLANT
NDL HYPO 18GX1.5 BLUNT FILL (NEEDLE) ×2 IMPLANT
NEEDLE HYPO 18GX1.5 BLUNT FILL (NEEDLE) ×1 IMPLANT
PAD ARMBOARD POSITIONER FOAM (MISCELLANEOUS) ×2 IMPLANT
SYR TB 1ML LL NO SAFETY (SYRINGE) ×2 IMPLANT
TAPE PAPER 2X10 WHT MICROPORE (GAUZE/BANDAGES/DRESSINGS) IMPLANT
WATER STERILE IRR 250ML POUR (IV SOLUTION) ×2 IMPLANT

## 2024-03-11 NOTE — Interval H&P Note (Signed)
 History and Physical Interval Note:  03/11/2024 10:44 AM  Marcus Hutchinson  has presented today for surgery, with the diagnosis of combined forms age related cataract, left eye.  The various methods of treatment have been discussed with the patient and family. After consideration of risks, benefits and other options for treatment, the patient has consented to  Procedure(s): PHACOEMULSIFICATION, CATARACT, WITH IOL INSERTION (Left) INSERTION, STENT, DRUG-ELUTING, LACRIMAL CANALICULUS (Left) as a surgical intervention.  The patient's history has been reviewed, patient examined, no change in status, stable for surgery.  I have reviewed the patient's chart and labs.  Questions were answered to the patient's satisfaction.     HARRIE AGENT

## 2024-03-11 NOTE — Op Note (Signed)
 Date of procedure: 03/11/24  Pre-operative diagnosis: Visually significant age-related combined cataract, Left Eye (H25.812)  Post-operative diagnosis:  Visually significant age-related combined cataract, Left Eye (H25.812) 2.   Pain and inflammation following cataract surgery, Left Eye (H57.12)  Procedure:  Removal of cataract via phacoemulsification and insertion of intra-ocular lens Johnson and Johnson DIB00 +13.0D into the capsular bag of the Left Eye 2. Placement of Dextenza  Implant, Left Lower Lid  Attending surgeon: Lynwood LABOR. Shaianne Nucci, MD, MA  Anesthesia: MAC, Topical Akten   Complications: None  Estimated Blood Loss: <10mL (minimal)  Specimens: None  Implants: As above  Indications:  Visually significant age-related cataract, Left Eye  Procedure:  The patient was seen and identified in the pre-operative area. The operative eye was identified and dilated.  The operative eye was marked.  Topical anesthesia was administered to the operative eye.     The patient was then to the operative suite and placed in the supine position.  A timeout was performed confirming the patient, procedure to be performed, and all other relevant information.   The patient's face was prepped and draped in the usual fashion for intra-ocular surgery.  A lid speculum was placed into the operative eye and the surgical microscope moved into place and focused.  An inferotemporal paracentesis was created using a 20 gauge paracentesis blade. Omidria  was injected into the anterior chamber. Shugarcaine was injected into the anterior chamber.  Viscoelastic was injected into the anterior chamber.  A temporal clear-corneal main wound incision was created using a 2.23mm microkeratome.  A continuous curvilinear capsulorrhexis was initiated using an irrigating cystitome and completed using capsulorrhexis forceps.  Hydrodissection and hydrodeliniation were performed.  Viscoelastic was injected into the anterior chamber.  A  phacoemulsification handpiece and a chopper as a second instrument were used to remove the nucleus and epinucleus. The irrigation/aspiration handpiece was used to remove any remaining cortical material.   The capsular bag was reinflated with viscoelastic, checked, and found to be intact.  The intraocular lens was inserted into the capsular bag.  The irrigation/aspiration handpiece was used to remove any remaining viscoelastic.  The clear corneal wound and paracentesis wounds were then hydrated and checked with Weck-Cels to be watertight.  0.1mL of moxifloxacin  was injected into the anterior chamber.  The lid-speculum was removed. The lower punctum was dilated. A Dextenza  implant was placed in the lower canaliculus without complication.   The drape was removed.  The patient's face was cleaned with a wet and dry 4x4.   A clear shield was taped over the eye. The patient was taken to the post-operative care unit in good condition, having tolerated the procedure well.  Post-Op Instructions: The patient will follow up at [facility] for a same day post-operative evaluation and will receive all other orders and instructions.

## 2024-03-11 NOTE — Transfer of Care (Signed)
 Immediate Anesthesia Transfer of Care Note  Patient: Marcus Hutchinson  Procedure(s) Performed: PHACOEMULSIFICATION, CATARACT, WITH IOL INSERTION (Left: Eye) INSERTION, STENT, DRUG-ELUTING, LACRIMAL CANALICULUS (Left: Eye)  Patient Location: Short Stay  Anesthesia Type:MAC  Level of Consciousness: awake, alert , and oriented  Airway & Oxygen Therapy: Patient Spontanous Breathing  Post-op Assessment: Report given to RN and Post -op Vital signs reviewed and stable  Post vital signs: Reviewed and stable  Last Vitals:  Vitals Value Taken Time  BP 121/89 03/11/24 11:08  Temp 37 C 03/11/24 11:08  Pulse 56 03/11/24 11:08  Resp    SpO2 96 % 03/11/24 11:08    Last Pain:  Vitals:   03/11/24 1108  TempSrc: Oral  PainSc: 0-No pain         Complications: No notable events documented.

## 2024-03-11 NOTE — Anesthesia Postprocedure Evaluation (Signed)
 Anesthesia Post Note  Patient: Marcus Hutchinson  Procedure(s) Performed: PHACOEMULSIFICATION, CATARACT, WITH IOL INSERTION (Left: Eye) INSERTION, STENT, DRUG-ELUTING, LACRIMAL CANALICULUS (Left: Eye)  Patient location during evaluation: Phase II Anesthesia Type: MAC Level of consciousness: awake Pain management: pain level controlled Vital Signs Assessment: post-procedure vital signs reviewed and stable Respiratory status: spontaneous breathing and respiratory function stable Cardiovascular status: blood pressure returned to baseline and stable Postop Assessment: no headache and no apparent nausea or vomiting Anesthetic complications: no Comments: Late entry   No notable events documented.   Last Vitals:  Vitals:   03/11/24 1015 03/11/24 1108  BP: (!) 131/94 121/89  Pulse: (!) 50 (!) 56  Resp: 16   Temp:  37 C  SpO2: 99% 96%    Last Pain:  Vitals:   03/11/24 1108  TempSrc: Oral  PainSc: 0-No pain                 Yvonna JINNY Bosworth

## 2024-03-11 NOTE — Discharge Instructions (Addendum)
 Please discharge patient when stable, will follow up today with Dr. June Leap at the [facility] office immediately following discharge.  Leave shield in place until visit.  All paperwork with discharge instructions will be given at the office.  [facility] Address:  [address]  [place], [place] [address]

## 2024-03-11 NOTE — Anesthesia Preprocedure Evaluation (Signed)
 Anesthesia Evaluation  Patient identified by MRN, date of birth, ID band Patient awake    Reviewed: Allergy & Precautions, NPO status , Patient's Chart, lab work & pertinent test results  History of Anesthesia Complications Negative for: history of anesthetic complications  Airway Mallampati: III  TM Distance: >3 FB Neck ROM: Full    Dental no notable dental hx. (+) Dental Advisory Given, Teeth Intact   Pulmonary sleep apnea , former smoker   Pulmonary exam normal breath sounds clear to auscultation       Cardiovascular hypertension, Pt. on medications and Pt. on home beta blockers Normal cardiovascular exam Rhythm:Regular Rate:Normal     Neuro/Psych  PSYCHIATRIC DISORDERS Anxiety Depression       GI/Hepatic Barretts   Endo/Other    Class 3 obesity  Renal/GU Renal InsufficiencyRenal disease     Musculoskeletal  (+) Arthritis ,    Abdominal   Peds  Hematology negative hematology ROS (+)   Anesthesia Other Findings   Reproductive/Obstetrics                              Anesthesia Physical Anesthesia Plan  ASA: 3  Anesthesia Plan: MAC   Post-op Pain Management: Minimal or no pain anticipated   Induction:   PONV Risk Score and Plan:   Airway Management Planned: Nasal Cannula and Natural Airway  Additional Equipment: None  Intra-op Plan:   Post-operative Plan:   Informed Consent: I have reviewed the patients History and Physical, chart, labs and discussed the procedure including the risks, benefits and alternatives for the proposed anesthesia with the patient or authorized representative who has indicated his/her understanding and acceptance.     Dental advisory given  Plan Discussed with: CRNA  Anesthesia Plan Comments:          Anesthesia Quick Evaluation

## 2024-03-14 ENCOUNTER — Encounter (HOSPITAL_COMMUNITY): Payer: Self-pay | Admitting: Ophthalmology

## 2024-04-27 DIAGNOSIS — Z713 Dietary counseling and surveillance: Secondary | ICD-10-CM | POA: Diagnosis not present

## 2024-04-27 DIAGNOSIS — Z1389 Encounter for screening for other disorder: Secondary | ICD-10-CM | POA: Diagnosis not present

## 2024-04-27 DIAGNOSIS — Z299 Encounter for prophylactic measures, unspecified: Secondary | ICD-10-CM | POA: Diagnosis not present

## 2024-04-27 DIAGNOSIS — Z0000 Encounter for general adult medical examination without abnormal findings: Secondary | ICD-10-CM | POA: Diagnosis not present

## 2024-04-27 DIAGNOSIS — E7800 Pure hypercholesterolemia, unspecified: Secondary | ICD-10-CM | POA: Diagnosis not present

## 2024-04-27 DIAGNOSIS — I10 Essential (primary) hypertension: Secondary | ICD-10-CM | POA: Diagnosis not present

## 2024-04-27 DIAGNOSIS — E039 Hypothyroidism, unspecified: Secondary | ICD-10-CM | POA: Diagnosis not present

## 2024-04-27 DIAGNOSIS — Z7189 Other specified counseling: Secondary | ICD-10-CM | POA: Diagnosis not present

## 2024-05-03 DIAGNOSIS — E039 Hypothyroidism, unspecified: Secondary | ICD-10-CM | POA: Diagnosis not present

## 2024-05-03 DIAGNOSIS — Z299 Encounter for prophylactic measures, unspecified: Secondary | ICD-10-CM | POA: Diagnosis not present

## 2024-05-03 DIAGNOSIS — I10 Essential (primary) hypertension: Secondary | ICD-10-CM | POA: Diagnosis not present
